# Patient Record
Sex: Female | Born: 1950 | Race: Black or African American | Hispanic: No | Marital: Married | Smoking: Never smoker
Health system: Southern US, Community
[De-identification: ages and names within clinical notes are randomized; demographics above are authoritative.]

## PROBLEM LIST (undated history)

## (undated) DIAGNOSIS — I10 Essential (primary) hypertension: Secondary | ICD-10-CM

## (undated) DIAGNOSIS — N289 Disorder of kidney and ureter, unspecified: Secondary | ICD-10-CM

## (undated) DIAGNOSIS — M549 Dorsalgia, unspecified: Secondary | ICD-10-CM

## (undated) DIAGNOSIS — M2550 Pain in unspecified joint: Secondary | ICD-10-CM

## (undated) DIAGNOSIS — G4730 Sleep apnea, unspecified: Secondary | ICD-10-CM

## (undated) DIAGNOSIS — R0602 Shortness of breath: Secondary | ICD-10-CM

## (undated) DIAGNOSIS — J45909 Unspecified asthma, uncomplicated: Secondary | ICD-10-CM

## (undated) DIAGNOSIS — M25569 Pain in unspecified knee: Secondary | ICD-10-CM

## (undated) DIAGNOSIS — T8859XA Other complications of anesthesia, initial encounter: Secondary | ICD-10-CM

## (undated) DIAGNOSIS — M1990 Unspecified osteoarthritis, unspecified site: Secondary | ICD-10-CM

## (undated) DIAGNOSIS — E669 Obesity, unspecified: Secondary | ICD-10-CM

## (undated) DIAGNOSIS — D869 Sarcoidosis, unspecified: Secondary | ICD-10-CM

## (undated) DIAGNOSIS — R0600 Dyspnea, unspecified: Secondary | ICD-10-CM

## (undated) DIAGNOSIS — R011 Cardiac murmur, unspecified: Secondary | ICD-10-CM

## (undated) DIAGNOSIS — T7840XA Allergy, unspecified, initial encounter: Secondary | ICD-10-CM

## (undated) HISTORY — DX: Dorsalgia, unspecified: M54.9

## (undated) HISTORY — DX: Obesity, unspecified: E66.9

## (undated) HISTORY — PX: CHOLECYSTECTOMY: SHX55

## (undated) HISTORY — DX: Disorder of kidney and ureter, unspecified: N28.9

## (undated) HISTORY — PX: ABDOMINAL HYSTERECTOMY: SHX81

## (undated) HISTORY — DX: Pain in unspecified knee: M25.569

## (undated) HISTORY — DX: Sleep apnea, unspecified: G47.30

## (undated) HISTORY — DX: Allergy, unspecified, initial encounter: T78.40XA

## (undated) HISTORY — DX: Unspecified osteoarthritis, unspecified site: M19.90

## (undated) HISTORY — DX: Pain in unspecified joint: M25.50

## (undated) HISTORY — PX: KNEE SURGERY: SHX244

## (undated) HISTORY — PX: BUNIONECTOMY: SHX129

## (undated) HISTORY — DX: Shortness of breath: R06.02

---

## 1999-08-04 ENCOUNTER — Ambulatory Visit (HOSPITAL_COMMUNITY): Admission: RE | Admit: 1999-08-04 | Discharge: 1999-08-04 | Payer: Self-pay | Admitting: Internal Medicine

## 1999-08-05 ENCOUNTER — Encounter: Payer: Self-pay | Admitting: Internal Medicine

## 2000-03-24 ENCOUNTER — Encounter: Admission: RE | Admit: 2000-03-24 | Discharge: 2000-03-24 | Payer: Self-pay | Admitting: Obstetrics & Gynecology

## 2000-03-24 ENCOUNTER — Encounter: Payer: Self-pay | Admitting: Obstetrics & Gynecology

## 2000-05-30 ENCOUNTER — Other Ambulatory Visit: Admission: RE | Admit: 2000-05-30 | Discharge: 2000-05-30 | Payer: Self-pay | Admitting: Obstetrics & Gynecology

## 2000-06-13 ENCOUNTER — Encounter (INDEPENDENT_AMBULATORY_CARE_PROVIDER_SITE_OTHER): Payer: Self-pay

## 2000-06-13 ENCOUNTER — Other Ambulatory Visit: Admission: RE | Admit: 2000-06-13 | Discharge: 2000-06-13 | Payer: Self-pay | Admitting: *Deleted

## 2000-09-09 ENCOUNTER — Encounter: Admission: RE | Admit: 2000-09-09 | Discharge: 2000-09-09 | Payer: Self-pay | Admitting: Internal Medicine

## 2000-09-09 ENCOUNTER — Encounter: Payer: Self-pay | Admitting: Internal Medicine

## 2000-09-21 ENCOUNTER — Encounter (INDEPENDENT_AMBULATORY_CARE_PROVIDER_SITE_OTHER): Payer: Self-pay

## 2000-09-21 ENCOUNTER — Encounter: Payer: Self-pay | Admitting: Obstetrics & Gynecology

## 2000-09-21 ENCOUNTER — Inpatient Hospital Stay (HOSPITAL_COMMUNITY): Admission: RE | Admit: 2000-09-21 | Discharge: 2000-09-24 | Payer: Self-pay | Admitting: *Deleted

## 2002-04-13 ENCOUNTER — Other Ambulatory Visit: Admission: RE | Admit: 2002-04-13 | Discharge: 2002-04-13 | Payer: Self-pay | Admitting: Obstetrics and Gynecology

## 2002-06-18 ENCOUNTER — Encounter: Admission: RE | Admit: 2002-06-18 | Discharge: 2002-06-18 | Payer: Self-pay | Admitting: Internal Medicine

## 2002-06-18 ENCOUNTER — Encounter: Payer: Self-pay | Admitting: Internal Medicine

## 2003-12-16 ENCOUNTER — Other Ambulatory Visit: Admission: RE | Admit: 2003-12-16 | Discharge: 2003-12-16 | Payer: Self-pay | Admitting: Obstetrics and Gynecology

## 2004-01-02 ENCOUNTER — Encounter: Admission: RE | Admit: 2004-01-02 | Discharge: 2004-01-02 | Payer: Self-pay | Admitting: Obstetrics and Gynecology

## 2004-10-19 ENCOUNTER — Encounter: Admission: RE | Admit: 2004-10-19 | Discharge: 2004-10-19 | Payer: Self-pay | Admitting: Internal Medicine

## 2004-11-12 ENCOUNTER — Encounter: Admission: RE | Admit: 2004-11-12 | Discharge: 2004-11-12 | Payer: Self-pay | Admitting: Internal Medicine

## 2005-03-05 ENCOUNTER — Other Ambulatory Visit: Admission: RE | Admit: 2005-03-05 | Discharge: 2005-03-05 | Payer: Self-pay | Admitting: Obstetrics and Gynecology

## 2006-03-08 ENCOUNTER — Other Ambulatory Visit: Admission: RE | Admit: 2006-03-08 | Discharge: 2006-03-08 | Payer: Self-pay | Admitting: Obstetrics and Gynecology

## 2007-07-18 ENCOUNTER — Encounter: Admission: RE | Admit: 2007-07-18 | Discharge: 2007-07-18 | Payer: Self-pay | Admitting: Internal Medicine

## 2007-09-29 ENCOUNTER — Encounter: Admission: RE | Admit: 2007-09-29 | Discharge: 2007-09-29 | Payer: Self-pay | Admitting: Internal Medicine

## 2008-11-19 ENCOUNTER — Encounter: Admission: RE | Admit: 2008-11-19 | Discharge: 2008-11-19 | Payer: Self-pay | Admitting: Internal Medicine

## 2008-11-22 ENCOUNTER — Ambulatory Visit: Payer: Self-pay | Admitting: Internal Medicine

## 2009-04-12 ENCOUNTER — Emergency Department (HOSPITAL_COMMUNITY): Admission: EM | Admit: 2009-04-12 | Discharge: 2009-04-12 | Payer: Self-pay | Admitting: Emergency Medicine

## 2009-11-20 ENCOUNTER — Encounter: Admission: RE | Admit: 2009-11-20 | Discharge: 2009-11-20 | Payer: Self-pay | Admitting: Internal Medicine

## 2009-12-25 ENCOUNTER — Ambulatory Visit: Payer: Self-pay | Admitting: Internal Medicine

## 2010-12-06 ENCOUNTER — Encounter: Payer: Self-pay | Admitting: Internal Medicine

## 2010-12-11 ENCOUNTER — Other Ambulatory Visit: Payer: Self-pay | Admitting: Internal Medicine

## 2010-12-11 DIAGNOSIS — Z1239 Encounter for other screening for malignant neoplasm of breast: Secondary | ICD-10-CM

## 2010-12-11 DIAGNOSIS — Z1231 Encounter for screening mammogram for malignant neoplasm of breast: Secondary | ICD-10-CM

## 2010-12-23 ENCOUNTER — Ambulatory Visit
Admission: RE | Admit: 2010-12-23 | Discharge: 2010-12-23 | Disposition: A | Discharge status: Home / Self Care | Payer: BC Managed Care – PPO | Source: Ambulatory Visit | Attending: Internal Medicine | Admitting: Internal Medicine

## 2010-12-23 DIAGNOSIS — Z1231 Encounter for screening mammogram for malignant neoplasm of breast: Secondary | ICD-10-CM

## 2011-01-25 ENCOUNTER — Ambulatory Visit: Payer: BC Managed Care – PPO | Admitting: Internal Medicine

## 2011-01-25 DIAGNOSIS — J209 Acute bronchitis, unspecified: Secondary | ICD-10-CM

## 2011-02-03 ENCOUNTER — Other Ambulatory Visit: Payer: Self-pay | Admitting: Internal Medicine

## 2011-02-03 ENCOUNTER — Ambulatory Visit
Admission: RE | Admit: 2011-02-03 | Discharge: 2011-02-03 | Disposition: A | Discharge status: Home / Self Care | Payer: BC Managed Care – PPO | Source: Ambulatory Visit | Attending: Internal Medicine | Admitting: Internal Medicine

## 2011-02-03 DIAGNOSIS — D869 Sarcoidosis, unspecified: Secondary | ICD-10-CM

## 2011-02-23 LAB — URINALYSIS, ROUTINE W REFLEX MICROSCOPIC
Bilirubin Urine: NEGATIVE
Ketones, ur: NEGATIVE mg/dL
Nitrite: NEGATIVE
Protein, ur: NEGATIVE mg/dL
pH: 5.5 (ref 5.0–8.0)

## 2011-02-23 LAB — DIFFERENTIAL
Basophils Absolute: 0 10*3/uL (ref 0.0–0.1)
Basophils Relative: 1 % (ref 0–1)
Eosinophils Absolute: 0.2 10*3/uL (ref 0.0–0.7)
Eosinophils Relative: 2 % (ref 0–5)
Neutrophils Relative %: 66 % (ref 43–77)

## 2011-02-23 LAB — CBC
HCT: 36.9 % (ref 36.0–46.0)
MCV: 78.2 fL (ref 78.0–100.0)
Platelets: 255 10*3/uL (ref 150–400)
RDW: 14.7 % (ref 11.5–15.5)
WBC: 8.1 10*3/uL (ref 4.0–10.5)

## 2011-02-23 LAB — BASIC METABOLIC PANEL
BUN: 20 mg/dL (ref 6–23)
Chloride: 108 mEq/L (ref 96–112)
Creatinine, Ser: 0.95 mg/dL (ref 0.4–1.2)
Glucose, Bld: 133 mg/dL — ABNORMAL HIGH (ref 70–99)
Potassium: 3.8 mEq/L (ref 3.5–5.1)

## 2011-03-23 ENCOUNTER — Telehealth: Payer: Self-pay | Admitting: Internal Medicine

## 2011-03-23 NOTE — Telephone Encounter (Signed)
Pt will not be able to get in as new pt for several weeks. Vaughan Basta will get a referral. Has been 3 years since seen there at [facility].

## 2011-03-26 ENCOUNTER — Telehealth: Payer: Self-pay | Admitting: Internal Medicine

## 2011-03-26 NOTE — Telephone Encounter (Signed)
Noted and referral sent to [facility] neuro

## 2011-03-26 NOTE — Telephone Encounter (Signed)
Noted and referral sent

## 2011-04-02 NOTE — H&P (Signed)
[facility]  Patient:    Kristine [name], Kristine [name]                      MRN: [id number] Attending:  Peterson Lombard, M.D.                         History and Physical  DATE OF BIRTH:                October 20, 1951.  HISTORY:                      Patient is a 60 year old gravida 2, para 2, who has a rapidly enlarged fibroid uterus.  Patient was seen for pelvic examination in December 1999 and was noted to have a normal-size uterus.  The patient noticed since December that her periods have become heavier.  She has a two-day period where she must change a pad every hour.  She has had two accidents while in church.  Her periods are also lasting a total of seven to eight days, which is new for her.  Patient was evaluated and found to have an enlarged fibroid uterus, approximately 5 cm above the umbilicus.  Patient was initially scheduled for total abdominal hysterectomy in September.  Patient had bronchitis at that time and secondary to her history of sarcoidosis, her surgery was rescheduled to ______  7th.  ALLERGIES:                    Patient had reaction to PENICILLIN prior to the birth of her children.  She states that her hands "felt hot and peeled."  MEDICATIONS:                  Prinzide for hypertension and Meridia for weight loss.  PAST MEDICAL HISTORY:         Medical illnesses include hypertension and sarcoidosis.  PAST SURGICAL HISTORY:        Cholecystectomy and tubal ligation.  FAMILY HISTORY:               Patients brother had hypertension; he died two years ago of liver cancer.  Patients mother has had lung cancer and breast cancer; patients father also has lung cancer.  SOCIAL HISTORY:               Patient denies tobacco, alcohol or recreational drug use.  She is in a stable marital relationship.  REVIEW OF SYSTEMS:            Noncontributory.  PHYSICAL EXAMINATION  VITAL SIGNS:                  Vital signs are stable.  Weight  243 pounds. Height 5 feet 7 inches.  HEENT:                        Within normal limits.  LUNGS:                        Clear to auscultation bilaterally.  HEART:                        Regular rate and rhythm.  ABDOMEN:                      Obese, soft, nontender, with a mass palpated 5  cm above the umbilicus.  BACK:                         No CVA tenderness.  BREASTS:                      No dominant masses, discharge, skin changes or nipple retraction.  EXTREMITIES:                  No clubbing, cyanosis, or edema.  PELVIC:                       Normal external female genitalia.  Vagina is without discharge.  Cervix without lesion.  Uterus is well above the umbilicus, mobile and irregular.  Adnexa difficult to palpate secondary to enlarged uterus.  RECTAL:                       Confirms.  LABORATORY DATA:              TSH is within normal limits.  Prolactin is 16.7. Endometrial biopsy showed proliferative-type endometrium.  GYN ultrasound showed multiple fibroids, with the largest measuring 6.2 cm at the fundus.  Neither ovary could be seen.  Endometrial lining was 1.2 cm.  Mammogram from May 2001 within normal limits.  Pap smear from July 2001 was satisfactory but limited by the absence of an endocervical component.  Hemoglobin from September 10th was 11.4, hematocrit 34.6, platelet count 329,000.  Comprehensive metabolic panel was within normal limits.  ASSESSMENT:                   1. Menorrhagia.                               2. Enlarged fibroid uterus.  PLAN:                         Patient has been consented for a total abdominal hysterectomy.  She desires ovarian conservation.  Risks, benefits and alternatives were reviewed which included the risk of bleeding, risk of infection, risk of damage to other internal organs such as bowel, bladder or ureter; I also reviewed the risk of DVT formation and its prophylaxis and briefly reviewed anesthesia complications.   Patient has been cleared by her family physician concerning her lung status. DD:  09/21/00 TD:  09/21/00 Job: [id number]

## 2011-04-02 NOTE — H&P (Signed)
[facility]  Patient:    Kristine [name], Kristine [name]                      MRN: [id number] Attending:  Peterson Lombard, M.D.                         History and Physical  DATE OF BIRTH:                1951-03-09  HISTORY:                      The patient is a 60 year old gravida 2, para 2, who has a rapidly enlarged fibroid uterus.  The patient was seen for pelvic examination in December 1999 and was noted to have a normal size uterus.  The patient noted since December that her periods have become heavier.  She has a two-day period where she must change a pad every hour.  She has had two accidents while in church.  Her periods are also lasting seven to eight days which is new for her.  The patient was evaluated and found to have an enlarged fibroid uterus approximately 5 cm above the umbilicus.  The patient was therefore scheduled for a total abdominal hysterectomy.  PAST MEDICAL HISTORY:         The patient had a reaction to penicillin prior to the birth of her children.  She states her hands felt hot and peeled.  MEDICATIONS:                  Prinzide for hypertension and Meridia for weight loss.  MEDICAL ILLNESSES:            Hypertension and sarcoidosis.  PAST SURGICAL HISTORY:        Cholecystectomy and a tubal.  FAMILY HISTORY:               The patients brother had hypertension.  He died two years ago of liver cancer.  The patients mother has had lung cancer and breast cancer.  The patients father also had lung cancer.  SOCIAL HISTORY:               The patient denies tobacco, alcohol or recreational drug use. She is in a stable marital relationship.  REVIEW OF SYSTEMS:            The patient complains of hurting in her left chest and arm for the past month.  This occurs on occasion.  She associates to the air conditioner.  She denies shortness of breath.  PHYSICAL EXAMINATION:  VITAL SIGNS:                  Blood pressure 118/70,  weight 243 pounds, height 5 feet 7 inches.  HEENT:                        Within normal limits.  NECK:                         Supple without lymphadenopathy.  LUNGS:                        Clear to auscultation bilaterally.  HEART:                        Regular  rate and rhythm.  BREASTS:                      Reveal no dominant masses, discharge, skin changes or nipple retraction.  BACK:                         Reveals no CVA tenderness.  ABDOMEN:                      There is a mass palpated 5 cm above the umbilicus.  EXTREMITIES:                  Reveal no clubbing, cyanosis, or edema.  PELVIC:                       External genitalia normal female.  Vagina is without discharge.  Cervix without lesion.  Uterus is well above the umbilicus, mobile and regular.  Adnexa reveal no masses that are nontender.  RECTAL:                       Confirms.  LABORATORY DATA:              TSH is within normal limits.  Prolactin 39.8. Endometrial biopsy shows proliferative-type endometrium.  GYN ultrasound shows multiple fibroids with the largest measuring 6.2 cm at its fundal.  Neither ovaries were seen.  Endometrial lining was 1.2 cm. Mammogram from May 2001 was within normal limits.  Pap smear from July 2001 was satisfactory, but limited by the absence of an endocervical component.  ASSESSMENT:                   1. Menorrhagia.                               2. Enlarged fibroid uterus.                               3. Minimally elevated prolactin.  PLAN:                         The patient has been consented for a total abdominal hysterectomy.  She desires ovarian conservation.  Risks, benefits and alternatives were reviewed which included the risk of bleeding, risk of infection, risk of damage to other internal organs such as bowel, bladder or ureter.  Also, reviewed the risk of DVT, formation and its prophylaxis. Briefly reviewed anesthesia complication.  Secondary to the patients  history of chest discomfort, EKG has been ordered, preoperative labs have been drawn. The patient will have the procedure performed on September 17 as scheduled. DD:  07/25/00 TD:  07/25/00 Job: [id number]

## 2011-04-02 NOTE — Discharge Summary (Signed)
[facility]  Patient:    Kristine [name], Kristine [name]                    MRN: [id number] Adm. Date:  [id number] Disc. Date: [id number] Attending:  Blanca Friend                           Discharge Summary  DISCHARGE DIAGNOSES:          Fibroid uterus, menorrhagia.  PROCEDURE:                    Total abdominal hysterectomy, right salpingo-oophorectomy.  HISTORY:                      Patient is a 60 year old gravida 2, para 2 who was consented for total abdominal hysterectomy secondary to rapidly enlarging fibroid uterus.  Patient had an uneventful surgical procedure on November 7.  HOSPITAL COURSE:              Essentially unremarkable.  She was voiding well, ambulating well, and tolerating p.o. well at the time of her discharge postoperative day #3.  Her bleeding was minimal.  Her incision was clean, dry, and intact.  At this point there were no contraindications to her being discharged.  PHYSICAL EXAMINATION:  VITAL SIGNS:                  Stable.  Patient was afebrile.  ABDOMEN:                      Soft, nontender.  Incision was clean, dry, and intact.  EXTREMITIES:                  No edema.  LABORATORIES:                 White count 12.0, hemoglobin 9.6, hematocrit 27.9, platelets 244.  PATHOLOGY:                    Cervix with no pathologic abnormalities, benign secretory endometrium, leiomyomata uteri, fallopian tube and ovary with no pathologic abnormality identified.  DISCHARGE MEDICATIONS:        1. Tylox one to two q.3-4h. as needed.                               2. Ibuprofen 600 mg q.6h. as needed.  ACTIVITY:                     As per the [facility] instruction sheet.  FOLLOW-UP:                    November 14 for staple removal and then again in six weeks. DD:  12/13/00 TD:  12/13/00 Job: [id number]

## 2011-04-02 NOTE — Op Note (Signed)
[facility]  Patient:    BRENDA, SAMANO                    MRN: [id number] Proc. Date: 09/21/00 Adm. Date:  [id number] Attending:  Blanca Friend                           Operative Report  PREOPERATIVE DIAGNOSES:       Menorrhagia, fibroid uterus.  SURGEON:                      Peterson Lombard, M.D.  ASSISTANT:                    Eli Hose, M.D.  PROCEDURE:                    Total abdominal hysterectomy, right salpingo-oophorectomy.  ANESTHESIA:                   General.  ESTIMATED BLOOD LOSS:         200 cc.  URINE OUTPUT:                 150 cc.  FINDINGS:                     Enlarged fibroid uterus.  COMPLICATIONS:                Count of the needles at the end of the procedure did not correlate.  Abdominal x-ray was negative.  HISTORY:                      Patient is a 60 year old gravida 2, para 2 who was noted to have an enlarged fibroid uterus.  Patient was therefore consented for abdominal hysterectomy.  Patient stated that should her ovaries look completely normal she desired to have them left in situ.  If anything unusual appeared on her ovaries patient desired to have them removed.  DESCRIPTION OF PROCEDURE:     After adequate level of general anesthesia was obtained patient was prepped and draped in a sterile fashion and a Foley was placed inside the bladder to drain it of urine.  A midline incision was made to just below the umbilicus and subcutaneous fat was dissected with the Bovie. The fascia was identified and incised with the Bovie and the incision was carried superiorly and inferiorly with the Mayo scissors.  Parietal peritoneum was grasped with hemostats and incised with Metzenbaum scissors.  This incision was carried superiorly and inferiorly taking care not to damage the bowel or the bladder.  The uterus was delivered and Deaver retractors were placed inside the incision.  The round  ligaments bilaterally were grasped with Kellys, suture ligated, and cut.  The bladder flap was developed.  A defect was made into the right broad ligament and Kelly clamp was placed across the infundibulopelvic ligament.  A second clamp was placed as well and the infundibulopelvic ligament was transected.  This was made hemostatic with a free tie as well as a suture ligature.  Hemostasis was assured.  The right utero-ovarian pedicle was cross clamped with double Claiborne Billings, cut, and made hemostatic with a free tie and a suture ligature.  The uterine arteries were skeletonized.  Uterine arteries were then cross clamped bilaterally, cut, and suture ligated.  Uterosacral cardinal ligament complexes then cross clamped  bilaterally, cut, and suture ligated.  The fundus of the uterus was then removed and the cervix was grasped with Kocher clamps.  The cervical branches of the uterine artery were then progressively cross clamped, cut, and suture ligated until the base of the cervix could be felt.  At that point the uterus was entered with the scalpel and the cervix was removed with Satinsky scissors.  The vaginal cuff was then grasped with Kocher clamps.  Angle sutures were placed at the 3 and 9 oclock position taking care to incorporate the uterosacral ligaments.  The cuff was then closed with figure-of-eight of 0 Vicryl.  There was a small amount of oozing from the posterior portion of the vaginal cuff that was made hemostatic with a figure-of-eight of 0 Vicryl. There was some bleeding from the right ovarian pedicle that was made hemostatic with figure-of-eight of 0 Vicryl.  Pelvis was copiously irrigated at this point.  Hemostasis was assured.  ______ were palpated bilaterally. The bowel had previously been packed to assure visualization and these packs were removed without difficulty.  The fascia and peritoneum were then reapproximated from the superior edge to the midline and from the inferior edge  to the midline with a running suture of #1 PDS.  The subcuticular layer was closed with 3-0 Vicryl.  Skin staples were applied.  The count of needles was incorrect so x-ray was performed.  This proved to be negative.  Otherwise, needle, sponge, and other instrument counts were correct.  Patient tolerated procedure well.  She was taken to recovery room in stable condition.  POSTOPERATIVE DIAGNOSES:      Menorrhagia, fibroid uterus. DD:  09/21/00 TD:  09/21/00 Job: [id number]

## 2011-04-08 ENCOUNTER — Telehealth: Payer: Self-pay | Admitting: Internal Medicine

## 2011-04-08 MED ORDER — HYDROCODONE-ACETAMINOPHEN 5-325 MG PO TABS: 1.0000 | ORAL_TABLET | Freq: Four times a day (QID) | ORAL | Status: DC | PRN

## 2011-04-08 NOTE — Telephone Encounter (Signed)
Addended by: Annia Belt on: 04/08/2011 03:07 PM   Modules accepted: Orders

## 2011-04-08 NOTE — Telephone Encounter (Signed)
Pt cannot see neurologist before late June. Sounds like recurrent muscle contraction headaches. Try instead of Ibuprofen, Hydrocodone APAP 5/325 #40 one po q 6 hours prn pain with no refill.

## 2011-04-08 NOTE — Telephone Encounter (Signed)
Pull chart for me. I think she was here for this previously .

## 2011-04-08 NOTE — Telephone Encounter (Signed)
Left voice mail for pt to pick up rx for Vicodin.  LMS

## 2011-04-16 ENCOUNTER — Telehealth: Payer: Self-pay | Admitting: Internal Medicine

## 2011-04-16 ENCOUNTER — Ambulatory Visit: Payer: BC Managed Care – PPO | Admitting: Internal Medicine

## 2011-04-16 NOTE — Telephone Encounter (Signed)
MD aware.

## 2011-09-28 ENCOUNTER — Telehealth: Payer: Self-pay

## 2011-09-28 ENCOUNTER — Emergency Department (HOSPITAL_COMMUNITY): Payer: BC Managed Care – PPO

## 2011-09-28 ENCOUNTER — Emergency Department (HOSPITAL_COMMUNITY)
Admission: EM | Admit: 2011-09-28 | Discharge: 2011-09-28 | Disposition: A | Discharge status: Home / Self Care | Payer: BC Managed Care – PPO | Attending: Emergency Medicine | Admitting: Emergency Medicine

## 2011-09-28 ENCOUNTER — Encounter: Payer: Self-pay | Admitting: *Deleted

## 2011-09-28 DIAGNOSIS — R10812 Left upper quadrant abdominal tenderness: Secondary | ICD-10-CM | POA: Insufficient documentation

## 2011-09-28 DIAGNOSIS — M549 Dorsalgia, unspecified: Secondary | ICD-10-CM | POA: Insufficient documentation

## 2011-09-28 DIAGNOSIS — H409 Unspecified glaucoma: Secondary | ICD-10-CM | POA: Insufficient documentation

## 2011-09-28 DIAGNOSIS — R109 Unspecified abdominal pain: Secondary | ICD-10-CM | POA: Insufficient documentation

## 2011-09-28 DIAGNOSIS — I10 Essential (primary) hypertension: Secondary | ICD-10-CM | POA: Insufficient documentation

## 2011-09-28 HISTORY — DX: Essential (primary) hypertension: I10

## 2011-09-28 HISTORY — DX: Sarcoidosis, unspecified: D86.9

## 2011-09-28 LAB — COMPREHENSIVE METABOLIC PANEL
ALT: 19 U/L (ref 0–35)
AST: 19 U/L (ref 0–37)
Alkaline Phosphatase: 64 U/L (ref 39–117)
CO2: 27 mEq/L (ref 19–32)
Calcium: 10 mg/dL (ref 8.4–10.5)
Chloride: 103 mEq/L (ref 96–112)
GFR calc non Af Amer: 74 mL/min — ABNORMAL LOW (ref >90)
Potassium: 3.7 mEq/L (ref 3.5–5.1)
Sodium: 139 mEq/L (ref 135–145)

## 2011-09-28 LAB — URINALYSIS, ROUTINE W REFLEX MICROSCOPIC
Bilirubin Urine: NEGATIVE
Hgb urine dipstick: NEGATIVE
Ketones, ur: NEGATIVE mg/dL
Specific Gravity, Urine: 1.021 (ref 1.005–1.030)
Urobilinogen, UA: 0.2 mg/dL (ref 0.0–1.0)

## 2011-09-28 LAB — DIFFERENTIAL
Basophils Absolute: 0 10*3/uL (ref 0.0–0.1)
Eosinophils Relative: 2 % (ref 0–5)
Lymphocytes Relative: 19 % (ref 12–46)
Lymphs Abs: 1.6 10*3/uL (ref 0.7–4.0)
Neutro Abs: 6.1 10*3/uL (ref 1.7–7.7)
Neutrophils Relative %: 72 % (ref 43–77)

## 2011-09-28 LAB — CBC
Platelets: 251 10*3/uL (ref 150–400)
RBC: 4.82 MIL/uL (ref 3.87–5.11)
RDW: 14.1 % (ref 11.5–15.5)
WBC: 8.4 10*3/uL (ref 4.0–10.5)

## 2011-09-28 MED ORDER — IOHEXOL 300 MG/ML  SOLN
100.0000 mL | Freq: Once | INTRAMUSCULAR | Status: AC | PRN
  Administered 2011-09-28: 100 mL via INTRAVENOUS

## 2011-09-28 MED ORDER — HYDROCODONE-ACETAMINOPHEN 5-325 MG PO TABS: 2.0000 | ORAL_TABLET | ORAL | Status: AC | PRN

## 2011-09-28 MED ORDER — SODIUM CHLORIDE 0.9 % IV SOLN
999.0000 mL | Freq: Once | INTRAVENOUS | Status: AC
  Administered 2011-09-28: 1000 mL via INTRAVENOUS

## 2011-09-28 MED ORDER — HYDROMORPHONE HCL PF 1 MG/ML IJ SOLN
1.0000 mg | Freq: Once | INTRAMUSCULAR | Status: AC
  Administered 2011-09-28: 0.5 mg via INTRAVENOUS
  Filled 2011-09-28: qty 1

## 2011-09-28 MED ORDER — HYDROMORPHONE HCL PF 1 MG/ML IJ SOLN
INTRAMUSCULAR | Status: AC
  Administered 2011-09-28: 0.5 mg via INTRAVENOUS
  Filled 2011-09-28: qty 1

## 2011-09-28 MED ORDER — HYDROMORPHONE HCL PF 1 MG/ML IJ SOLN
0.5000 mg | Freq: Once | INTRAMUSCULAR | Status: AC
  Administered 2011-09-28: 0.5 mg via INTRAVENOUS

## 2011-09-28 MED ORDER — MORPHINE SULFATE 4 MG/ML IJ SOLN
4.0000 mg | Freq: Once | INTRAMUSCULAR | Status: AC
  Administered 2011-09-28: 4 mg via INTRAVENOUS
  Filled 2011-09-28: qty 1

## 2011-09-28 NOTE — ED Notes (Signed)
Pt c/o upper abd pain & "catching" pain in the back for approx 1 month, primary MD is Baxley, who sent her here.  Pt denies n/v, shortness of breath, sweating

## 2011-09-28 NOTE — ED Provider Notes (Signed)
History    patient presents complaining of abdominal pain. She states having abdominal pain for the past month. Described pain as sharp, cramping, and initially locating her left lower quadrant. Pain usually lasted for seconds to minutes. However today the pain is ongoing and is radiating up to the epigastric region. Pain is also radiating to the back.  She denies change in bowel habit. She denies fever, headache, chest pain, shortness of breath, or dysuria.    CSN: [id number] Arrival date & time: 09/28/2011  1:14 PM   First MD Initiated Contact with Patient 09/28/11 1431      Chief Complaint  Patient presents with  . Abdominal Pain    (Consider location/radiation/quality/duration/timing/severity/associated sxs/prior treatment) HPI  Past Medical History  Diagnosis Date  . Hypertension   . Sarcoidosis   . Glaucoma     ROD    Past Surgical History  Procedure Date  . Cholecystectomy   . Abdominal hysterectomy   . Knee surgery     right  . Bunionectomy     left    No family history on file.  History  Substance Use Topics  . Smoking status: Never Smoker   . Smokeless tobacco: Not on file  . Alcohol Use: No    OB History    Grav Para Term Preterm Abortions TAB SAB Ect Mult Living                  Review of Systems  Allergies  Review of patient's allergies indicates no known allergies.  Home Medications   Current Outpatient Rx  Name Route Sig Dispense Refill  . BRIMONIDINE TARTRATE 0.1 % OP SOLN Right Eye Place 1 drop into the right eye 2 (two) times daily.      Marland Kitchen BRINZOLAMIDE 1 % OP SUSP Right Eye Place 1 drop into the right eye 2 (two) times daily.      Marland Kitchen LISINOPRIL 20 MG PO TABS Oral Take 20 mg by mouth daily.      Marland Kitchen PREDNISOLONE ACETATE 1 % OP SUSP Right Eye Place 1 drop into the right eye 2 (two) times daily.        BP 151/88  Pulse 77  Temp(Src) 98 F (36.7 C) (Oral)  Resp 16  Wt 260 lb (117.935 kg)  SpO2 98%  Physical Exam  Nursing note and  vitals reviewed. Constitutional: She is oriented to person, place, and time.       Awake, alert, nontoxic appearance  HENT:  Head: Atraumatic.  Eyes: Right eye exhibits no discharge. Left eye exhibits no discharge.  Neck: Neck supple.  Pulmonary/Chest: Effort normal. She exhibits no tenderness.  Abdominal: Soft. Bowel sounds are normal. There is tenderness. There is no rebound and no guarding.       Diffused abd tenderness, specifically to LLQ.  No guarding, no rebound tenderness.    Musculoskeletal: She exhibits no tenderness.       Baseline ROM, no obvious new focal weakness  Neurological: She is alert and oriented to person, place, and time.       Mental status and motor strength appears baseline for patient and situation  Skin: No rash noted.  Psychiatric: She has a normal mood and affect.    ED Course  Procedures (including critical care time)  Labs Reviewed - No data to display No results found.   No diagnosis found.    MDM  Evaluations and lab work for patient's abdominal pain is essentially normal her abdominal CT scan  shows no acute finding. Some evidence of constipation was noted.  I recommend pt to f/u with PCP for further evaluation.  Pt voice understanding and agreeing with plan.  I have discussed with my attending who agrees with plan.          Domenic Moras, PA 09/28/11 [phone]

## 2011-09-28 NOTE — Telephone Encounter (Signed)
Patient's daughter comes to the front desk today at 1:00pm, with mom outside in the car with excruciating lower abdominal and pelvic pain, radiating around to back. No fever, no nausea or vomiting although they feel like it may be a kidney stone. Dr. Renold Genta consulted and they were advised to go directly to [facility] ED.Agreeable

## 2011-09-29 NOTE — ED Provider Notes (Signed)
Medical screening examination/treatment/procedure(s) were performed by non-physician practitioner and as supervising physician I was immediately available for consultation/collaboration.   Lezlie Octave, MD 09/29/11 [phone]

## 2011-09-30 ENCOUNTER — Encounter: Payer: Self-pay | Admitting: Internal Medicine

## 2011-09-30 ENCOUNTER — Ambulatory Visit (INDEPENDENT_AMBULATORY_CARE_PROVIDER_SITE_OTHER): Payer: BC Managed Care – PPO | Admitting: Internal Medicine

## 2011-09-30 DIAGNOSIS — J45909 Unspecified asthma, uncomplicated: Secondary | ICD-10-CM | POA: Insufficient documentation

## 2011-09-30 DIAGNOSIS — I10 Essential (primary) hypertension: Secondary | ICD-10-CM

## 2011-09-30 DIAGNOSIS — K219 Gastro-esophageal reflux disease without esophagitis: Secondary | ICD-10-CM

## 2011-09-30 DIAGNOSIS — M5416 Radiculopathy, lumbar region: Secondary | ICD-10-CM

## 2011-09-30 DIAGNOSIS — M1990 Unspecified osteoarthritis, unspecified site: Secondary | ICD-10-CM

## 2011-09-30 DIAGNOSIS — D860 Sarcoidosis of lung: Secondary | ICD-10-CM | POA: Insufficient documentation

## 2011-09-30 DIAGNOSIS — M549 Dorsalgia, unspecified: Secondary | ICD-10-CM

## 2011-09-30 DIAGNOSIS — H3090 Unspecified chorioretinal inflammation, unspecified eye: Secondary | ICD-10-CM

## 2011-09-30 DIAGNOSIS — D869 Sarcoidosis, unspecified: Secondary | ICD-10-CM | POA: Insufficient documentation

## 2011-09-30 DIAGNOSIS — E049 Nontoxic goiter, unspecified: Secondary | ICD-10-CM

## 2011-09-30 DIAGNOSIS — IMO0002 Reserved for concepts with insufficient information to code with codable children: Secondary | ICD-10-CM

## 2011-09-30 DIAGNOSIS — H3093 Unspecified chorioretinal inflammation, bilateral: Secondary | ICD-10-CM

## 2011-09-30 LAB — POCT URINALYSIS DIPSTICK
Blood, UA: NEGATIVE
Glucose, UA: NEGATIVE
Spec Grav, UA: >=1.03
Urobilinogen, UA: NEGATIVE
pH, UA: 5.5

## 2011-11-08 ENCOUNTER — Telehealth: Payer: Self-pay | Admitting: Internal Medicine

## 2011-11-08 ENCOUNTER — Encounter: Payer: Self-pay | Admitting: Internal Medicine

## 2011-11-08 NOTE — Telephone Encounter (Signed)
Please arrange lumbar MRI for radiculopathy.

## 2011-11-11 ENCOUNTER — Telehealth: Payer: Self-pay

## 2011-11-11 ENCOUNTER — Other Ambulatory Visit: Payer: Self-pay | Admitting: Internal Medicine

## 2011-11-11 DIAGNOSIS — E6609 Other obesity due to excess calories: Secondary | ICD-10-CM | POA: Insufficient documentation

## 2011-11-11 DIAGNOSIS — H3093 Unspecified chorioretinal inflammation, bilateral: Secondary | ICD-10-CM | POA: Insufficient documentation

## 2011-11-11 DIAGNOSIS — M5410 Radiculopathy, site unspecified: Secondary | ICD-10-CM

## 2011-11-11 DIAGNOSIS — E66811 Obesity, class 1: Secondary | ICD-10-CM | POA: Insufficient documentation

## 2011-11-11 NOTE — Patient Instructions (Signed)
Take pain medication as directed. Take Sterapred dosepak as directed. Use Flexeril at night for muscle relaxant. Call if not better next week or sooner if worse.

## 2011-11-11 NOTE — Telephone Encounter (Signed)
Patient scheduled for MRI LS spine on Saturday 11/14/2011 at 12:30pm at [place]. Waiting on precert from [facility]. Patient aware of this.

## 2011-11-11 NOTE — Progress Notes (Signed)
  Subjective:    Patient ID: Kristine [name], female    DOB: [birth year], 60 y.o.   MRN: [id number]  HPI 60 year old morbidly obese black female with history of hypertension, asthma, goiter, sarcoidosis, GE reflux, osteoarthritis of hips and knees, headaches, bilateral carpal tunnel syndrome. Patient had cholecystectomy 1987. Left hammertoe surgery January 1997. Right trigger thumb release 1998. Mucous cyst removed from left thumb in 1980s. Bilateral tubal ligation in the 1980s. Left bunionectomy March 2004. Patient's ophthalmologist, Dr. Herbert Deaner recently referred her to [facility] regarding possible uveitis related to sarcoidosis. Dr. Leo Grosser is GYN physician. Patient is seen with our pulmonary in the past regarding sarcoidosis and has been on Flovent inhaler and albuterol inhaler. History of benign heart murmur. Recently was in emergency department at [facility] with abdominal pain. She thought she had a kidney stone. History of kidney stone may 2010. Patient was treated with hydrocodone 5/325. CT of the abdomen and pelvis was unremarkable. Patient complaining of left lumbar radiculopathy. Pain in abdomen seems to be radiating around to her left buttock area.    Review of Systems     Objective:   Physical Exam chest is clear to auscultation; cardiac exam regular rate and rhythm 1/6 systolic ejection murmur; abdomen obese soft nondistended no hepatosplenomegaly masses but some mild left abdominal tenderness without rebound. Straight leg raising on the left is positive, negative on the right. Deep tendon reflexes absent in the knees. Muscle strength in left and right lower extremities are within normal limits.        Assessment & Plan:  I believe her abdominal pain is more likely to be musculoskeletal pain in left abdomen.  Left lumbar radiculopathy. Possible spinal stenosis.  History of pulmonary sarcoidosis  History of uveitis-bilateral  Morbid  obesity  Hypertension  GE reflux  Osteoarthritis of hips and knees  Headaches  Bilateral carpal tunnel syndrome  Hypertension  Plan: Sterapred DS 10 mg dose pack. Take for 6 days. Vicodin 5/500 (#40) 1 by mouth every 6 hours when necessary pain. Flexeril 10 mg (#30) one by mouth each bedtime. Call if not better in 5-7 days or sooner if worse. Consider lumbar MRI.

## 2011-11-13 ENCOUNTER — Other Ambulatory Visit: Payer: Self-pay | Admitting: Internal Medicine

## 2011-11-13 ENCOUNTER — Ambulatory Visit
Admission: RE | Admit: 2011-11-13 | Discharge: 2011-11-13 | Disposition: A | Discharge status: Home / Self Care | Payer: BC Managed Care – PPO | Source: Ambulatory Visit | Attending: Internal Medicine | Admitting: Internal Medicine

## 2011-11-13 DIAGNOSIS — M5410 Radiculopathy, site unspecified: Secondary | ICD-10-CM

## 2012-07-18 ENCOUNTER — Other Ambulatory Visit: Payer: Self-pay | Admitting: Internal Medicine

## 2012-08-04 ENCOUNTER — Other Ambulatory Visit: Payer: Self-pay | Admitting: Internal Medicine

## 2012-08-04 DIAGNOSIS — Z1231 Encounter for screening mammogram for malignant neoplasm of breast: Secondary | ICD-10-CM

## 2012-08-10 ENCOUNTER — Ambulatory Visit
Admission: RE | Admit: 2012-08-10 | Discharge: 2012-08-10 | Disposition: A | Discharge status: Home / Self Care | Payer: BC Managed Care – PPO | Source: Ambulatory Visit | Attending: Internal Medicine | Admitting: Internal Medicine

## 2012-08-10 DIAGNOSIS — Z1231 Encounter for screening mammogram for malignant neoplasm of breast: Secondary | ICD-10-CM

## 2012-11-02 ENCOUNTER — Encounter: Payer: Self-pay | Admitting: Obstetrics and Gynecology

## 2012-11-02 ENCOUNTER — Ambulatory Visit (INDEPENDENT_AMBULATORY_CARE_PROVIDER_SITE_OTHER): Payer: BC Managed Care – PPO | Admitting: Obstetrics and Gynecology

## 2012-11-02 VITALS — BP 130/76 | Ht 66.0 in | Wt 267.0 lb

## 2012-11-02 DIAGNOSIS — Z01419 Encounter for gynecological examination (general) (routine) without abnormal findings: Secondary | ICD-10-CM

## 2012-11-02 DIAGNOSIS — Z0000 Encounter for general adult medical examination without abnormal findings: Secondary | ICD-10-CM

## 2012-11-02 NOTE — Patient Instructions (Signed)
Exercise to Lose Weight Exercise and a healthy diet may help you lose weight. Your doctor may suggest specific exercises. EXERCISE IDEAS AND TIPS  Choose low-cost things you enjoy doing, such as walking, bicycling, or exercising to workout videos.  Take stairs instead of the elevator.  Walk during your lunch break.  Park your car further away from work or school.  Go to a gym or an exercise class.  Start with 5 to 10 minutes of exercise each day. Build up to 30 minutes of exercise 4 to 6 days a week.  Wear shoes with good support and comfortable clothes.  Stretch before and after working out.  Work out until you breathe harder and your heart beats faster.  Drink extra water when you exercise.  Do not do so much that you hurt yourself, feel dizzy, or get very short of breath. Exercises that burn about 150 calories:  Running 1  miles in 15 minutes.  Playing volleyball for 45 to 60 minutes.  Washing and waxing a car for 45 to 60 minutes.  Playing touch football for 45 minutes.  Walking 1  miles in 35 minutes.  Pushing a stroller 1  miles in 30 minutes.  Playing basketball for 30 minutes.  Raking leaves for 30 minutes.  Bicycling 5 miles in 30 minutes.  Walking 2 miles in 30 minutes.  Dancing for 30 minutes.  Shoveling snow for 15 minutes.  Swimming laps for 20 minutes.  Walking up stairs for 15 minutes.  Bicycling 4 miles in 15 minutes.  Gardening for 30 to 45 minutes.  Jumping rope for 15 minutes.  Washing windows or floors for 45 to 60 minutes. Document Released: 12/04/2010 Document Revised: 01/24/2012 Document Reviewed: 12/04/2010 [facility] Patient Information 2013 [place].

## 2012-11-02 NOTE — Progress Notes (Signed)
Last Pap: 02/18/2011 WNL: Yes Regular Periods:no  Contraception: Hyst   Monthly Breast exam:no Tetanus<60yr:yes Nl.Bladder Function:yes Daily BMs:yes Healthy Diet:no Calcium:yes Mammogram:yes Date of Mammogram: 07/2012 Exercise:no Seatbelt: yes Abuse at home: no Stressful work:yes Sigmoid-colonoscopy: 10 years ago  Bone Density: No PCP: Dr.Baxley  Change in PMH: unchanged  Change in FRWC:[id number] BP 130/76  Ht _0  (1.676 m)  Wt 267 lb (121.11 kg)  BMI 43.09 kg/m2 Physical Examination: Neck - supple, no significant adenopathy Chest - clear to auscultation, no wheezes, rales or rhonchi, symmetric air entry Heart - normal rate, regular rhythm, normal S1, S2, no murmurs, rubs, clicks or gallops Abdomen - soft, nontender, nondistended, no masses or organomegaly Breasts - breasts appear normal, no suspicious masses, no skin or nipple changes or axillary nodes Pelvic - normal external genitalia, vulva, vagina,  and adnexa, atrophic. Cervix and uterus is absent Rectal - normal rectal, no masses Musculoskeletal - no joint tenderness, deformity or swelling Extremities - peripheral pulses normal, no pedal edema, no clubbing or cyanosis Skin - normal coloration and turgor, no rashes, no suspicious skin lesions noted Normal AEX Pt due for mammogram no colonoscopy due yes pt desirs to wait for now Pap done no RT one year Diet and exercise discussed

## 2012-11-13 ENCOUNTER — Ambulatory Visit: Payer: BC Managed Care – PPO | Admitting: Internal Medicine

## 2012-11-20 ENCOUNTER — Ambulatory Visit: Payer: BC Managed Care – PPO | Admitting: Internal Medicine

## 2012-11-23 ENCOUNTER — Ambulatory Visit (INDEPENDENT_AMBULATORY_CARE_PROVIDER_SITE_OTHER): Payer: BC Managed Care – PPO | Admitting: Internal Medicine

## 2012-11-23 ENCOUNTER — Encounter: Payer: Self-pay | Admitting: Internal Medicine

## 2012-11-23 VITALS — BP 136/90 | HR 80 | Temp 98.0°F | Wt 262.0 lb

## 2012-11-23 DIAGNOSIS — G8929 Other chronic pain: Secondary | ICD-10-CM

## 2012-11-23 DIAGNOSIS — M25559 Pain in unspecified hip: Secondary | ICD-10-CM

## 2012-11-23 DIAGNOSIS — M25569 Pain in unspecified knee: Secondary | ICD-10-CM

## 2012-11-23 DIAGNOSIS — D869 Sarcoidosis, unspecified: Secondary | ICD-10-CM

## 2012-11-23 LAB — ANGIOTENSIN CONVERTING ENZYME: Angiotensin-Converting Enzyme: 7 U/L — ABNORMAL LOW (ref 8–52)

## 2013-01-28 NOTE — Progress Notes (Signed)
  Subjective:    Patient ID: Kristine [name], female    DOB: 06-13-1951, 62 y.o.   MRN: [id number]  HPI 62 year old 104 female with history of morbid obesity, asthma, sarcoidosis, bilateral posterior uveitis, GE reflux, hypertension osteoarthritis in today with several complaints. Patient having issues with shortness of breath. Pain in left hip. Needs refill on antihypertensive medication. Patient being treated by ophthalmologist for eye issues which are thought to be related to sarcoidosis. Patient is seen here infrequently. Tends to come in when she has lots of complaints. Doesn't seem to diet or exercise.  Penicillin causes a rash. History of bilateral trigger thumbs 1997. History of bilateral carpal tunnel syndrome. Left kidney stone may 2010.  Cholecystectomy 1987, left hammertoe surgery 1997, right trigger thumb release 1998, mucous cyst removed from left thumb in the 1980s, bilateral tubal ligation in the 1980s, left bunionectomy March 2004. Dr. Leo Grosser is GYN physician. Patient had colonoscopy by Dr. Oletta Lamas 12/27/2003 which was normal and tender followup was recommended. Dr. Herbert Deaner is her ophthalmologist. She's also been referred to [facility] to Dr. Silvestre Gunner for ophthalmology evaluation. She has history of elevated intraocular pressures. Problems seem to start in September 2011 when she was diagnosed with intraocular pressure 63 with right eye uveitis- iritis.  Patient had chest x-ray showing no active disease 02/03/2011. Stable bilateral mediastinal adenopathy consistent with sarcoidosis.  Family history: Father died at age 54 from lung cancer. Mother died at age 52 from lung cancer. Maternal grandmother died at age 9 from natural causes. Brother with history of liver cancer.  Social history: Married with 2 adult children a son and a daughter. 2 grandchildren. 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 Nonsmoker. No  alcohol consumption.  Additional history: Patient had Cardiolite study which was negative August 1999. Mammogram March 2012. Patient had MRI of the brain without contrast in 2005 for headaches. This study was negative. In 2003 patient had MRI of the right hip with no pathology seen. She had MRI of the LS spine because of complaint of back pain. She had degenerative disc disease at L4-L5 with posterior annular tearing and shallow protrusion of disc material. Mild facet degenerative disease. Mild degenerative disc disease L5-S1. No significant herniations or stenosis.  Pulmonary sarcoidosis diagnosed in 1998. At that time she also had CT of abdomen and pelvis which was unremarkable except for fibroid uterus.  History of migraine without aura seen by Dr. Vic Blackbird in 1992. They seem to be associated with menses.  Because of family history of cancer in parents she worries a great deal about possibility of malignancy.   Review of Systems     Objective:   Physical Exam skin warm and dry; neck is supple without JVD thyromegaly or carotid bruits; chest clear to auscultation without rales or wheezes; cardiac exam regular rate and rhythm normal S1 and S2; extremities without pitting edema. Some pain in left hip with internal and external rotation.        Assessment & Plan:  History of sarcoidosis-pulmonary aspect is stable but has been diagnosed with uveitis presumably secondary to sarcoidosis  History of asthma  GE reflux  Osteoarthritis knees and hips suspect left hip pain is osteoarthritis and have suggested an x-ray  Hypertension  Morbid obesity  Plan: Patient to return in 6 months for physical examination and followup.

## 2013-01-28 NOTE — Patient Instructions (Addendum)
Have x-ray done of left hip. Continue to see Dr. Herbert Deaner. Continue same antihypertensive medication. Return in 6 months for physical examination. Try the diet and exercise.

## 2013-03-27 ENCOUNTER — Encounter: Payer: Self-pay | Admitting: Internal Medicine

## 2013-03-27 ENCOUNTER — Ambulatory Visit (INDEPENDENT_AMBULATORY_CARE_PROVIDER_SITE_OTHER): Payer: BC Managed Care – PPO | Admitting: Internal Medicine

## 2013-03-27 VITALS — BP 146/92 | HR 76 | Temp 98.4°F | Wt 259.0 lb

## 2013-03-27 DIAGNOSIS — R7309 Other abnormal glucose: Secondary | ICD-10-CM

## 2013-03-27 DIAGNOSIS — R7302 Impaired glucose tolerance (oral): Secondary | ICD-10-CM

## 2013-04-03 ENCOUNTER — Other Ambulatory Visit: Payer: BC Managed Care – PPO | Admitting: Internal Medicine

## 2013-04-05 ENCOUNTER — Other Ambulatory Visit: Payer: BC Managed Care – PPO | Admitting: Internal Medicine

## 2013-04-05 DIAGNOSIS — Z131 Encounter for screening for diabetes mellitus: Secondary | ICD-10-CM

## 2013-04-05 DIAGNOSIS — Z0000 Encounter for general adult medical examination without abnormal findings: Secondary | ICD-10-CM

## 2013-04-05 DIAGNOSIS — I10 Essential (primary) hypertension: Secondary | ICD-10-CM

## 2013-04-05 LAB — LIPID PANEL
Cholesterol: 177 mg/dL (ref 0–200)
HDL: 43 mg/dL (ref >39)
Triglycerides: 74 mg/dL (ref <150)

## 2013-04-05 LAB — COMPREHENSIVE METABOLIC PANEL
Albumin: 4 g/dL (ref 3.5–5.2)
BUN: 17 mg/dL (ref 6–23)
Calcium: 9.3 mg/dL (ref 8.4–10.5)
Chloride: 108 mEq/L (ref 96–112)
Glucose, Bld: 95 mg/dL (ref 70–99)
Potassium: 4.5 mEq/L (ref 3.5–5.3)

## 2013-04-05 LAB — CBC WITH DIFFERENTIAL/PLATELET
Basophils Relative: 1 % (ref 0–1)
HCT: 36.7 % (ref 36.0–46.0)
Hemoglobin: 12.1 g/dL (ref 12.0–15.0)
Lymphocytes Relative: 28 % (ref 12–46)
MCHC: 33 g/dL (ref 30.0–36.0)
MCV: 78.9 fL (ref 78.0–100.0)
Monocytes Absolute: 0.5 10*3/uL (ref 0.1–1.0)
Monocytes Relative: 7 % (ref 3–12)
Neutro Abs: 3.9 10*3/uL (ref 1.7–7.7)

## 2013-04-06 LAB — HEMOGLOBIN A1C: Mean Plasma Glucose: 120 mg/dL — ABNORMAL HIGH (ref <117)

## 2013-04-10 NOTE — Progress Notes (Signed)
Appointment scheduled.

## 2013-04-10 NOTE — Progress Notes (Signed)
Left message for patient to call back. Tentatively scheduled for 04/16/2013 at 3:45pm

## 2013-04-14 DIAGNOSIS — R7302 Impaired glucose tolerance (oral): Secondary | ICD-10-CM | POA: Insufficient documentation

## 2013-04-14 NOTE — Progress Notes (Signed)
  Subjective:    Patient ID: Kristine [name], female    DOB: 06-29-1951, 62 y.o.   MRN: [id number]  HPI 62 year old black female with history of obesity, hypertension, GE reflux, glaucoma, osteoarthritis, sarcoidosis in today saying she just doesn't feel well. She is very nonspecific about her complaints. Her brother-in-law was recently diagnosed with pancreatic cancer. I believe this has her concerned.  She had colonoscopy in June 2012. History of border with normal thyroid functions. History of asthma.  Penicillin causes a rash  History of cholecystectomy 1987  Left hammertoe surgery by podiatrist 1997  Right trigger thumb release 1998  Mucous cyst removed from left thumb 1980s  Bilateral tubal ligation 1980s  Left bunionectomy March 2004  Dr. Leo Grosser is GYN physician.  History of bilateral trigger thumbs 1997  History of bilateral carpal tunnel syndrome  Left kidney stone may 2010  Negative Cardiolite study 1999.  Social history: 2 adult children a daughter and a son. Married. Does not smoke or consume alcohol.  Currently employed at [employer]. Formerly worked as a Network engineer at [employer].  Family history: Father died at age 42 with history of lung cancer and smoking. Mother died at 89 with history of metastatic breast cancer. One brother died at 46 with lung cancer.  History of chronic back pain with lumbar disc disease and spinal stenosis. MRI of LS-spine and 2012 showed disc at L4-L5.       Review of Systems     Objective:   Physical Exam  Vitals reviewed. Constitutional: She is oriented to person, place, and time. She appears well-developed and well-nourished. No distress.  HENT:  Head: Normocephalic and atraumatic.  Right Ear: External ear normal.  Left Ear: External ear normal.  Mouth/Throat: Oropharynx is clear and moist.  Eyes: Right eye exhibits no discharge. Left eye exhibits no discharge. No scleral icterus.  Neck: No JVD  present.  Cardiovascular: Normal rate, regular rhythm and normal heart sounds.   No murmur heard. Pulmonary/Chest: Effort normal and breath sounds normal. She has no wheezes. She has no rales. She exhibits no tenderness.  Abdominal: Soft. Bowel sounds are normal. She exhibits no distension. There is no rebound and no guarding.  Musculoskeletal: She exhibits no edema.  Left paralumbar back pain. Straight leg raising is negative at 90.  Lymphadenopathy:    She has no cervical adenopathy.  Neurological: She is alert and oriented to person, place, and time. No cranial nerve deficit. Coordination normal.  Skin: Skin is dry. She is not diaphoretic.          Assessment & Plan:  Hypertension-has not had antihypertensive medication today  Obesity  History of GE reflux  History of goiter  Osteoarthritis  Lumbar disc disease and spinal stenosis  Sarcoidosis-pulmonary sarcoid is stable. Sees ophthalmologist for uveitis  Glaucoma  History of bilateral carpal tunnel syndrome  Malaise and fatigue-unexplained. Patient needs to diet exercise and lose weight. Does not seem to be motivated. One consideration for malaise and fatigue could be sleep apnea. Consider sleep evaluation.  Patient will return next week for fasting lab work including TSH and hemoglobin A1c.  Addendum: Patient has impaired glucose tolerance with hemoglobin A1c being 5.8%. Recommend diet exercise and weight loss. Recheck in 6 months. LDL cholesterol is 119.

## 2013-04-14 NOTE — Patient Instructions (Addendum)
Return next week for fasting lab work . Continue antihypertensive medication. Diet exercise and lose weight

## 2013-04-16 ENCOUNTER — Ambulatory Visit (INDEPENDENT_AMBULATORY_CARE_PROVIDER_SITE_OTHER): Payer: BC Managed Care – PPO | Admitting: Internal Medicine

## 2013-04-16 ENCOUNTER — Encounter: Payer: Self-pay | Admitting: Internal Medicine

## 2013-04-16 VITALS — BP 136/88 | HR 84 | Wt 259.0 lb

## 2013-04-16 DIAGNOSIS — E7800 Pure hypercholesterolemia, unspecified: Secondary | ICD-10-CM

## 2013-04-16 DIAGNOSIS — M48061 Spinal stenosis, lumbar region without neurogenic claudication: Secondary | ICD-10-CM

## 2013-04-16 DIAGNOSIS — E559 Vitamin D deficiency, unspecified: Secondary | ICD-10-CM

## 2013-04-16 DIAGNOSIS — E8881 Metabolic syndrome: Secondary | ICD-10-CM

## 2013-05-13 DIAGNOSIS — M48061 Spinal stenosis, lumbar region without neurogenic claudication: Secondary | ICD-10-CM | POA: Insufficient documentation

## 2013-05-13 DIAGNOSIS — E559 Vitamin D deficiency, unspecified: Secondary | ICD-10-CM | POA: Insufficient documentation

## 2013-05-13 DIAGNOSIS — E7800 Pure hypercholesterolemia, unspecified: Secondary | ICD-10-CM | POA: Insufficient documentation

## 2013-05-13 DIAGNOSIS — E8881 Metabolic syndrome: Secondary | ICD-10-CM | POA: Insufficient documentation

## 2013-05-13 NOTE — Progress Notes (Signed)
  Subjective:    Patient ID: Kristine [name], female    DOB: February 01, 1951, 62 y.o.   MRN: [id number]  HPI Patient was seen recently complaining of back pain and hip pain. History of lumbar spinal stenosis and lumbar disc disease. This She has a history of hypertension and sarcoidosis. She is morbidly obese. History of hypertension. History of GE reflux and goiter. History of asthma. History of bilateral posterior uveitis thought to be related to sarcoidosis. At last visit , she agreed  to have fasting lab work which was done May 22. She was found to have an LDL cholesterol of 119, hemoglobin A1c 5.8% and vitamin D level of 14. History of left kidney stone May 2010. Patient had chest x-ray showing no active disease March 2012 with stable bilateral mediastinal adenopathy consistent with sarcoidosis. Both parents died with lung cancer. She is fearful of contracting cancer. See note 03/27/2013.    Review of Systems     Objective:   Physical Exam she was not examined today. She's here today to discuss these results. Order written for home glucose monitor diabetic test strips and lancets. She likes to cook. She's going to need to follow a strict diabetic diet and lose some weight. I believe she would feel better she would do this. Also prescription for Drisdol 50,000 units to take weekly for 3 months and then she should take 2000 units vitamin D daily. Needs to follow a low-fat diet.        Assessment & Plan:  Impaired glucose tolerance-hemoglobin A1c 5.8%. Does not want to be on medication. Was given diet a try.  Hyperlipidemia-LDL cholesterol 119  Morbid obesity  Lumbar spinal stenosis and lumbar disc disease  History of sarcoidosis  Hypertension-stable on medication  Metabolic syndrome  Vitamin D deficiency-see prescription above  Plan: Patient to follow strict diabetic diet, exercise, lose weight. Return for reevaluation 4-6 months.  Time spent with patient 25 minutes

## 2013-05-13 NOTE — Patient Instructions (Addendum)
You need to follow an 1800-calorie ADA diet. Need to watch fat in diet. Need to diet exercise and lose weight. Check Accu-Cheks once or twice daily before meals. Return in 4-6 months for reevaluation in hemoglobin A 1C. Take Drisdol 50,000 units weekly for 3 months then 2000 units daily for vitamin D deficiency.

## 2013-08-08 ENCOUNTER — Other Ambulatory Visit: Payer: Self-pay

## 2013-08-08 DIAGNOSIS — Z1231 Encounter for screening mammogram for malignant neoplasm of breast: Secondary | ICD-10-CM

## 2013-09-03 ENCOUNTER — Encounter: Payer: Self-pay | Admitting: Internal Medicine

## 2013-09-03 ENCOUNTER — Ambulatory Visit (INDEPENDENT_AMBULATORY_CARE_PROVIDER_SITE_OTHER): Payer: BC Managed Care – PPO | Admitting: Internal Medicine

## 2013-09-03 VITALS — BP 162/92 | HR 72 | Temp 97.9°F | Ht 66.0 in | Wt 254.0 lb

## 2013-09-03 DIAGNOSIS — E119 Type 2 diabetes mellitus without complications: Secondary | ICD-10-CM

## 2013-09-03 LAB — HEMOGLOBIN A1C: Hgb A1c MFr Bld: 6.1 % — ABNORMAL HIGH (ref <5.7)

## 2013-09-03 MED ORDER — FLUOXETINE HCL 20 MG PO TABS: 20.0000 mg | ORAL_TABLET | Freq: Every day | ORAL | Status: DC

## 2013-09-03 NOTE — Patient Instructions (Signed)
Prozac 20 mg daily and return in 4 weeks.

## 2013-09-03 NOTE — Progress Notes (Signed)
  Subjective:    Patient ID: Kristine [name], female    DOB: 03/09/1951, 62 y.o.   MRN: [id number]  HPI  62 year old 13 female with history of HTN, DM, chronic low back pain, obesity for recheck. Is grieving loss of  Kristine [name], close relative who died by suicide recently. She's tearful in the office today. Worried about her family. Worried about a net [place] was in her memory loss. Multiple concerns shared today. Spent 30 minutes speaking with patient about all of these issues. She's not sleeping well. Her back hurts. Her blood pressure is elevated. Denies noncompliance with blood pressure medication. Thinks she was depressed even before death of close relative. Worries a great deal about her family. Appetite is decreased.             Review of Systems     Objective:   Physical Exam  Chest clear to auscultation. Cardiac exam regular rate and rhythm. Extremities without edema. Alert and oriented. Tearful in the office today. Skin is warm and dry.        Assessment & Plan:  Depression  Obesity  Diabetes mellitus-hemoglobin A1c drawn today  Hypertension-blood pressure elevated today I think because she is upset.  Low back pain which is long-standing.  Plan: Start Prozac 20 mg daily and return in 4 weeks for reevaluation. May need to see a counselor. Hopefully Prozac will help with her back pain is well.

## 2013-09-04 ENCOUNTER — Telehealth: Payer: Self-pay | Admitting: *Deleted

## 2013-09-04 NOTE — Telephone Encounter (Signed)
Pt phoned in questioning if it was okay to take newly prescribed medication Prozac, after reading the pharmacy insert advising against glaucoma patients taking.  Discussed with Dr. Renold Genta, and she advised pt to consult her eye doctor.  Pt verbalized understanding.

## 2013-09-05 ENCOUNTER — Ambulatory Visit
Admission: RE | Admit: 2013-09-05 | Discharge: 2013-09-05 | Disposition: A | Discharge status: Home / Self Care | Payer: BC Managed Care – PPO | Source: Ambulatory Visit

## 2013-09-05 DIAGNOSIS — Z1231 Encounter for screening mammogram for malignant neoplasm of breast: Secondary | ICD-10-CM

## 2013-09-24 ENCOUNTER — Ambulatory Visit: Payer: BC Managed Care – PPO | Admitting: Internal Medicine

## 2013-09-25 ENCOUNTER — Ambulatory Visit (INDEPENDENT_AMBULATORY_CARE_PROVIDER_SITE_OTHER): Payer: BC Managed Care – PPO | Admitting: Internal Medicine

## 2013-09-25 ENCOUNTER — Encounter: Payer: Self-pay | Admitting: Internal Medicine

## 2013-09-25 VITALS — BP 148/92 | HR 72 | Temp 98.0°F | Ht 66.0 in | Wt 254.0 lb

## 2013-09-25 DIAGNOSIS — F32A Depression, unspecified: Secondary | ICD-10-CM

## 2013-09-25 DIAGNOSIS — F329 Major depressive disorder, single episode, unspecified: Secondary | ICD-10-CM

## 2013-09-25 DIAGNOSIS — I10 Essential (primary) hypertension: Secondary | ICD-10-CM

## 2013-09-25 MED ORDER — FUROSEMIDE 20 MG PO TABS: 20.0000 mg | ORAL_TABLET | Freq: Every day | ORAL | Status: DC

## 2013-09-25 NOTE — Patient Instructions (Addendum)
Start Lasix 20 mg daily and return in 4 weeks. Continue Prozac.

## 2013-09-25 NOTE — Progress Notes (Signed)
  Subjective:    Patient ID: Kristine [name], female    DOB: 09/09/1951, 62 y.o.   MRN: [id number]  HPI For follow up on depression.  Is on Prozac 20 mg daily. She had called here concerned about taking it with history of glaucoma. We ask her to check with eye physician. She has been taking it and is sleeping much better. Seems a bit calmer and less stressed.blood pressure is elevated today. Is on lisinopril 20 mg daily and has taken it for many years. She has a time some dependent edema. Seems worried about that but assured her it was not unusual for her age and with her sedentary job. Reminded her about glucose intolerance and the need for diet and exercise. Hemoglobin A1c is not as good as it was previously. She did have flu vaccine through employment.    Review of Systems     Objective:   Physical Examspent 20 minutes speaking with patient about these issues        Assessment & Plan:  Hypertension-add diuretic Lasix 20 mg daily and reassess in 4 weeks with office visit blood pressure check and basic metabolic panel  Obesity-recommend diet and exercise  Type 2 diabetes mellitus-recommend strict diet and exercise and reevaluate in 3-4 months  Depression-continue Prozac 20 mg daily  Plan: Return in 4 weeks for office visit blood pressure check and basic metabolic panel on Lasix 20 mg daily, Prinivil 20 mg daily.

## 2013-10-30 ENCOUNTER — Ambulatory Visit: Payer: BC Managed Care – PPO | Admitting: Internal Medicine

## 2013-11-01 ENCOUNTER — Ambulatory Visit (INDEPENDENT_AMBULATORY_CARE_PROVIDER_SITE_OTHER): Payer: BC Managed Care – PPO | Admitting: Internal Medicine

## 2013-11-01 ENCOUNTER — Encounter: Payer: Self-pay | Admitting: Internal Medicine

## 2013-11-01 VITALS — BP 144/90 | HR 72 | Temp 98.6°F | Ht 66.0 in | Wt 250.5 lb

## 2013-11-01 DIAGNOSIS — E7800 Pure hypercholesterolemia, unspecified: Secondary | ICD-10-CM

## 2013-11-01 DIAGNOSIS — I10 Essential (primary) hypertension: Secondary | ICD-10-CM

## 2013-11-01 LAB — BASIC METABOLIC PANEL
BUN: 17 mg/dL (ref 6–23)
Potassium: 3.7 mEq/L (ref 3.5–5.3)
Sodium: 143 mEq/L (ref 135–145)

## 2013-11-01 MED ORDER — LISINOPRIL 20 MG PO TABS: ORAL_TABLET | ORAL | Status: DC

## 2013-11-01 MED ORDER — FUROSEMIDE 20 MG PO TABS: 40.0000 mg | ORAL_TABLET | Freq: Every day | ORAL | Status: DC

## 2013-11-01 NOTE — Progress Notes (Signed)
   Subjective:    Patient ID: Kristine [name], female    DOB: 02-12-1951, 62 y.o.   MRN: [id number]  HPI   Here for followup on hypertension and depression. Blood pressure is 144/90. She has been started on Prozac for depression and that seems to be helping. She is on the sun up real and Lasix 20 mg daily.    Review of Systems     Objective:   Physical Exam Neck is supple without JVD thyromegaly or carotid bruits. Chest clear to auscultation. Cardiac exam regular rate and rhythm. Extremities without edema.        Assessment & Plan:  Depression  Essential hypertension  Obesity  Metabolic syndrome  Impaired glucose tolerance  Plan: Continue Prozac as previously prescribed. Increase Lasix from 20-40 mg daily and return in 4 weeks. Basic metabolic panel will be drawn at that time. Needs to really take diet and exercise seriously. We have had considerable discussion about this in the past and once again today.

## 2013-11-26 ENCOUNTER — Telehealth: Payer: Self-pay | Admitting: Internal Medicine

## 2013-11-26 NOTE — Telephone Encounter (Signed)
Patient called office Friday afternoon January 10 and was told I was out due to illness. Was advised to go to urgent care. Patient says she went to [facility] [address]  and was charged [phone] for a visit. She was given prednisone and a cough preparation but not Hycodan. Says she's been up all night coughing and wheezing. No fever or chills. She does have an inhaler. She needs to use that 4 times a day. Call in [place] 500 milligrams daily for 7 days and a Medrol 4 mg 6 day dosepak to [facility]. Advise pt that Hycodan cannot be called in due to new regulations. Prescription must be present up in the office. She has some leftover Hycodan in the refrigerator which she will try. Advise patient that in the future, she should go to urgent care on [address] which will take her primary care  Co-pay.

## 2013-11-26 NOTE — Telephone Encounter (Signed)
Gen. dosepak and Levaquin called in from patient yesterday. Patient advised to use inhaler 4 times daily. Followup call today per nurse. Patient says she is feeling better. Asked if she needed hydrocodone cough syrup printed up here in the office today and patient says now that she got Hycodan for her daughter.

## 2013-12-03 ENCOUNTER — Ambulatory Visit: Payer: BC Managed Care – PPO | Admitting: Internal Medicine

## 2013-12-10 ENCOUNTER — Ambulatory Visit (INDEPENDENT_AMBULATORY_CARE_PROVIDER_SITE_OTHER): Payer: BC Managed Care – PPO | Admitting: Internal Medicine

## 2013-12-10 ENCOUNTER — Encounter: Payer: Self-pay | Admitting: Internal Medicine

## 2013-12-10 ENCOUNTER — Ambulatory Visit
Admission: RE | Admit: 2013-12-10 | Discharge: 2013-12-10 | Disposition: A | Discharge status: Home / Self Care | Payer: BC Managed Care – PPO | Source: Ambulatory Visit | Attending: Internal Medicine | Admitting: Internal Medicine

## 2013-12-10 VITALS — BP 120/68 | HR 80 | Temp 98.7°F | Wt 247.0 lb

## 2013-12-10 DIAGNOSIS — E119 Type 2 diabetes mellitus without complications: Secondary | ICD-10-CM

## 2013-12-10 DIAGNOSIS — R0602 Shortness of breath: Secondary | ICD-10-CM

## 2013-12-10 DIAGNOSIS — Z862 Personal history of diseases of the blood and blood-forming organs and certain disorders involving the immune mechanism: Secondary | ICD-10-CM

## 2013-12-10 DIAGNOSIS — I10 Essential (primary) hypertension: Secondary | ICD-10-CM

## 2013-12-10 DIAGNOSIS — Z8639 Personal history of other endocrine, nutritional and metabolic disease: Secondary | ICD-10-CM

## 2013-12-10 DIAGNOSIS — F4321 Adjustment disorder with depressed mood: Secondary | ICD-10-CM

## 2013-12-10 MED ORDER — CYCLOBENZAPRINE HCL 10 MG PO TABS: 10.0000 mg | ORAL_TABLET | Freq: Every day | ORAL | Status: DC

## 2013-12-10 NOTE — Patient Instructions (Addendum)
Take Flexeril at night for musculoskeletal pain. Have pulmonary function testing and cardiology evaluation. Have chest x-ray.

## 2013-12-11 LAB — BASIC METABOLIC PANEL
BUN: 18 mg/dL (ref 6–23)
CALCIUM: 9.1 mg/dL (ref 8.4–10.5)
CHLORIDE: 105 meq/L (ref 96–112)
CO2: 29 mEq/L (ref 19–32)
CREATININE: 1.04 mg/dL (ref 0.50–1.10)
Glucose, Bld: 104 mg/dL — ABNORMAL HIGH (ref 70–99)
Potassium: 4 mEq/L (ref 3.5–5.3)
Sodium: 142 mEq/L (ref 135–145)

## 2013-12-11 NOTE — Progress Notes (Signed)
Patient informed

## 2013-12-24 ENCOUNTER — Telehealth: Payer: Self-pay

## 2013-12-24 NOTE — Telephone Encounter (Signed)
Would like you to call her. Wants to discuss something you talked about at last visit.

## 2013-12-24 NOTE — Telephone Encounter (Signed)
Returned call- no answer.

## 2013-12-27 ENCOUNTER — Institutional Professional Consult (permissible substitution): Payer: BC Managed Care – PPO | Admitting: Cardiovascular Disease

## 2014-01-11 ENCOUNTER — Ambulatory Visit (INDEPENDENT_AMBULATORY_CARE_PROVIDER_SITE_OTHER): Payer: BC Managed Care – PPO | Admitting: Internal Medicine

## 2014-01-11 ENCOUNTER — Other Ambulatory Visit: Payer: Self-pay | Admitting: Internal Medicine

## 2014-01-11 DIAGNOSIS — R0602 Shortness of breath: Secondary | ICD-10-CM

## 2014-01-11 LAB — PULMONARY FUNCTION TEST
DL/VA % pred: 81 %
DL/VA: 4.21 ml/min/mmHg/L
DLCO unc % pred: 77 %
DLCO unc: 21.83 ml/min/mmHg
FEF 25-75 POST: 2.74 L/s
FEF 25-75 Pre: 2.57 L/sec
FEF2575-%CHANGE-POST: 6 %
FEF2575-%PRED-POST: 122 %
FEF2575-%Pred-Pre: 114 %
FEV1-%Change-Post: 1 %
FEV1-%Pred-Post: 114 %
FEV1-%Pred-Pre: 113 %
FEV1-Post: 2.67 L
FEV1-Pre: 2.64 L
FEV1FVC-%CHANGE-POST: 1 %
FEV1FVC-%Pred-Pre: 101 %
FEV6-%Change-Post: 0 %
FEV6-%Pred-Post: 113 %
FEV6-%Pred-Pre: 114 %
FEV6-PRE: 3.29 L
FEV6-Post: 3.28 L
FEV6FVC-%Pred-Post: 103 %
FEV6FVC-%Pred-Pre: 103 %
FVC-%Change-Post: 0 %
FVC-%Pred-Post: 110 %
FVC-%Pred-Pre: 110 %
FVC-Post: 3.28 L
FVC-Pre: 3.29 L
POST FEV6/FVC RATIO: 100 %
PRE FEV1/FVC RATIO: 80 %
Post FEV1/FVC ratio: 82 %
Pre FEV6/FVC Ratio: 100 %
RV % PRED: 89 %
RV: 1.93 L
TLC % pred: 99 %
TLC: 5.46 L

## 2014-01-11 NOTE — Progress Notes (Signed)
PFT done today. 

## 2014-03-04 ENCOUNTER — Other Ambulatory Visit: Payer: Self-pay | Admitting: Internal Medicine

## 2014-04-14 NOTE — Patient Instructions (Signed)
Increase Lasix from 20-40 mg daily. Continue lisinopril. Return in 4 weeks.

## 2014-05-27 ENCOUNTER — Ambulatory Visit (INDEPENDENT_AMBULATORY_CARE_PROVIDER_SITE_OTHER): Payer: BC Managed Care – PPO | Admitting: Internal Medicine

## 2014-05-27 VITALS — BP 140/84

## 2014-05-27 DIAGNOSIS — I10 Essential (primary) hypertension: Secondary | ICD-10-CM

## 2014-05-27 NOTE — Progress Notes (Signed)
Patient is here today reporting she has had a headache at back of head and neck area this past weekend. States her daughter is going through a miscarriage, and that is weighing on her mind. Has not taken anything for this headache. Advised Ibuprofen or Aleve prn. She will call back to schedule appointment if no better.

## 2014-05-27 NOTE — Progress Notes (Signed)
   Subjective:    Patient ID: Kristine [name], female    DOB: 1951/10/21, 63 y.o.   MRN: [id number]  HPI    Review of Systems     Objective:   Physical Exam        Assessment & Plan:

## 2014-06-01 ENCOUNTER — Encounter: Payer: Self-pay | Admitting: Internal Medicine

## 2014-06-01 NOTE — Progress Notes (Signed)
   Subjective:    Patient ID: Kristine [name], female    DOB: 1951-03-20, 63 y.o.   MRN: [id number]  HPI Patient was seen these rate for hypertension and dependent edema. She also is on Prozac for history of depression. She is on lisinopril 20 mg daily. At last visit Lasix was increased from 20-40 mg daily. Feeling a bit better. However having significant grief reaction over loss of [name] due to suicide. Complaining of some shortness of breath. Worried about pulmonary issues. Remote history of sarcoidosis which has been stable for a while. Worried about possibility of heart disease. She also has glucose intolerance. Issues with musculoskeletal pain.   Review of Systems     Objective:   Physical Exam  Spent 20 minutes speaking with patient today about these issues. She needs to focus on herself- try the diet exercise and lose weight. She may want to go to counseling.  Skin warm and dry. Nodes none. No JVD thyromegaly or carotid bruits. Chest clear to auscultation. Cardiac exam regular rate and rhythm normal S1 and S2. Extremities without edema      Assessment & Plan:   Obesity-once again counseled regarding diet exercise and weight loss  Hypertension-much improved with increasing Lasix from 20-40 mg daily. Basic metabolic panel drawn and is within normal limits  Dependent edema-improved with Lasix  Impaired glucose tolerance-currently diet controlled  Grief reaction-being treated with Prozac for depression and grief  Musculoskeletal pain-prescribed Flexeril 10 mg 1/2-1 tablet at bedtime  Plan: Patient complaining of shortness of breath. Has history of sarcoidosis. Chest x-ray performed today shows no change from previous chest X-Ray. Cardiology and pulmonary consultations. Orders entered for these.   Plan: Referrals made to pulmonology and cardiology. Return in 3 months.

## 2014-06-04 ENCOUNTER — Telehealth: Payer: Self-pay | Admitting: Internal Medicine

## 2014-06-04 NOTE — Telephone Encounter (Signed)
Message not needed. Told her pt was only scheduled for pft. She will check and call us back.

## 2014-07-11 ENCOUNTER — Other Ambulatory Visit: Payer: BC Managed Care – PPO | Admitting: Internal Medicine

## 2014-07-11 DIAGNOSIS — Z131 Encounter for screening for diabetes mellitus: Secondary | ICD-10-CM

## 2014-07-11 DIAGNOSIS — I10 Essential (primary) hypertension: Secondary | ICD-10-CM

## 2014-07-11 LAB — BASIC METABOLIC PANEL
BUN: 14 mg/dL (ref 6–23)
CO2: 25 meq/L (ref 19–32)
Calcium: 9.1 mg/dL (ref 8.4–10.5)
Chloride: 105 mEq/L (ref 96–112)
Creat: 0.93 mg/dL (ref 0.50–1.10)
GLUCOSE: 96 mg/dL (ref 70–99)
POTASSIUM: 4 meq/L (ref 3.5–5.3)
SODIUM: 140 meq/L (ref 135–145)

## 2014-07-11 LAB — HEMOGLOBIN A1C
Hgb A1c MFr Bld: 5.7 % — ABNORMAL HIGH (ref <5.7)
Mean Plasma Glucose: 117 mg/dL — ABNORMAL HIGH (ref <117)

## 2014-07-12 ENCOUNTER — Ambulatory Visit: Payer: BC Managed Care – PPO | Admitting: Internal Medicine

## 2014-07-25 ENCOUNTER — Ambulatory Visit (INDEPENDENT_AMBULATORY_CARE_PROVIDER_SITE_OTHER): Payer: BC Managed Care – PPO | Admitting: Internal Medicine

## 2014-07-25 ENCOUNTER — Encounter: Payer: Self-pay | Admitting: Internal Medicine

## 2014-07-25 DIAGNOSIS — R5381 Other malaise: Secondary | ICD-10-CM

## 2014-07-25 DIAGNOSIS — R609 Edema, unspecified: Secondary | ICD-10-CM

## 2014-07-25 DIAGNOSIS — Z8639 Personal history of other endocrine, nutritional and metabolic disease: Secondary | ICD-10-CM

## 2014-07-25 DIAGNOSIS — M48061 Spinal stenosis, lumbar region without neurogenic claudication: Secondary | ICD-10-CM

## 2014-07-25 DIAGNOSIS — R0602 Shortness of breath: Secondary | ICD-10-CM

## 2014-07-25 DIAGNOSIS — I10 Essential (primary) hypertension: Secondary | ICD-10-CM

## 2014-07-25 DIAGNOSIS — Z862 Personal history of diseases of the blood and blood-forming organs and certain disorders involving the immune mechanism: Secondary | ICD-10-CM

## 2014-07-25 DIAGNOSIS — R5383 Other fatigue: Secondary | ICD-10-CM

## 2014-07-25 DIAGNOSIS — R7309 Other abnormal glucose: Secondary | ICD-10-CM

## 2014-07-25 DIAGNOSIS — R7302 Impaired glucose tolerance (oral): Secondary | ICD-10-CM

## 2014-07-25 DIAGNOSIS — K219 Gastro-esophageal reflux disease without esophagitis: Secondary | ICD-10-CM

## 2014-07-25 DIAGNOSIS — Z8659 Personal history of other mental and behavioral disorders: Secondary | ICD-10-CM

## 2014-07-26 ENCOUNTER — Telehealth: Payer: Self-pay

## 2014-07-26 NOTE — Telephone Encounter (Signed)
Left message for patient to call the office to inform her that she has an appointment at [facility] Oct 7 at 3pm with Dr Acie Fredrickson.

## 2014-08-21 ENCOUNTER — Ambulatory Visit (INDEPENDENT_AMBULATORY_CARE_PROVIDER_SITE_OTHER): Payer: BC Managed Care – PPO | Admitting: Cardiovascular Disease

## 2014-08-21 ENCOUNTER — Encounter: Payer: Self-pay | Admitting: Cardiovascular Disease

## 2014-08-21 VITALS — BP 132/80 | HR 85 | Ht 78.0 in | Wt 259.4 lb

## 2014-08-21 DIAGNOSIS — R9431 Abnormal electrocardiogram [ECG] [EKG]: Secondary | ICD-10-CM

## 2014-08-21 DIAGNOSIS — R0600 Dyspnea, unspecified: Secondary | ICD-10-CM

## 2014-08-21 DIAGNOSIS — R0789 Other chest pain: Secondary | ICD-10-CM | POA: Insufficient documentation

## 2014-08-21 DIAGNOSIS — R0609 Other forms of dyspnea: Secondary | ICD-10-CM | POA: Insufficient documentation

## 2014-08-21 NOTE — Assessment & Plan Note (Addendum)
She has  episodes of chest tightness. She has poor R-wave progression on her EKG. We'll order a 2 day Lexiscan  Myoview study.  Addendum: The myoview showed attenuation in the inferior basal segments with a mildly reduced LV EF of 49%. The echo also showed hypokinesis of the inferior base. Given her symptoms of exertional chest tightness, I think we should proceed with cardiac cath. Discussed risks, benefits, options .  She understands and agrees to proceed.

## 2014-08-21 NOTE — Assessment & Plan Note (Addendum)
Will check an echo to look at her LV function.  She has evidence of LVH on ECG. She likely has LVH with diastolic dysfunction.    She has poor R-wave progression on her EKG. This may be due to lead placement but also may have been a prior silent MI. We'll check a stress Myoview study.  She would definitely benefit from weight loss.   She has a long history of obesity. I've encouraged her to start an exercise program.

## 2014-08-21 NOTE — Progress Notes (Signed)
     Kristine [name] Date of Birth  1951-04-30       [facility]    [employer] [address], [place], [place]  [address]   [place], [place]  [address] [address]   Fax  [phone]     Fax [phone]  Problem List: 1. Dyspnea 2. Chest tightness 3. Sarcoidosis   History of Present Illness:  Kristine [name] is a 63 yo - referred by Dr. Renold Genta for evaluation for dyspnea and chest tightness. The tightness occurs with walking.  Does not walk regularly.   Has had sarcoidosis but this has not been an issue.    Non smoker  ETOH - drinks a glass of white wine every day.     Current Outpatient Prescriptions on File Prior to Visit  Medication Sig Dispense Refill  . lisinopril (PRINIVIL,ZESTRIL) 20 MG tablet TAKE ONE TABLET BY MOUTH EVERY DAY  30 tablet  5  . cyclobenzaprine (FLEXERIL) 10 MG tablet Take 1 tablet (10 mg total) by mouth at bedtime.  30 tablet  0  . furosemide (LASIX) 20 MG tablet Take 2 tablets (40 mg total) by mouth daily.  30 tablet  3   No current facility-administered medications on file prior to visit.    Allergies  Allergen Reactions  . Penicillins     Past Medical History  Diagnosis Date  . Hypertension   . Sarcoidosis   . Glaucoma     ROD  . Allergy   . Arthritis     Past Surgical History  Procedure Laterality Date  . Cholecystectomy    . Abdominal hysterectomy    . Knee surgery      right  . Bunionectomy      left    History  Smoking status  . Never Smoker   Smokeless tobacco  . Never Used    History  Alcohol Use No    Family History  Problem Relation Age of Onset  . Breast cancer Mother   . Cancer Mother     lung   . Lung cancer Father   . Heart disease Maternal Grandmother     Reviw of Systems:  Reviewed in the HPI.  All other systems are negative.  Physical Exam: Blood pressure 132/80, pulse 85, height _0  (1.981 m), weight 259 lb 6.4 oz  (117.663 kg). Wt Readings from Last 3 Encounters:  08/21/14 259 lb 6.4 oz (117.663 kg)  07/25/14 254 lb (115.214 kg)  12/10/13 247 lb (112.038 kg)     General: Well developed, well nourished, in no acute distress.  Head: Normocephalic, atraumatic, sclera non-icteric, mucus membranes are moist,   Neck: Supple. Carotids are 2 + without bruits. No JVD   Lungs: Clear   Heart: RR,  Normal S1S2,  No murmurs  Abdomen: Soft, non-tender, non-distended with normal bowel sounds.  Msk:  Strength and tone are normal   Extremities: No clubbing or cyanosis. No edema.  Distal pedal pulses are 2+ and equal    Neuro: CN II - XII intact.  Alert and oriented X 3.   Psych:  Normal  ECG: Oct. 7, 2015:  NSR at 85.  LAD, voltage for LVH. Poor R wave progression.  Assessment / Plan:

## 2014-08-21 NOTE — Patient Instructions (Signed)
Your physician has requested that you have a lexiscan myoview. For further information please visit [email or web address]. Please follow instruction sheet, as given.  Your physician has requested that you have an echocardiogram. Echocardiography is a painless test that uses sound waves to create images of your heart. It provides your doctor with information about the size and shape of your heart and how well your heart's chambers and valves are working. This procedure takes approximately one hour. There are no restrictions for this procedure.  Your physician recommends that you continue on your current medications as directed. Please refer to the Current Medication list given to you today.  Your physician recommends that you schedule a follow-up appointment in: 3 months with Dr. Acie Fredrickson.

## 2014-09-03 ENCOUNTER — Ambulatory Visit (HOSPITAL_COMMUNITY): Payer: BC Managed Care – PPO | Attending: Cardiovascular Disease | Admitting: Radiology

## 2014-09-03 ENCOUNTER — Ambulatory Visit (HOSPITAL_BASED_OUTPATIENT_CLINIC_OR_DEPARTMENT_OTHER): Payer: BC Managed Care – PPO | Admitting: Cardiology

## 2014-09-03 VITALS — BP 146/90 | Ht 66.0 in | Wt 258.0 lb

## 2014-09-03 DIAGNOSIS — R0609 Other forms of dyspnea: Secondary | ICD-10-CM | POA: Diagnosis not present

## 2014-09-03 DIAGNOSIS — R0789 Other chest pain: Secondary | ICD-10-CM | POA: Diagnosis present

## 2014-09-03 DIAGNOSIS — R0600 Dyspnea, unspecified: Secondary | ICD-10-CM

## 2014-09-03 DIAGNOSIS — I10 Essential (primary) hypertension: Secondary | ICD-10-CM | POA: Insufficient documentation

## 2014-09-03 DIAGNOSIS — R9431 Abnormal electrocardiogram [ECG] [EKG]: Secondary | ICD-10-CM | POA: Insufficient documentation

## 2014-09-03 DIAGNOSIS — R002 Palpitations: Secondary | ICD-10-CM | POA: Insufficient documentation

## 2014-09-03 MED ORDER — REGADENOSON 0.4 MG/5ML IV SOLN
0.4000 mg | Freq: Once | INTRAVENOUS | Status: AC
  Administered 2014-09-03: 0.4 mg via INTRAVENOUS

## 2014-09-03 MED ORDER — TECHNETIUM TC 99M SESTAMIBI GENERIC - CARDIOLITE
30.0000 | Freq: Once | INTRAVENOUS | Status: AC | PRN
  Administered 2014-09-03: 30 via INTRAVENOUS

## 2014-09-03 NOTE — Progress Notes (Signed)
Echo performed.

## 2014-09-03 NOTE — Progress Notes (Signed)
[facility] [address] [place], [place] [address] [phone]    Cardiology Nuclear Med Study  Kristine [name] is a 63 y.o. female     MRN : [id number]     DOB: 1951/03/30  Procedure Date: 09/03/2014  Nuclear Med Background Indication for Stress Test:  Evaluation for Ischemia and Abnormal EKG History:  Asthma Cardiac Risk Factors: Hypertension  Symptoms:  Chest Tightness, DOE, Palpitations and SOB   Nuclear Pre-Procedure Caffeine/Decaff Intake:  None NPO After: 7:30pm   Lungs:  clear O2 Sat: 95% on room air. IV 0.9% NS with Angio Cath:  22g  IV Site: R Hand  IV Started by:  Matilde Haymaker, RN  Chest Size (in):  46 Cup Size: B  Height: 5' 6" (1.676 m)  Weight:  258 lb (117.028 kg)  BMI:  Body mass index is 41.66 kg/(m^2). Tech Comments:  n/a    Nuclear Med Study 1 or 2 day study: 2 day  Stress Test Type:  Treadmill/Lexiscan  Reading MD: n/a  Order Authorizing Provider:  Natale Lay  Resting Radionuclide: Technetium 61mSestamibi  Resting Radionuclide Dose: 33.0 mCi on 09/06/14   Stress Radionuclide:  Technetium 91mestamibi  Stress Radionuclide Dose: 33.0 mCi on 09/03/14           Stress Protocol Rest HR: 73 Stress HR: 108  Rest BP: 146/90 Stress BP: 227/124  Exercise Time (min): n/a METS: n/a   Predicted Max HR: 159 bpm % Max HR: 67.92 bpm Rate Pressure Product: 24516   Dose of Adenosine (mg):  n/a Dose of Lexiscan: 0.4 mg  Dose of Atropine (mg): n/a Dose of Dobutamine: n/a mcg/kg/min (at max HR)  Stress Test Technologist: SaPerrin MalteseEMT-P  Nuclear Technologist:  ElEarl ManyCNMT     Rest Procedure:  Myocardial perfusion imaging was performed at rest 45 minutes following the intravenous administration of Technetium 9936mstamibi. Rest ECG: NSR with non-specific ST-T wave changes and Normal sinus rhythm. Old anterior MI.  Stress Procedure:  The patient received IV Lexiscan 0.4 mg over 15-seconds with  concurrent low level exercise and then Technetium 76m26mtamibi was injected at 30-seconds while the patient continued walking one more minute. This patient was dizzy and had a headache with the Lexiscan injection. Quantitative spect images were obtained after a 45-minute delay. Stress ECG: No significant change from baseline ECG  QPS Raw Data Images:  Normal; no motion artifact; normal heart/lung ratio. Stress Images:  Small area of mild decreased uptake at the base/mid inferolateral wall. Rest Images:  Rest images appear the same as the stress images. Subtraction (SDS):  No evidence of ischemia. Transient Ischemic Dilatation (Normal <1.22):  1.08 Lung/Heart Ratio (Normal <0.45):  0.27  Quantitative Gated Spect Images QGS EDV:  141 ml QGS ESV:  72 ml  Impression Exercise Capacity:  Lexiscan with low level exercise. BP Response:  Hypertensive blood pressure response. Clinical Symptoms:  Dizziness ECG Impression:  No significant ST segment change suggestive of ischemia. Comparison with Prior Nuclear Study: No previous nuclear study performed  Overall Impression:  Mildly abnormal study. There is question of very mild scar at the base of the inferolateral wall. There is no significant ischemia. This is a low-risk scan.  LV Ejection Fraction: 49%.  LV Wall Motion:  There may be slight relative hypokinesis at the base of the lateral wall.  JeffDola Argyle

## 2014-09-06 ENCOUNTER — Ambulatory Visit (HOSPITAL_COMMUNITY): Payer: BC Managed Care – PPO | Attending: Cardiovascular Disease

## 2014-09-06 MED ORDER — TECHNETIUM TC 99M SESTAMIBI GENERIC - CARDIOLITE
33.0000 | Freq: Once | INTRAVENOUS | Status: AC | PRN
  Administered 2014-09-06: 33 via INTRAVENOUS

## 2014-09-10 ENCOUNTER — Encounter: Payer: Self-pay | Admitting: Nurse Practitioner

## 2014-09-10 ENCOUNTER — Telehealth: Payer: Self-pay | Admitting: Nurse Practitioner

## 2014-09-10 DIAGNOSIS — I200 Unstable angina: Secondary | ICD-10-CM

## 2014-09-10 MED ORDER — ASPIRIN EC 81 MG PO TBEC: 81.0000 mg | DELAYED_RELEASE_TABLET | Freq: Every day | ORAL | Status: DC

## 2014-09-10 NOTE — Telephone Encounter (Signed)
Spoke with patient and reviewed pre-cath instructions and scheduled patient for lab appointment on 10/28.  I advised patient to call back with questions or concerns.  Patient verbalized understanding of instructions and plan of care.

## 2014-09-10 NOTE — Telephone Encounter (Signed)
Pt calle to change the day of the Cath. Requests a call back to schedule for 09/12/2014// Please call

## 2014-09-10 NOTE — Telephone Encounter (Signed)
Per Dr. Acie Fredrickson: I have reviewed the echo and the nuclear study.  In putting both tests together, she has attenuation of the inferior base.  The myoview shows a mildly reduced LV function.  She has exertional CP / tightness.   I think she needs a cath.

## 2014-09-11 ENCOUNTER — Other Ambulatory Visit (INDEPENDENT_AMBULATORY_CARE_PROVIDER_SITE_OTHER): Payer: BC Managed Care – PPO | Admitting: *Deleted

## 2014-09-11 ENCOUNTER — Encounter (HOSPITAL_COMMUNITY): Payer: Self-pay | Admitting: Pharmacy Technician

## 2014-09-11 DIAGNOSIS — I200 Unstable angina: Secondary | ICD-10-CM

## 2014-09-11 LAB — CBC WITH DIFFERENTIAL/PLATELET
BASOS PCT: 0.5 % (ref 0.0–3.0)
Basophils Absolute: 0 10*3/uL (ref 0.0–0.1)
Eosinophils Absolute: 0.2 10*3/uL (ref 0.0–0.7)
Eosinophils Relative: 2.4 % (ref 0.0–5.0)
HCT: 34.4 % — ABNORMAL LOW (ref 36.0–46.0)
HEMOGLOBIN: 11.3 g/dL — AB (ref 12.0–15.0)
Lymphocytes Relative: 28 % (ref 12.0–46.0)
Lymphs Abs: 1.9 10*3/uL (ref 0.7–4.0)
MCHC: 33 g/dL (ref 30.0–36.0)
MCV: 78.2 fl (ref 78.0–100.0)
MONO ABS: 0.6 10*3/uL (ref 0.1–1.0)
Monocytes Relative: 8.5 % (ref 3.0–12.0)
Neutro Abs: 4.1 10*3/uL (ref 1.4–7.7)
Neutrophils Relative %: 60.6 % (ref 43.0–77.0)
Platelets: 234 10*3/uL (ref 150.0–400.0)
RBC: 4.4 Mil/uL (ref 3.87–5.11)
RDW: 14.3 % (ref 11.5–15.5)
WBC: 6.7 10*3/uL (ref 4.0–10.5)

## 2014-09-11 LAB — BASIC METABOLIC PANEL
BUN: 15 mg/dL (ref 6–23)
CO2: 19 meq/L (ref 19–32)
CREATININE: 0.9 mg/dL (ref 0.4–1.2)
Calcium: 9 mg/dL (ref 8.4–10.5)
Chloride: 110 mEq/L (ref 96–112)
GFR: 81.62 mL/min (ref >60.00)
GLUCOSE: 97 mg/dL (ref 70–99)
Potassium: 3.5 mEq/L (ref 3.5–5.1)
Sodium: 141 mEq/L (ref 135–145)

## 2014-09-11 LAB — PROTIME-INR
INR: 1.1 ratio — ABNORMAL HIGH (ref 0.8–1.0)
Prothrombin Time: 12 s (ref 9.6–13.1)

## 2014-09-12 ENCOUNTER — Ambulatory Visit (HOSPITAL_COMMUNITY)
Admission: RE | Admit: 2014-09-12 | Discharge: 2014-09-12 | Disposition: A | Discharge status: Home / Self Care | Payer: BC Managed Care – PPO | Source: Ambulatory Visit | Attending: Interventional Cardiology | Admitting: Interventional Cardiology

## 2014-09-12 ENCOUNTER — Encounter (HOSPITAL_COMMUNITY)
Admission: RE | Disposition: A | Discharge status: Home / Self Care | Payer: Self-pay | Source: Ambulatory Visit | Attending: Interventional Cardiology

## 2014-09-12 DIAGNOSIS — I2510 Atherosclerotic heart disease of native coronary artery without angina pectoris: Secondary | ICD-10-CM

## 2014-09-12 DIAGNOSIS — I209 Angina pectoris, unspecified: Secondary | ICD-10-CM

## 2014-09-12 DIAGNOSIS — R0602 Shortness of breath: Secondary | ICD-10-CM | POA: Insufficient documentation

## 2014-09-12 DIAGNOSIS — D869 Sarcoidosis, unspecified: Secondary | ICD-10-CM | POA: Diagnosis not present

## 2014-09-12 DIAGNOSIS — I10 Essential (primary) hypertension: Secondary | ICD-10-CM | POA: Diagnosis not present

## 2014-09-12 DIAGNOSIS — R0789 Other chest pain: Secondary | ICD-10-CM | POA: Diagnosis present

## 2014-09-12 HISTORY — PX: LEFT HEART CATHETERIZATION WITH CORONARY ANGIOGRAM: SHX5451

## 2014-09-12 SURGERY — LEFT HEART CATHETERIZATION WITH CORONARY ANGIOGRAM
Anesthesia: LOCAL

## 2014-09-12 MED ORDER — SODIUM CHLORIDE 0.9 % IV SOLN: 250.0000 mL | INTRAVENOUS | Status: DC | PRN

## 2014-09-12 MED ORDER — MIDAZOLAM HCL 2 MG/2ML IJ SOLN
INTRAMUSCULAR | Status: AC
  Filled 2014-09-12: qty 2

## 2014-09-12 MED ORDER — FENTANYL CITRATE 0.05 MG/ML IJ SOLN
INTRAMUSCULAR | Status: AC
  Filled 2014-09-12: qty 2

## 2014-09-12 MED ORDER — ACETAMINOPHEN 325 MG PO TABS: 650.0000 mg | ORAL_TABLET | ORAL | Status: DC | PRN

## 2014-09-12 MED ORDER — LIDOCAINE HCL (PF) 1 % IJ SOLN
INTRAMUSCULAR | Status: AC
  Filled 2014-09-12: qty 30

## 2014-09-12 MED ORDER — SODIUM CHLORIDE 0.9 % IJ SOLN: 3.0000 mL | Freq: Two times a day (BID) | INTRAMUSCULAR | Status: DC

## 2014-09-12 MED ORDER — NITROGLYCERIN 1 MG/10 ML FOR IR/CATH LAB
INTRA_ARTERIAL | Status: AC
  Filled 2014-09-12: qty 10

## 2014-09-12 MED ORDER — ASPIRIN EC 81 MG PO TBEC: 81.0000 mg | DELAYED_RELEASE_TABLET | Freq: Every day | ORAL | Status: DC

## 2014-09-12 MED ORDER — SODIUM CHLORIDE 0.9 % IJ SOLN
3.0000 mL | INTRAMUSCULAR | Status: DC | PRN
Start: 2014-09-12 — End: 2014-09-12

## 2014-09-12 MED ORDER — METOPROLOL SUCCINATE ER 25 MG PO TB24: 25.0000 mg | ORAL_TABLET | Freq: Every day | ORAL | Status: DC

## 2014-09-12 MED ORDER — HEPARIN SODIUM (PORCINE) 1000 UNIT/ML IJ SOLN
INTRAMUSCULAR | Status: AC
  Filled 2014-09-12: qty 1

## 2014-09-12 MED ORDER — VERAPAMIL HCL 2.5 MG/ML IV SOLN
INTRAVENOUS | Status: AC
  Filled 2014-09-12: qty 2

## 2014-09-12 MED ORDER — SODIUM CHLORIDE 0.9 % IV SOLN
INTRAVENOUS | Status: DC
  Administered 2014-09-12: 07:00:00 via INTRAVENOUS

## 2014-09-12 MED ORDER — ONDANSETRON HCL 4 MG/2ML IJ SOLN: 4.0000 mg | Freq: Four times a day (QID) | INTRAMUSCULAR | Status: DC | PRN

## 2014-09-12 MED ORDER — ASPIRIN 81 MG PO CHEW: 81.0000 mg | CHEWABLE_TABLET | ORAL | Status: DC

## 2014-09-12 MED ORDER — SODIUM CHLORIDE 0.9 % IV SOLN: INTRAVENOUS | Status: DC

## 2014-09-12 MED ORDER — HEPARIN (PORCINE) IN NACL 2-0.9 UNIT/ML-% IJ SOLN
INTRAMUSCULAR | Status: AC
  Filled 2014-09-12: qty 1500

## 2014-09-12 NOTE — H&P (View-Only) (Signed)
     Sashia F Wiegman Date of Birth  09/11/1951       [facility] [place] [facility]    [facility] [address], [address]  [address] [place], [place]  [address]   [place],   [address] [phone]     [phone]   Fax  [phone]     Fax [phone]  Problem List: 1. Dyspnea 2. Chest tightness 3. Sarcoidosis   History of Present Illness:  Kristine [name] is a 63 yo - referred by Dr. Baxley for evaluation for dyspnea and chest tightness. The tightness occurs with walking.  Does not walk regularly.   Has had sarcoidosis but this has not been an issue.    Non smoker  ETOH - drinks a glass of white wine every day.     Current Outpatient Prescriptions on File Prior to Visit  Medication Sig Dispense Refill  . lisinopril (PRINIVIL,ZESTRIL) 20 MG tablet TAKE ONE TABLET BY MOUTH EVERY DAY  30 tablet  5  . cyclobenzaprine (FLEXERIL) 10 MG tablet Take 1 tablet (10 mg total) by mouth at bedtime.  30 tablet  0  . furosemide (LASIX) 20 MG tablet Take 2 tablets (40 mg total) by mouth daily.  30 tablet  3   No current facility-administered medications on file prior to visit.    Allergies  Allergen Reactions  . Penicillins     Past Medical History  Diagnosis Date  . Hypertension   . Sarcoidosis   . Glaucoma     ROD  . Allergy   . Arthritis     Past Surgical History  Procedure Laterality Date  . Cholecystectomy    . Abdominal hysterectomy    . Knee surgery      right  . Bunionectomy      left    History  Smoking status  . Never Smoker   Smokeless tobacco  . Never Used    History  Alcohol Use No    Family History  Problem Relation Age of Onset  . Breast cancer Mother   . Cancer Mother     lung   . Lung cancer Father   . Heart disease Maternal Grandmother     Reviw of Systems:  Reviewed in the HPI.  All other systems are negative.  Physical Exam: Blood pressure 132/80, pulse 85, height 6' 6" (1.981 m), weight 259 lb 6.4 oz  (117.663 kg). Wt Readings from Last 3 Encounters:  08/21/14 259 lb 6.4 oz (117.663 kg)  07/25/14 254 lb (115.214 kg)  12/10/13 247 lb (112.038 kg)     General: Well developed, well nourished, in no acute distress.  Head: Normocephalic, atraumatic, sclera non-icteric, mucus membranes are moist,   Neck: Supple. Carotids are 2 + without bruits. No JVD   Lungs: Clear   Heart: RR,  Normal S1S2,  No murmurs  Abdomen: Soft, non-tender, non-distended with normal bowel sounds.  Msk:  Strength and tone are normal   Extremities: No clubbing or cyanosis. No edema.  Distal pedal pulses are 2+ and equal    Neuro: CN II - XII intact.  Alert and oriented X 3.   Psych:  Normal  ECG: Oct. 7, 2015:  NSR at 85.  LAD, voltage for LVH. Poor R wave progression.  Assessment / Plan:   

## 2014-09-12 NOTE — CV Procedure (Signed)
Kristine [name] is a 63 y.o. female   [id number]  [id number] LOCATION:  FACILITY: [facility]  PHYSICIAN: Troy Sine, MD, [facility] 09/26/1951   DATE OF PROCEDURE:  09/12/2014     CARDIAC CATHETERIZATION    HISTORY:   Kristine [name] is a 63 year old African-American female who was a history of hypertension and sarcoidosis.  She is developed episodes of exertional chest tightness and shortness of breath.  Lexiscan myocardial perfusion study was mildly abnormal and raised the possibility of basal inferolateral scar without definitive ischemia.  Ejection fraction was 49%.  She is now referred for definitive cardiac catheterization.   PROCEDURE:  Left heart catheterization via the right radial artery; coronary angiography, left ventriculography  The patient was brought to the second floor [facility] cath lab in the postabsorptive state. The patient was premedicated with Versed 2 mg and fentanyl 25 mcg. A right radial approach was utilized after an Allen's test verified adequate circulation. The right radial artery was punctured via the Seldinger technique, and a 6 [place] Glidesheath Slender was inserted without difficulty.  A radial cocktail consisting of Verapamil, IV nitroglycerin, and lidocaine was administered. Weight adjusted heparin was administered. A safety J wire was advanced into the ascending aorta. Diagnostic catheterization was done with 5 [place] TIG 4.0 catheter. A 5 French pigtail catheter was used for left ventriculography. A TR radial band was applied for hemostasis. The patient left the catheterization laboratory in stable condition.   HEMODYNAMICS:   Central Aorta: 150/79  Left Ventricle: 150/11  ANGIOGRAPHY:   The left main coronary artery was angiographically normal and bifurcated into the LAD and left circumflex coronary artery.   The LAD was a moderate size vessel gave rise to 2 major diagonal vessels and several septal perforating arteries. The vessel  extended to the LV apex.  There was a smooth 40% near ostial narrowing.  The left circumflex coronary artery was angiographically normal and gave rise to one major bifurcating obtuse marginal branch.   The RCA was a large caliber dominant angiographically normal .  So, which gave rise to a large PDA and PLA vessel.   Left ventriculography revealed normal global LV contractility without focal segmental wall motion abnormalities. There was no evidence for mitral regurgitation.  Ejection fraction is 55%.   Total contrast used: 85 cc Omnipaque  IMPRESSION:  Mild coronary artery disease with smooth 40% near ostial narrowing of the left anterior descending artery and otherwise normal coronary arteries.  Normal LV function.   RECOMMENDATION:  Medical Therapy.   Troy Sine, MD,  [facility] 09/12/2014 12:35 PM

## 2014-09-12 NOTE — Progress Notes (Signed)
17cc of air reported to be in TRB. 1130 all air removed.from band.

## 2014-09-12 NOTE — Discharge Instructions (Signed)
Radial Site Care Refer to this sheet in the next few weeks. These instructions provide you with information on caring for yourself after your procedure. Your caregiver may also give you more specific instructions. Your treatment has been planned according to current medical practices, but problems sometimes occur. Call your caregiver if you have any problems or questions after your procedure. HOME CARE INSTRUCTIONS  You may shower the day after the procedure.Remove the bandage (dressing) and gently wash the site with plain soap and water.Gently pat the site dry.  Do not apply powder or lotion to the site.  Do not submerge the affected site in water for 3 to 5 days.  Inspect the site at least twice daily.  Do not flex or bend the affected arm for 24 hours.  No lifting over 5 pounds (2.3 kg) for 5 days after your procedure.  Do not drive home if you are discharged the same day of the procedure. Have someone else drive you.  You may drive 24 hours after the procedure unless otherwise instructed by your caregiver.  Do not operate machinery or power tools for 24 hours.  A responsible adult should be with you for the first 24 hours after you arrive home. What to expect:  Any bruising will usually fade within 1 to 2 weeks.  Blood that collects in the tissue (hematoma) may be painful to the touch. It should usually decrease in size and tenderness within 1 to 2 weeks. SEEK IMMEDIATE MEDICAL CARE IF:  You have unusual pain at the radial site.  You have redness, warmth, swelling, or pain at the radial site.  You have drainage (other than a small amount of blood on the dressing).  You have chills.  You have a fever or persistent symptoms for more than 72 hours.  You have a fever and your symptoms suddenly get worse.  Your arm becomes pale, cool, tingly, or numb.  You have heavy bleeding from the site. Hold pressure on the site. Document Released: 12/04/2010 Document Revised:  01/24/2012 Document Reviewed: 12/04/2010 [facility] Patient Information 2015 [place], [place]. This information is not intended to replace advice given to you by your health care provider. Make sure you discuss any questions you have with your health care provider.   Metoprolol extended-release tablets What is this medicine? METOPROLOL (me TOE proe lole) is a beta-blocker. Beta-blockers reduce the workload on the heart and help it to beat more regularly. This medicine is used to treat high blood pressure and to prevent chest pain. It is also used to after a heart attack and to prevent an additional heart attack from occurring. This medicine may be used for other purposes; ask your health care provider or pharmacist if you have questions. COMMON BRAND NAME(S): Toprol XL What should I tell my health care provider before I take this medicine? They need to know if you have any of these conditions: -diabetes -heart or vessel disease like slow heart rate, worsening heart failure, heart block, sick sinus syndrome or Raynaud's disease -kidney disease -liver disease -lung or breathing disease, like asthma or emphysema -pheochromocytoma -thyroid disease -an unusual or allergic reaction to metoprolol, other beta-blockers, medicines, foods, dyes, or preservatives -pregnant or trying to get pregnant -breast-feeding How should I use this medicine? Take this medicine by mouth with a glass of water. Follow the directions on the prescription label. Do not crush or chew. Take this medicine with or immediately after meals. Take your doses at regular intervals. Do not take more  medicine than directed. Do not stop taking this medicine suddenly. This could lead to serious heart-related effects. Talk to your pediatrician regarding the use of this medicine in children. While this drug may be prescribed for children as young as 6 years for selected conditions, precautions do apply. Overdosage: If you think you have  taken too much of this medicine contact a poison control center or emergency room at once. NOTE: This medicine is only for you. Do not share this medicine with others. What if I miss a dose? If you miss a dose, take it as soon as you can. If it is almost time for your next dose, take only that dose. Do not take double or extra doses. What may interact with this medicine? This medicine may interact with the following medications: -certain medicines for blood pressure, heart disease, irregular heart beat -certain medicines for depression, like monoamine oxidase (MAO) inhibitors, fluoxetine, or paroxetine -clonidine -dobutamine -epinephrine -isoproterenol -reserpine This list may not describe all possible interactions. Give your health care provider a list of all the medicines, herbs, non-prescription drugs, or dietary supplements you use. Also tell them if you smoke, drink alcohol, or use illegal drugs. Some items may interact with your medicine. What should I watch for while using this medicine? Visit your doctor or health care professional for regular check ups. Contact your doctor right away if your symptoms worsen. Check your blood pressure and pulse rate regularly. Ask your health care professional what your blood pressure and pulse rate should be, and when you should contact them. You may get drowsy or dizzy. Do not drive, use machinery, or do anything that needs mental alertness until you know how this medicine affects you. Do not sit or stand up quickly, especially if you are an older patient. This reduces the risk of dizzy or fainting spells. Contact your doctor if these symptoms continue. Alcohol may interfere with the effect of this medicine. Avoid alcoholic drinks. What side effects may I notice from receiving this medicine? Side effects that you should report to your doctor or health care professional as soon as possible: -allergic reactions like skin rash, itching or hives -cold or  numb hands or feet -depression -difficulty breathing -faint -fever with sore throat -irregular heartbeat, chest pain -rapid weight gain -swollen legs or ankles Side effects that usually do not require medical attention (report to your doctor or health care professional if they continue or are bothersome): -anxiety or nervousness -change in sex drive or performance -dry skin -headache -nightmares or trouble sleeping -short term memory loss -stomach upset or diarrhea -unusually tired This list may not describe all possible side effects. Call your doctor for medical advice about side effects. You may report side effects to FDA at 1-800-FDA-1088. Where should I keep my medicine? Keep out of the reach of children. Store at room temperature between 15 and 30 degrees C (59 and 86 degrees F). Throw away any unused medicine after the expiration date. NOTE: This sheet is a summary. It may not cover all possible information. If you have questions about this medicine, talk to your doctor, pharmacist, or health care provider.  2015, Elsevier/Gold Standard. (2013-07-06 14:41:37)

## 2014-09-12 NOTE — Interval H&P Note (Signed)
Cath Lab Visit (complete for each Cath Lab visit)  Clinical Evaluation Leading to the Procedure:   ACS: No.  Non-ACS:    Anginal Classification: CCS III  Anti-ischemic medical therapy: Minimal Therapy (1 class of medications)  Non-Invasive Test Results: Low-risk stress test findings: cardiac mortality <1%/year  Prior CABG: No previous CABG      History and Physical Interval Note:  09/12/2014 9:13 AM  Kristine [name]  has presented today for surgery, with the diagnosis of unstable angina  The various methods of treatment have been discussed with the patient and family. After consideration of risks, benefits and other options for treatment, the patient has consented to  Procedure(s): LEFT HEART CATHETERIZATION WITH CORONARY ANGIOGRAM (N/A) as a surgical intervention .  The patient's history has been reviewed, patient examined, no change in status, stable for surgery.  I have reviewed the patient's chart and labs.  Questions were answered to the patient's satisfaction.     Kristine [name] A

## 2014-09-16 ENCOUNTER — Encounter: Payer: Self-pay | Admitting: Cardiovascular Disease

## 2014-09-19 ENCOUNTER — Telehealth: Payer: Self-pay | Admitting: Internal Medicine

## 2014-09-19 MED ORDER — LISINOPRIL 20 MG PO TABS: 20.0000 mg | ORAL_TABLET | Freq: Every day | ORAL | Status: DC

## 2014-09-19 NOTE — Telephone Encounter (Signed)
Please refill her BP medication.  Pharmacy:  [facility].  States that they said they haven't heard a response from their request to [place] yet.

## 2014-09-19 NOTE — Telephone Encounter (Signed)
Lisinopril refill sent to pharmacy.  Patient aware.

## 2014-09-20 ENCOUNTER — Telehealth: Payer: Self-pay

## 2014-09-20 DIAGNOSIS — D869 Sarcoidosis, unspecified: Secondary | ICD-10-CM

## 2014-09-20 NOTE — Telephone Encounter (Signed)
Referral to [facility] Pulmonology.  Appt 09/30/2014 at 11am.  Patient aware.

## 2014-09-30 ENCOUNTER — Encounter: Payer: Self-pay | Admitting: Internal Medicine

## 2014-09-30 ENCOUNTER — Ambulatory Visit (INDEPENDENT_AMBULATORY_CARE_PROVIDER_SITE_OTHER): Payer: BC Managed Care – PPO | Admitting: Internal Medicine

## 2014-09-30 ENCOUNTER — Ambulatory Visit: Payer: BC Managed Care – PPO | Admitting: *Deleted

## 2014-09-30 VITALS — BP 140/86 | HR 80 | Ht 66.0 in | Wt 260.6 lb

## 2014-09-30 DIAGNOSIS — Z23 Encounter for immunization: Secondary | ICD-10-CM

## 2014-09-30 DIAGNOSIS — R0600 Dyspnea, unspecified: Secondary | ICD-10-CM

## 2014-09-30 DIAGNOSIS — I10 Essential (primary) hypertension: Secondary | ICD-10-CM

## 2014-09-30 MED ORDER — VALSARTAN 160 MG PO TABS: 160.0000 mg | ORAL_TABLET | Freq: Every day | ORAL | Status: DC

## 2014-09-30 NOTE — Patient Instructions (Addendum)
Stop lisinopril and start diovan 160 mg daily in its place  Try prilosec 72m  Take 30-60 min before first meal of the day and Pepcid 20 mg one bedtime   GERD (REFLUX)  is an extremely common cause of respiratory symptoms just like yours , many times with no obvious heartburn at all.    It can be treated with medication, but also with lifestyle changes including avoidance of late meals, excessive alcohol, smoking cessation, and avoid fatty foods, chocolate, peppermint, colas, red wine, and acidic juices such as orange juice.  NO MINT OR MENTHOL PRODUCTS SO NO COUGH DROPS  USE SUGARLESS CANDY INSTEAD (Jolley ranchers or Stover's or Life Savers) or even ice chips will also do - the key is to swallow to prevent all throat clearing. NO OIL BASED VITAMINS - use powdered substitutes.    If you are satisfied with your treatment plan,  let your doctor know and he/she can either refill your medications or you can return here when your prescription runs out.     If in any way you are not 100% satisfied,  please tell [place]  If 100% better, tell your friends!  Pulmonary follow up is as needed   Late add:  First step if not better = cxr

## 2014-09-30 NOTE — Progress Notes (Signed)
   Subjective:    Patient ID: Kristine [name], female    DOB: [birth year],    MRN: [id number]  HPI  64 yobf never smoker dx with sarcoid / iritis on and off prednisone mostly for eye issues with around 20 lbs with indolent onset of progressive sob x feb 2015 both at rest and also with exertion with some chest tightness and neg cardiac w/u including LHC 09/12/14 with nl LV  so referred to pulmonary clinic 09/30/2014 by Dr Luisa Dago Renold Genta    09/30/2014 1st [facility] office visit/ Latiya Navia   Chief Complaint  Patient presents with  . Pulmonary Consult    Referred by Dr. Renold Genta. Pt states that she was dxed with Sarcoid in 1998. She c/o increased SOB x 6 months. She states that she gets SOB with or without any exertion.    indolent onset gradually worse assoc with overt HB ? Worse x one year but not cough while on acei  Most ex s stopping = malls but slowed by fatgiue  No noct symptoms at all.  No obvious other patterns in day to day or daytime variabilty or assoc chronic cough or cp or chest tightness, subjective wheeze or overt sinus   symptoms. No unusual exp hx or h/o childhood pna/ asthma or knowledge of premature birth.  Sleeping ok without nocturnal  or early am exacerbation  of respiratory  c/o's or need for noct saba. Also denies any obvious fluctuation of symptoms with weather or environmental changes or other aggravating or alleviating factors except as outlined above   Current Medications, Allergies, Complete Past Medical History, Past Surgical History, Family History, and Social History were reviewed in [employer] record.           Review of Systems  Constitutional: Negative for fever, chills and unexpected weight change.  HENT: Negative for congestion, dental problem, ear pain, nosebleeds, postnasal drip, rhinorrhea, sinus pressure, sneezing, sore throat, trouble swallowing and voice change.   Eyes: Negative for visual disturbance.  Respiratory: Positive  for shortness of breath. Negative for cough and choking.   Cardiovascular: Negative for chest pain and leg swelling.  Gastrointestinal: Negative for vomiting, abdominal pain and diarrhea.  Genitourinary: Negative for difficulty urinating.       Acid heartburn  Musculoskeletal: Negative for arthralgias.  Skin: Negative for rash.  Neurological: Negative for tremors, syncope and headaches.  Hematological: Does not bruise/bleed easily.       Objective:   Physical Exam  amb bf nad  HEENT: nl dentition, turbinates, and orophanx. Nl external ear canals without cough reflex   NECK :  without JVD/Nodes/TM/ nl carotid upstrokes bilaterally   LUNGS: no acc muscle use, clear to A and P bilaterally without cough on insp or exp maneuvers   CV:  RRR  no s3 or murmur or increase in P2, no edema   ABD:  soft and nontender with nl excursion in the supine position. No bruits or organomegaly, bowel sounds nl  MS:  warm without deformities, calf tenderness, cyanosis or clubbing  SKIN: warm and dry without lesions    NEURO:  alert, approp, no deficits   CXR  09/30/2014 :  rec but not done            Assessment & Plan:

## 2014-10-03 NOTE — Assessment & Plan Note (Signed)
ACE inhibitors are problematic in  pts with airway complaints because  even experienced pulmonologists can't always distinguish ace effects from copd/asthma.  By themselves they don't actually cause a problem, much like oxygen can't by itself start a fire, but they certainly serve as a powerful catalyst or enhancer for any "fire"  or inflammatory process in the upper airway, be it caused by an ET  tube or more commonly reflux (especially in the obese or pts with known GERD or who are on biphoshonates).    In the era of ARB near equivalency until we have a better handle on the reversibility of the airway problem, it just makes sense to avoid ACEI  entirely in the short run  -  It's the only way to be sure  Pt was skeptical about this dx until I showed her a letter of thx I received  from another pt with the exact same symptoms and w/u (extensive neg cardiac) whose "life was changed" by the recognition and treatment of this problem (and she wasn't coughing either)  rec diovan 160 mg daily x min of 4 weeks  If not better return with cxr which I intended to do today

## 2014-10-03 NOTE — Assessment & Plan Note (Addendum)
01/11/14 pfts wnl 09/30/2014  Walked RA x 3 laps @ 185 ft each stopped due to  End of study, nl pace, no desat   Variable sob just as likely at rest as ex and not reproducible with ex = Symptoms are markedly disproportionate to objective findings and not clear this is a lung problem but pt does appear to have difficult airway management issues.  DDX of  difficult airways management all start with A and  include Adherence, Ace Inhibitors, Acid Reflux, Active Sinus Disease, Alpha 1 Antitripsin deficiency, Anxiety masquerading as Airways dz,  ABPA,  allergy(esp in young), Aspiration (esp in elderly), Adverse effects of DPI,  Active smokers, plus two Bs  = Bronchiectasis and Beta blocker use..and one C= CHF   ACEi at the top of the list of usual suspects even in the absence of classic cough  and no need to do further eval until off for at least a month - see hbp  ? 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

## 2014-10-06 NOTE — Patient Instructions (Addendum)
Pulmonary cardiology consultation is to be arranged. Encouraged diet exercise and weight loss. Continue Prozac.

## 2014-10-06 NOTE — Progress Notes (Signed)
   Subjective:    Patient ID: Kristine [name], female    DOB: [birth year], 63 y.o.   MRN: [id number]  HPI Patient was here in January. She has a history of obesity, sarcoidosis, hypertension, impaired glucose tolerance, metabolic syndrome, GE reflux, goiter. History of lumbar spinal stenosis. At that time she was complaining of some shortness of breath. Patient was referred to pulmonology and cardiology but it seems that she may have canceled these appointments. She's now complaining of more shortness of breath. Sarcoidosis has been under good control for a number of years. I believe she suffers from deconditioning. Chest x-ray in January was stable. She is on Prozac for depression, lisinopril, furosemide. History of dependent edema. Daughter suffered a miscarriage this summer. This was upsetting to patient and she came in for nurse visit 1 day complaining of headache and blood pressure check showed blood pressure be 831 systolically.  Basically she says she doesn't feel well and is quite vague about this. It is difficult to reassure her that nothing is wrong. However she does not seem motivated to diet exercise and lose weight. She has a fear of cancer.  Basic metabolic panel and hemoglobin A1c drawn in late August are stable.      Review of Systems     Objective:   Physical Exam Skin warm and dry. Nodes none. No JVD. Chest clear. Cardiac exam regular rate and rhythm. Trace lower extremity edema. Weight is 254 pounds       Assessment & Plan:  Morbid obesity  Metabolic syndrome  Impaired glucose tolerance  Dependent edema  History of sarcoidosis  History of lumbar spinal stenosis  History of hyperlipidemia  GE reflux  History of vitamin D deficiency  Plan: I spent over 30 minutes speaking with patient about a number of issues today. I do want her to see pulmonologist and cardiologist. She agrees to proceed with these consultations. Perhaps they can convince her to diet  exercise and lose weight. Otherwise return in 6 months

## 2014-10-24 ENCOUNTER — Encounter (HOSPITAL_COMMUNITY): Payer: Self-pay | Admitting: Cardiovascular Disease

## 2014-12-10 ENCOUNTER — Ambulatory Visit: Payer: BC Managed Care – PPO | Admitting: Cardiovascular Disease

## 2015-01-06 ENCOUNTER — Other Ambulatory Visit: Payer: Self-pay

## 2015-01-06 DIAGNOSIS — Z1231 Encounter for screening mammogram for malignant neoplasm of breast: Secondary | ICD-10-CM

## 2015-01-14 ENCOUNTER — Ambulatory Visit: Payer: BC Managed Care – PPO | Admitting: Internal Medicine

## 2015-01-15 ENCOUNTER — Encounter (INDEPENDENT_AMBULATORY_CARE_PROVIDER_SITE_OTHER): Payer: Self-pay

## 2015-01-15 ENCOUNTER — Ambulatory Visit
Admission: RE | Admit: 2015-01-15 | Discharge: 2015-01-15 | Disposition: A | Discharge status: Home / Self Care | Payer: BC Managed Care – PPO | Source: Ambulatory Visit

## 2015-01-15 DIAGNOSIS — Z1231 Encounter for screening mammogram for malignant neoplasm of breast: Secondary | ICD-10-CM

## 2015-02-17 ENCOUNTER — Ambulatory Visit (INDEPENDENT_AMBULATORY_CARE_PROVIDER_SITE_OTHER)
Admission: RE | Admit: 2015-02-17 | Discharge: 2015-02-17 | Disposition: A | Discharge status: Home / Self Care | Payer: BC Managed Care – PPO | Source: Ambulatory Visit | Attending: Internal Medicine | Admitting: Internal Medicine

## 2015-02-17 ENCOUNTER — Encounter: Payer: Self-pay | Admitting: Internal Medicine

## 2015-02-17 ENCOUNTER — Ambulatory Visit (INDEPENDENT_AMBULATORY_CARE_PROVIDER_SITE_OTHER): Payer: BC Managed Care – PPO | Admitting: Internal Medicine

## 2015-02-17 VITALS — BP 132/82 | HR 88 | Ht 66.0 in | Wt 260.0 lb

## 2015-02-17 DIAGNOSIS — D869 Sarcoidosis, unspecified: Secondary | ICD-10-CM | POA: Diagnosis not present

## 2015-02-17 DIAGNOSIS — I10 Essential (primary) hypertension: Secondary | ICD-10-CM | POA: Diagnosis not present

## 2015-02-17 NOTE — Patient Instructions (Addendum)
There is no evidence of significant enough active systemic or pulmonary sarcoid to warrant putting you back on prednisone (but may be needed for your eyes if topical steroids not effective/ dosing/monitoring would be per Dr Herbert Deaner)   Please remember to go to the   x-ray department downstairs for your tests - we will call you with the results when they are available.  If you have any new changes on your chest xray or unexplained worsening in your breathing then you return here for pfts

## 2015-02-17 NOTE — Progress Notes (Signed)
Subjective:    Patient ID: KAYDIE PETSCH, female    DOB: [birth year],    MRN: [id number]  Brief patient profile:  51 yobf never smoker dx with sarcoid / iritis on and off prednisone mostly for eye issues with wt gain x around 20 lbs > indolent onset of progressive sob x feb 2015 both at rest and also with exertion with some chest tightness and neg cardiac w/u including LHC 09/12/14 with nl LV  so referred to pulmonary clinic 09/30/2014 by Dr Uvaldo Bristle with original dx of sarcoid in 1998 at wt 240  baseline   History of Present Illness  09/30/2014 1st [facility] office visit/ Melissia Lahman  / wt 71  Chief Complaint  Patient presents with  . Pulmonary Consult    Referred by Dr. Renold Genta. Pt states that she was dxed with Sarcoid in 1998. She c/o increased SOB x 6 months. She states that she gets SOB with or without any exertion.    indolent onset gradually worse assoc with overt HB ? Worse x one year but not cough while on acei  Most ex s stopping = malls but slowed by fatgiue  No noct symptoms at all. rec Stop lisinopril and start diovan 160 mg daily in its place Try prilosec 11m  Take 30-60 min before first meal of the day and Pepcid 20 mg one bedtime  GERD diet   02/17/2015  reconcult re ? Sarcoid acitivity  / sob resolved p above ov then worse on timolol eyedrops Chief Complaint  Patient presents with  . Follow-up    Pt states still has SOB. Thinks it is related to eye medication. Denies fever, chest pain, tightness or cough   bad flare white out R eye > improved  Could not tolerate timolol due to sob  Breathing is back to baseline  And tolerating  low dose  Timolol   Last prednisone by mouth was sev years prior to OV    No obvious day to day or daytime variabilty or assoc chronic cough or cp or chest tightness, subjective wheeze overt sinus or hb symptoms. No unusual exp hx or h/o childhood pna/ asthma or knowledge of premature birth.  Sleeping ok without nocturnal  or early am  exacerbation  of respiratory  c/o's or need for noct saba. Also denies any obvious fluctuation of symptoms with weather or environmental changes or other aggravating or alleviating factors except as outlined above   Current Medications, Allergies, Complete Past Medical History, Past Surgical History, Family History, and Social History [facility] [employer] [facility]  ROS  The following are not active complaints unless bolded sore throat, dysphagia, dental problems, itching, sneezing,  nasal congestion or excess/ purulent secretions, ear ache,   fever, chills, sweats, unintended wt loss, pleuritic or exertional cp, hemoptysis,  orthopnea pnd or leg swelling, presyncope, palpitations, heartburn, abdominal pain, anorexia, nausea, vomiting, diarrhea  or change in bowel or urinary habits, change in stools or urine, dysuria,hematuria,  rash, arthralgias, visual complaints have resolved  headache, numbness weakness or ataxia or problems with walking or coordination,  change in mood/affect or memory.           Objective:   Physical Exam  amb bf nad   02/17/2015          260  Wt Readings from Last 3 Encounters:  02/17/15 260 lb (117.935 kg)  09/30/14 260 lb 9.6 oz (118.207 kg)  09/12/14 250 lb (113.399 kg)  Vital signs reviewed   HEENT: nl dentition, turbinates, and orophanx. Nl external ear canals without cough reflex   NECK :  without JVD/Nodes/TM/ nl carotid upstrokes bilaterally   LUNGS: no acc muscle use, clear to A and P bilaterally without cough on insp or exp maneuvers   CV:  RRR  no s3 or murmur or increase in P2, no edema   ABD:  soft and nontender with nl excursion in the supine position. No bruits or organomegaly, bowel sounds nl  MS:  warm without deformities, calf tenderness, cyanosis or clubbing  SKIN: warm and dry without lesions    NEURO:  alert, approp, no deficits    CXR PA and Lateral:   02/17/2015 :     I personally reviewed images  and agree with radiology impression as follows:      Bilateral hilar adenopathy persists         Assessment & Plan:

## 2015-02-18 NOTE — Progress Notes (Signed)
Quick Note:  Spoke with pt and notified of results per Dr. Melvyn Novas. Pt verbalized understanding and denied any questions.  ______

## 2015-02-19 ENCOUNTER — Encounter: Payer: Self-pay | Admitting: Internal Medicine

## 2015-02-19 NOTE — Assessment & Plan Note (Signed)
Trial off acei 09/30/14 due to atypical sob   Adequate control on present rx, reviewed > no change in rx needed  > would avoid acei here due to overlap with pulmonary sarcoid symptoms

## 2015-02-19 NOTE — Assessment & Plan Note (Signed)
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  No indication for lung bx here.   Indication for systemic steroids for occular involvement entirely up to Dr Herbert Deaner  Would like to get some baseline pfts now but no change in pulmonary rx otherwise  See instructions for specific recommendations which were reviewed directly with the patient who was given a copy with highlighter outlining the key components.

## 2015-03-14 ENCOUNTER — Other Ambulatory Visit: Payer: Self-pay

## 2015-03-14 MED ORDER — METOPROLOL SUCCINATE ER 25 MG PO TB24: 25.0000 mg | ORAL_TABLET | Freq: Every day | ORAL | Status: DC

## 2015-06-12 ENCOUNTER — Ambulatory Visit
Admission: RE | Admit: 2015-06-12 | Discharge: 2015-06-12 | Disposition: A | Discharge status: Home / Self Care | Payer: BC Managed Care – PPO | Source: Ambulatory Visit | Attending: Internal Medicine | Admitting: Internal Medicine

## 2015-06-12 ENCOUNTER — Other Ambulatory Visit: Payer: Self-pay | Admitting: Internal Medicine

## 2015-06-12 DIAGNOSIS — R319 Hematuria, unspecified: Secondary | ICD-10-CM

## 2015-06-12 DIAGNOSIS — R109 Unspecified abdominal pain: Secondary | ICD-10-CM

## 2015-06-24 ENCOUNTER — Emergency Department (HOSPITAL_COMMUNITY)
Admission: EM | Admit: 2015-06-24 | Discharge: 2015-06-24 | Discharge status: Against Medical Advice | Payer: BC Managed Care – PPO

## 2015-06-24 ENCOUNTER — Encounter (HOSPITAL_COMMUNITY): Payer: Self-pay

## 2015-06-24 ENCOUNTER — Emergency Department (HOSPITAL_COMMUNITY)
Admission: EM | Admit: 2015-06-24 | Discharge: 2015-06-24 | Disposition: A | Discharge status: Home / Self Care | Payer: BC Managed Care – PPO | Attending: Emergency Medicine | Admitting: Emergency Medicine

## 2015-06-24 ENCOUNTER — Emergency Department (HOSPITAL_COMMUNITY): Payer: BC Managed Care – PPO

## 2015-06-24 DIAGNOSIS — N23 Unspecified renal colic: Secondary | ICD-10-CM | POA: Diagnosis not present

## 2015-06-24 DIAGNOSIS — S3991XA Unspecified injury of abdomen, initial encounter: Secondary | ICD-10-CM | POA: Insufficient documentation

## 2015-06-24 DIAGNOSIS — W01190A Fall on same level from slipping, tripping and stumbling with subsequent striking against furniture, initial encounter: Secondary | ICD-10-CM | POA: Diagnosis not present

## 2015-06-24 DIAGNOSIS — Z79899 Other long term (current) drug therapy: Secondary | ICD-10-CM | POA: Insufficient documentation

## 2015-06-24 DIAGNOSIS — Y998 Other external cause status: Secondary | ICD-10-CM | POA: Insufficient documentation

## 2015-06-24 DIAGNOSIS — Z7982 Long term (current) use of aspirin: Secondary | ICD-10-CM | POA: Diagnosis not present

## 2015-06-24 DIAGNOSIS — I10 Essential (primary) hypertension: Secondary | ICD-10-CM | POA: Diagnosis not present

## 2015-06-24 DIAGNOSIS — Y9389 Activity, other specified: Secondary | ICD-10-CM | POA: Diagnosis not present

## 2015-06-24 DIAGNOSIS — M1990 Unspecified osteoarthritis, unspecified site: Secondary | ICD-10-CM | POA: Diagnosis not present

## 2015-06-24 DIAGNOSIS — N201 Calculus of ureter: Secondary | ICD-10-CM | POA: Diagnosis not present

## 2015-06-24 DIAGNOSIS — Z862 Personal history of diseases of the blood and blood-forming organs and certain disorders involving the immune mechanism: Secondary | ICD-10-CM | POA: Diagnosis not present

## 2015-06-24 DIAGNOSIS — Z9889 Other specified postprocedural states: Secondary | ICD-10-CM | POA: Insufficient documentation

## 2015-06-24 DIAGNOSIS — Y9289 Other specified places as the place of occurrence of the external cause: Secondary | ICD-10-CM | POA: Diagnosis not present

## 2015-06-24 DIAGNOSIS — Z90710 Acquired absence of both cervix and uterus: Secondary | ICD-10-CM | POA: Insufficient documentation

## 2015-06-24 DIAGNOSIS — H409 Unspecified glaucoma: Secondary | ICD-10-CM | POA: Insufficient documentation

## 2015-06-24 DIAGNOSIS — Z9049 Acquired absence of other specified parts of digestive tract: Secondary | ICD-10-CM | POA: Diagnosis not present

## 2015-06-24 LAB — URINALYSIS, ROUTINE W REFLEX MICROSCOPIC
BILIRUBIN URINE: NEGATIVE
Glucose, UA: NEGATIVE mg/dL
HGB URINE DIPSTICK: NEGATIVE
Ketones, ur: NEGATIVE mg/dL
Leukocytes, UA: NEGATIVE
Nitrite: NEGATIVE
PH: 8 (ref 5.0–8.0)
Protein, ur: NEGATIVE mg/dL
Specific Gravity, Urine: 1.016 (ref 1.005–1.030)
UROBILINOGEN UA: 0.2 mg/dL (ref 0.0–1.0)

## 2015-06-24 MED ORDER — TAMSULOSIN HCL 0.4 MG PO CAPS: ORAL_CAPSULE | ORAL | Status: DC

## 2015-06-24 MED ORDER — ONDANSETRON HCL 4 MG/2ML IJ SOLN
4.0000 mg | Freq: Once | INTRAMUSCULAR | Status: AC
  Administered 2015-06-24: 4 mg via INTRAVENOUS
  Filled 2015-06-24: qty 2

## 2015-06-24 MED ORDER — HYDROMORPHONE HCL 1 MG/ML IJ SOLN
1.0000 mg | Freq: Once | INTRAMUSCULAR | Status: AC
  Administered 2015-06-24: 1 mg via INTRAVENOUS
  Filled 2015-06-24: qty 1

## 2015-06-24 MED ORDER — TAMSULOSIN HCL 0.4 MG PO CAPS
0.4000 mg | ORAL_CAPSULE | Freq: Once | ORAL | Status: AC
  Administered 2015-06-24: 0.4 mg via ORAL
  Filled 2015-06-24: qty 1

## 2015-06-24 MED ORDER — KETOROLAC TROMETHAMINE 30 MG/ML IJ SOLN
30.0000 mg | Freq: Once | INTRAMUSCULAR | Status: AC
Start: 2015-06-24 — End: 2015-06-24
  Administered 2015-06-24: 30 mg via INTRAVENOUS
  Filled 2015-06-24: qty 1

## 2015-06-24 NOTE — Progress Notes (Signed)
CSW met with patient at bedside. Daughter was present.   Patient confirms that she presents to [facility] due to fall. Daughter states that patient fell while outside their storage house. Daughter stated " She slipped down the ramp" around 11:30 this morning.  Patient informed CSW that she completes her ADL's independently. Patient states that she does not fall often.  Patient informed CSW that she has a great support system. Patient states that she lives at home with her husband. Also, she states that she is not interested in home health of a facility at this time.   Patient and daughter state that they do not have any questions at this time.  Willette Brace [phone] ED CSW 06/24/2015

## 2015-06-24 NOTE — Discharge Instructions (Signed)
Strain your urine to catch the stone.   Kidney Stones Kidney stones (urolithiasis) are deposits that form inside your kidneys. The intense pain is caused by the stone moving through the urinary tract. When the stone moves, the ureter goes into spasm around the stone. The stone is usually passed in the urine.  CAUSES   A disorder that makes certain neck glands produce too much parathyroid hormone (primary hyperparathyroidism).  A buildup of uric acid crystals, similar to gout in your joints.  Narrowing (stricture) of the ureter.  A kidney obstruction present at birth (congenital obstruction).  Previous surgery on the kidney or ureters.  Numerous kidney infections. SYMPTOMS   Feeling sick to your stomach (nauseous).  Throwing up (vomiting).  Blood in the urine (hematuria).  Pain that usually spreads (radiates) to the groin.  Frequency or urgency of urination. DIAGNOSIS   Taking a history and physical exam.  Blood or urine tests.  CT scan.  Occasionally, an examination of the inside of the urinary bladder (cystoscopy) is performed. TREATMENT   Observation.  Increasing your fluid intake.  Extracorporeal shock wave lithotripsy--This is a noninvasive procedure that uses shock waves to break up kidney stones.  Surgery may be needed if you have severe pain or persistent obstruction. There are various surgical procedures. Most of the procedures are performed with the use of small instruments. Only small incisions are needed to accommodate these instruments, so recovery time is minimized. The size, location, and chemical composition are all important variables that will determine the proper choice of action for you. Talk to your health care provider to better understand your situation so that you will minimize the risk of injury to yourself and your kidney.  HOME CARE INSTRUCTIONS   Drink enough water and fluids to keep your urine clear or pale yellow. This will help you to  pass the stone or stone fragments.  Strain all urine through the provided strainer. Keep all particulate matter and stones for your health care provider to see. The stone causing the pain may be as small as a grain of salt. It is very important to use the strainer each and every time you pass your urine. The collection of your stone will allow your health care provider to analyze it and verify that a stone has actually passed. The stone analysis will often identify what you can do to reduce the incidence of recurrences.  Only take over-the-counter or prescription medicines for pain, discomfort, or fever as directed by your health care provider.  Make a follow-up appointment with your health care provider as directed.  Get follow-up X-rays if required. The absence of pain does not always mean that the stone has passed. It may have only stopped moving. If the urine remains completely obstructed, it can cause loss of kidney function or even complete destruction of the kidney. It is your responsibility to make sure X-rays and follow-ups are completed. Ultrasounds of the kidney can show blockages and the status of the kidney. Ultrasounds are not associated with any radiation and can be performed easily in a matter of minutes. SEEK MEDICAL CARE IF:  You experience pain that is progressive and unresponsive to any pain medicine you have been prescribed. SEEK IMMEDIATE MEDICAL CARE IF:   Pain cannot be controlled with the prescribed medicine.  You have a fever or shaking chills.  The severity or intensity of pain increases over 18 hours and is not relieved by pain medicine.  You develop a new onset of  abdominal pain.  You feel faint or pass out.  You are unable to urinate. MAKE SURE YOU:   Understand these instructions.  Will watch your condition.  Will get help right away if you are not doing well or get worse. Document Released: 11/01/2005 Document Revised: 07/04/2013 Document Reviewed:  04/04/2013 [facility] Patient Information 2015 [place], [place]. This information is not intended to replace advice given to you by your health care provider. Make sure you discuss any questions you have with your health care provider.  Urine Strainer This strainer is used to catch or filter out any stones found in your urine. Place the strainer under your urine stream. Save any stones or objects that you find in your urine. Place them in a plastic or glass container to show your caregiver. The stones vary in size - some can be very small, so make sure you check the strainer carefully. Your caregiver may send the stone to the lab. When the results are back, your caregiver may recommend medicines or diet changes.  Document Released: 08/06/2004 Document Revised: 01/24/2012 Document Reviewed: 09/13/2008 [facility] Patient Information 2015 [employer], [place]. This information is not intended to replace advice given to you by your health care provider. Make sure you discuss any questions you have with your health care provider.  Ureteral Colic (Kidney Stones) Ureteral colic is the result of a condition when kidney stones form inside the kidney. Once kidney stones are formed they may move into the tube that connects the kidney with the bladder (ureter). If this occurs, this condition may cause pain (colic) in the ureter.  CAUSES  Pain is caused by stone movement in the ureter and the obstruction caused by the stone. SYMPTOMS  The pain comes and goes as the ureter contracts around the stone. The pain is usually intense, sharp, and stabbing in character. The location of the pain may move as the stone moves through the ureter. When the stone is near the kidney the pain is usually located in the back and radiates to the belly (abdomen). When the stone is ready to pass into the bladder the pain is often located in the lower abdomen on the side the stone is located. At this location, the symptoms may mimic those of a  urinary tract infection with urinary frequency. Once the stone is located here it often passes into the bladder and the pain disappears completely. TREATMENT   Your caregiver will provide you with medicine for pain relief.  You may require specialized follow-up X-rays.  The absence of pain does not always mean that the stone has passed. It may have just stopped moving. If the urine remains completely obstructed, it can cause loss of kidney function or even complete destruction of the involved kidney. It is your responsibility and in your interest that X-rays and follow-ups as suggested by your caregiver are completed. Relief of pain without passage of the stone can be associated with severe damage to the kidney, including loss of kidney function on that side.  If your stone does not pass on its own, additional measures may be taken by your caregiver to ensure its removal. HOME CARE INSTRUCTIONS   Increase your fluid intake. Water is the preferred fluid since juices containing vitamin C may acidify the urine making it less likely for certain stones (uric acid stones) to pass.  Strain all urine. A strainer will be provided. Keep all particulate matter or stones for your caregiver to inspect.  Take your pain medicine as directed.  Make a follow-up appointment with your caregiver as directed.  Remember that the goal is passage of your stone. The absence of pain does not mean the stone is gone. Follow your caregiver's instructions.  Only take over-the-counter or prescription medicines for pain, discomfort, or fever as directed by your caregiver. SEEK MEDICAL CARE IF:   Pain cannot be controlled with the prescribed medicine.  You have a fever.  Pain continues for longer than your caregiver advises it should.  There is a change in the pain, and you develop chest discomfort or constant abdominal pain.  You feel faint or pass out. MAKE SURE YOU:   Understand these instructions.  Will  watch your condition.  Will get help right away if you are not doing well or get worse. Document Released: 08/11/2005 Document Revised: 02/26/2013 Document Reviewed: 04/28/2011 [facility] Patient Information 2015 [place], [place]. This information is not intended to replace advice given to you by your health care provider. Make sure you discuss any questions you have with your health care provider.

## 2015-06-24 NOTE — ED Notes (Signed)
Pt c/o R "side" pain and nausea after a slip and fall this afternoon.  Pain score 10/10.  Denies loc, hitting head, and headache.  Pt reports "it feels like a lot of pressure."

## 2015-06-24 NOTE — ED Provider Notes (Signed)
CSN: [id number]     Arrival date & time 06/24/15  1526 History   First MD Initiated Contact with Patient 06/24/15 1817     Chief Complaint  Patient presents with  . Fall  . Abdominal Pain  . Nausea     (Consider location/radiation/quality/duration/timing/severity/associated sxs/prior Treatment) HPI   Kristine [name] is a 64 y.o. female who presents for evaluation of right flank pain, which occurred after a fall against a piece of furniture. She slipped and fell. She did not lose consciousness. She has had similar pain in the past when she was passing a kidney stone. Last week she had hematuria and was evaluated for left low back pain, with a renal ultrasound which she reports is negative. Her hematuria has since resolved. She denies fever, chills, cough, shortness of breath, weakness or dizziness. There are no other known modifying factors.   Past Medical History  Diagnosis Date  . Hypertension   . Sarcoidosis   . Glaucoma     ROD  . Allergy   . Arthritis    Past Surgical History  Procedure Laterality Date  . Cholecystectomy    . Abdominal hysterectomy    . Knee surgery      right  . Bunionectomy      left  . Left heart catheterization with coronary angiogram N/A 09/12/2014    Procedure: LEFT HEART CATHETERIZATION WITH CORONARY ANGIOGRAM;  Surgeon: Troy Sine, MD;  Location: [facility] CATH LAB;  Service: Cardiovascular;  Laterality: N/A;   Family History  Problem Relation Age of Onset  . Breast cancer Mother   . Lung cancer Mother   . Lung cancer Father     smoked  . Heart disease Maternal Grandmother    History  Substance Use Topics  . Smoking status: Never Smoker   . Smokeless tobacco: Never Used  . Alcohol Use: Yes     Comment: occ   OB History    Gravida Para Term Preterm AB TAB SAB Ectopic Multiple Living   _0 Review of Systems  All other systems reviewed and are negative.     Allergies  Review of patient's allergies indicates no known  allergies.  Home Medications   Prior to Admission medications   Medication Sig Start Date End Date Taking? Authorizing Provider  aspirin EC 81 MG tablet Take 1 tablet (81 mg total) by mouth daily. 09/10/14  Yes Thayer Headings, MD  Brinzolamide-Brimonidine [facility]) 1-0.2 % SUSP Place 1 drop into both eyes daily.    Yes Historical Provider, MD  Cholecalciferol (VITAMIN D3) 5000 UNITS CAPS Take 5,000 Units by mouth daily.   Yes Historical Provider, MD  DULoxetine (CYMBALTA) 30 MG capsule Take 30 mg by mouth every evening. 05/29/15  Yes Historical Provider, MD  furosemide (LASIX) 20 MG tablet Take 20 mg by mouth daily as needed for fluid.    Yes Historical Provider, MD  metoprolol succinate (TOPROL XL) 25 MG 24 hr tablet Take 1 tablet (25 mg total) by mouth daily. 03/14/15  Yes Thayer Headings, MD  prednisoLONE acetate (PRED FORTE) 1 % ophthalmic suspension Place 1 drop into both eyes daily.   Yes Historical Provider, MD  timolol (BETIMOL) 0.5 % ophthalmic solution Place 1 drop into both eyes daily.   Yes Historical Provider, MD  valsartan (DIOVAN) 160 MG tablet Take 1 tablet (160 mg total) by mouth daily. 09/30/14  Yes [name], MD  VICODIN 5-300 MG TABS Take 1 tablet by mouth 2 (two) times daily as needed. pain 06/14/15   Historical Provider, MD   BP 165/85 mmHg  Pulse 73  Temp(Src) 98.1 F (36.7 C) (Oral)  Resp 18  SpO2 97% Physical Exam  Constitutional: She is oriented to person, place, and time. She appears well-developed and well-nourished. She appears distressed (She states that she is too uncomfortable, to lie down. She is able to stand and walk.).  Obese  HENT:  Head: Normocephalic and atraumatic.  Right Ear: External ear normal.  Left Ear: External ear normal.  Eyes: Conjunctivae and EOM are normal. Pupils are equal, round, and reactive to light.  Neck: Normal range of motion and phonation normal. Neck supple.  Cardiovascular: Normal rate, regular rhythm and normal heart  sounds.   Pulmonary/Chest: Effort normal and breath sounds normal. No respiratory distress. She has no wheezes. She exhibits no bony tenderness.  Abdominal: Soft. There is no tenderness.  Musculoskeletal: Normal range of motion.  No tenderness of the flank. There is mild tenderness over the right posterior iliac spine.  Neurological: She is alert and oriented to person, place, and time. No cranial nerve deficit or sensory deficit. She exhibits normal muscle tone. Coordination normal.  Skin: Skin is warm, dry and intact.  Psychiatric: She has a normal mood and affect. Her behavior is normal. Judgment and thought content normal.  Nursing note and vitals reviewed.   ED Course  Procedures (including critical care time)  Medications  ketorolac (TORADOL) 30 MG/ML injection 30 mg (not administered)  tamsulosin (FLOMAX) capsule 0.4 mg (not administered)  HYDROmorphone (DILAUDID) injection 1 mg (1 mg Intravenous Given 06/24/15 1914)  ondansetron (ZOFRAN) injection 4 mg (4 mg Intravenous Given 06/24/15 1914)    Patient Vitals for the past 24 hrs:  BP Temp Temp src Pulse Resp SpO2  06/24/15 1920 165/85 mmHg - - 73 18 97 %  06/24/15 1848 192/98 mmHg - - 73 16 96 %  06/24/15 1530 160/70 mmHg 98.1 F (36.7 C) Oral 68 20 99 %   Radiologic imaging report reviewed and images by CT  - viewed, by me. No boney pelvis abnormality   9:13 PM Reevaluation with update and discussion. After initial assessment and treatment, an updated evaluation reveals pain and a better but is now 6/10. Findings discussed with the patient. Additional medication, Toradol, Flomax ordered. She states that she has a prescription for Vicodin at home at this time. Cherly Erno L    Labs Review Labs Reviewed  URINALYSIS, ROUTINE W REFLEX MICROSCOPIC (NOT AT [facility])    Imaging Review Ct Renal Stone Study  06/24/2015   CLINICAL DATA:  Right flank pain and nausea.  Fall earlier today  EXAM: CT ABDOMEN AND PELVIS WITHOUT CONTRAST   TECHNIQUE: Multidetector CT imaging of the abdomen and pelvis was performed following the standard protocol without oral or intravenous contrast material administration.  COMPARISON:  September 28, 2011  FINDINGS: There is slight left base atelectasis posteriorly. On the initial CT slice, there is opacity in the right hilar region that is incompletely visualized. This area is difficult to assess but possibly could represent a nodular lesion in the right perihilar region. There is calcification in the region of the mitral valve.  No focal liver lesions are identified on this noncontrast enhanced study. Gallbladder is absent. There is no appreciable biliary duct dilatation.  Spleen, pancreas, and adrenals appear normal.  Left kidney shows no evidence of mass or hydronephrosis. There is no  left-sided renal or ureteral calculus. On the right, the kidney is edematous. There is no right renal mass. There is moderately severe hydronephrosis on the right. There is no intrarenal calculus on the right. There is edema along the course of the proximal right ureter. There is a 3 mm calculus at the right ureterovesical junction. No other ureteral calculi are identified.  In the pelvis, the urinary bladder is midline with normal wall thickness. Uterus is absent. There is no pelvic mass or pelvic fluid collection. Appendix appears normal.  There is no bowel obstruction. No free air or portal venous air. There is no appreciable ascites, adenopathy, or abscess in the abdomen or pelvis. There is atherosclerotic change at the level of the aortic bifurcation. There is no abdominal aortic aneurysm. There are no blastic or lytic bone lesions. There is degenerative change in the lumbar spine.  IMPRESSION: 3 mm calculus right ureterovesical junction with moderately severe hydronephrosis on the right. There is right renal and proximal right ureteral edema.  Uterus and gallbladder absent.  No bowel obstruction.  No abscess.  Appendix appears  normal.  Calcification in the mitral valve region.  On the initial CT slice, there is opacity in the right hilar region is incompletely visualized. This opacity appear somewhat nodular. Given this appearance, chest radiograph is advised with particular attention to the right hilum/perihilar region.   Electronically Signed   By: Lowella Grip III M.D.   On: 06/24/2015 19:57     EKG Interpretation   Date/Time:  Tuesday June 24 2015 15:38:58 EDT Ventricular Rate:  66 PR Interval:  194 QRS Duration: 124 QT Interval:  412 QTC Calculation: 432 R Axis:   -41 Text Interpretation:  Sinus rhythm Left bundle branch block Since last  tracing Left bundle branch block is new Confirmed by Eulis Foster  MD, Vira Agar  ([id number]) on 06/24/2015 6:25:07 PM      MDM   Final diagnoses:  Ureteral stone  Ureteral colic    Inconsistent with ureteral colic from small stone distal, that should pass within 72 hours. No UTI, sepsis, metabolic instability.  Nursing Notes Reviewed/ Care Coordinated Applicable Imaging Reviewed Interpretation of Laboratory Data incorporated into ED treatment  The patient appears reasonably screened and/or stabilized for discharge and I doubt any other medical condition or other [facility] requiring further screening, evaluation, or treatment in the ED at this time prior to discharge.  Plan: Home Medications- Flomax; Home Treatments- strain urine; return here if the recommended treatment, does not improve the symptoms; Recommended follow up- Urology 3-7 days    Daleen Bo, MD 06/24/15 2239

## 2015-06-24 NOTE — ED Notes (Signed)
EKG ordered, due to Pt reporting to [place] NT that she was dizzy.  Pt denied to this RN.

## 2015-10-07 ENCOUNTER — Other Ambulatory Visit: Payer: Self-pay | Admitting: Internal Medicine

## 2015-10-10 ENCOUNTER — Other Ambulatory Visit: Payer: Self-pay | Admitting: Internal Medicine

## 2015-10-11 ENCOUNTER — Other Ambulatory Visit: Payer: Self-pay | Admitting: Internal Medicine

## 2016-07-02 ENCOUNTER — Ambulatory Visit (INDEPENDENT_AMBULATORY_CARE_PROVIDER_SITE_OTHER)
Admission: RE | Admit: 2016-07-02 | Discharge: 2016-07-02 | Disposition: A | Discharge status: Home / Self Care | Payer: BC Managed Care – PPO | Source: Ambulatory Visit | Attending: Internal Medicine | Admitting: Internal Medicine

## 2016-07-02 ENCOUNTER — Encounter: Payer: Self-pay | Admitting: Internal Medicine

## 2016-07-02 ENCOUNTER — Ambulatory Visit (INDEPENDENT_AMBULATORY_CARE_PROVIDER_SITE_OTHER): Payer: BC Managed Care – PPO | Admitting: Internal Medicine

## 2016-07-02 ENCOUNTER — Encounter: Payer: Self-pay | Admitting: Podiatry

## 2016-07-02 ENCOUNTER — Ambulatory Visit (INDEPENDENT_AMBULATORY_CARE_PROVIDER_SITE_OTHER): Payer: BC Managed Care – PPO

## 2016-07-02 ENCOUNTER — Ambulatory Visit (INDEPENDENT_AMBULATORY_CARE_PROVIDER_SITE_OTHER): Payer: BC Managed Care – PPO | Admitting: Podiatry

## 2016-07-02 VITALS — BP 143/72 | HR 89 | Resp 16 | Ht 66.0 in | Wt 250.0 lb

## 2016-07-02 VITALS — BP 140/82 | HR 91 | Ht 66.0 in | Wt 259.0 lb

## 2016-07-02 DIAGNOSIS — M779 Enthesopathy, unspecified: Secondary | ICD-10-CM

## 2016-07-02 DIAGNOSIS — M79672 Pain in left foot: Secondary | ICD-10-CM | POA: Diagnosis not present

## 2016-07-02 DIAGNOSIS — M25571 Pain in right ankle and joints of right foot: Secondary | ICD-10-CM

## 2016-07-02 DIAGNOSIS — M21619 Bunion of unspecified foot: Secondary | ICD-10-CM

## 2016-07-02 DIAGNOSIS — K219 Gastro-esophageal reflux disease without esophagitis: Secondary | ICD-10-CM | POA: Diagnosis not present

## 2016-07-02 DIAGNOSIS — R0600 Dyspnea, unspecified: Secondary | ICD-10-CM

## 2016-07-02 DIAGNOSIS — D869 Sarcoidosis, unspecified: Secondary | ICD-10-CM | POA: Diagnosis not present

## 2016-07-02 MED ORDER — TRIAMCINOLONE ACETONIDE 10 MG/ML IJ SUSP
10.0000 mg | Freq: Once | INTRAMUSCULAR | Status: AC
  Administered 2016-07-02: 10 mg

## 2016-07-02 MED ORDER — PANTOPRAZOLE SODIUM 40 MG PO TBEC: 40.0000 mg | DELAYED_RELEASE_TABLET | Freq: Every day | ORAL | 2 refills | Status: DC

## 2016-07-02 MED ORDER — FAMOTIDINE 20 MG PO TABS: ORAL_TABLET | ORAL | 2 refills | Status: DC

## 2016-07-02 NOTE — Progress Notes (Signed)
Subjective:    Patient ID: Kristine [name], female    DOB: [birth year],    MRN: [id number]  Brief patient profile:  65 yobf never smoker dx with sarcoid / iritis on and off prednisone mostly for eye issues with wt gain x around 20 lbs > indolent onset of progressive sob x feb 2015 both at rest and also with exertion with some chest tightness and neg cardiac w/u including LHC 09/12/14 with nl LV  so referred to pulmonary clinic 09/30/2014 by Dr Uvaldo Bristle with original dx of sarcoid in 1998 at wt 240  baseline   History of Present Illness  09/30/2014 1st [facility] office visit/ Kristine [name]  / wt 78  Chief Complaint  Patient presents with  . Pulmonary Consult    Referred by Dr. Renold Genta. Pt states that she was dxed with Sarcoid in 1998. She c/o increased SOB x 6 months. She states that she gets SOB with or without any exertion.    indolent onset gradually worse assoc with overt HB ? Worse x one year but not cough while on acei  Most ex s stopping = malls but slowed by fatgiue  No noct symptoms at all. rec Stop lisinopril and start diovan 160 mg daily in its place Try prilosec 25m  Take 30-60 min before first meal of the day and Pepcid 20 mg one bedtime  GERD diet   02/17/2015  reconcult re ? Sarcoid acitivity  / sob resolved p above ov then worse on timolol eyedrops Chief Complaint  Patient presents with  . Follow-up    Pt states still has SOB. Thinks it is related to eye medication. Denies fever, chest pain, tightness or cough   bad flare white out R eye > improved  Could not tolerate timolol due to sob  Breathing is back to baseline  And tolerating  low dose  Timolol   Last prednisone by mouth was sev years prior to OV   rec There is no evidence of significant enough active systemic or pulmonary sarcoid to warrant putting you back on prednisone (but may be needed for your eyes if topical steroids not effective/ dosing/monitoring would be per Dr HHerbert Deaner  If you have any new changes  on your chest xray or unexplained worsening in your breathing then you return here for pfts     07/02/2016  Re-establish extended ov/Kristine [name] re: sarocid with worse sob  Chief Complaint  Patient presents with  . Follow-up    pt c/o worsening sob with exertion    gradual onset doe x one month doe assoc with minimal cough / dry  Esp outside in heat  Sleeping ok / some increased dyspepsia /[facility]= can't walk a nl pace on a flat grade s sob but does fine slow and flat eg shopping leaning on cart     No obvious day to day or daytime variabilty or assoc chronic cough or cp or chest tightness, subjective wheeze overt sinus  symptoms. No unusual exp hx or h/o childhood pna/ asthma or knowledge of premature birth.  Sleeping ok without nocturnal  or early am exacerbation  of respiratory  c/o's or need for noct saba. Also denies any obvious fluctuation of symptoms with weather or environmental changes or other aggravating or alleviating factors except as outlined above   Current Medications, Allergies, Complete Past Medical History, Past Surgical History, Family History, and Social History were reviewed in [employer].  ROS  The following are  not active complaints unless bolded sore throat, dysphagia, dental problems, itching, sneezing,  nasal congestion or excess/ purulent secretions, ear ache,   fever, chills, sweats, unintended wt loss, pleuritic or exertional cp, hemoptysis,  orthopnea pnd or leg swelling, presyncope, palpitations, heartburn, abdominal pain, anorexia, nausea, vomiting, diarrhea  or change in bowel or urinary habits, change in stools or urine, dysuria,hematuria,  rash, arthralgias, visual complaints have resolved  headache, numbness weakness or ataxia or problems with walking or coordination,  change in mood/affect or memory.           Objective:   Physical Exam  amb bf nad   02/17/2015          260 > 07/02/2016   07/02/2016  259      02/17/15 260 lb  (117.935 kg)  09/30/14 260 lb 9.6 oz (118.207 kg)  09/12/14 250 lb (113.399 kg)    Vital signs reviewed   HEENT: nl dentition, turbinates, and orophanx. Nl external ear canals without cough reflex   NECK :  without JVD/Nodes/TM/ nl carotid upstrokes bilaterally   LUNGS: no acc muscle use, clear to A and P bilaterally without cough on insp or exp maneuvers   CV:  RRR  no s3 or murmur or increase in P2, no edema   ABD:  soft and nontender with nl excursion in the supine position. No bruits or organomegaly, bowel sounds nl  MS:  warm without deformities, calf tenderness, cyanosis or clubbing - R ankle splint   SKIN: warm and dry without lesions    NEURO:  alert, approp, no deficits     CXR PA and Lateral:   07/02/2016 :    I personally reviewed images and agree with radiology impression as follows:     Persistent hilar adenopathy.  No acute abnormalities.       Assessment & Plan:

## 2016-07-02 NOTE — Progress Notes (Signed)
   Subjective:    Patient ID: Kristine [name], female    DOB: 06-22-1951, 65 y.o.   MRN: [id number]  HPI Chief Complaint  Patient presents with  . Ankle Pain    Right foot-medial side; x1 month  . Foot Pain    Bilateral; bunions; pt stated, "had bunion removed on left foot over 10 yrs ago"      Review of Systems  Respiratory: Positive for shortness of breath.   All other systems reviewed and are negative.      Objective:   Physical Exam        Assessment & Plan:

## 2016-07-02 NOTE — Patient Instructions (Addendum)
Please remember to go to the  x-ray department downstairs for your tests - we will call you with the results when they are available.  Pantoprazole (protonix) 40 mg   Take  30-60 min before first meal of the day and Pepcid (famotidine)  20 mg one @  bedtime until return to office - this is the best way to tell whether stomach acid is contributing to your problem.    GERD (REFLUX)  is an extremely common cause of respiratory symptoms just like yours , many times with no obvious heartburn at all.    It can be treated with medication, but also with lifestyle changes including elevation of the head of your bed (ideally with 6 inch  bed blocks),  Smoking cessation, avoidance of late meals, excessive alcohol, and avoid fatty foods, chocolate, peppermint, colas, red wine, and acidic juices such as orange juice.  NO MINT OR MENTHOL PRODUCTS SO NO COUGH DROPS   USE SUGARLESS CANDY INSTEAD (Jolley ranchers or Stover's or Life Savers) or even ice chips will also do - the key is to swallow to prevent all throat clearing. NO OIL BASED VITAMINS - use powdered substitutes.  Please schedule a follow up office visit in 4 weeks, sooner if needed with pfts same day at [facility] long hospital

## 2016-07-04 NOTE — Assessment & Plan Note (Signed)
01/11/14 pfts wnl 09/30/2014  Walked RA x 3 laps @ 185 ft each stopped due to  End of study, nl pace, no desat  - 07/02/2016  Walked RA x 2 laps @ 185 ft each stopped due to ankle pain,  with sats 92% at slow to nl pace  Symptoms are disproportionate to objective findings and not clear this is a lung problem but pt does appear to have difficult airway management issues.  DDX of  difficult airways management almost all start with A and  include Adherence, Ace Inhibitors, Acid Reflux, Active Sinus Disease, Alpha 1 Antitripsin deficiency, Anxiety masquerading as Airways dz,  ABPA,  Allergy(esp in young), Aspiration (esp in elderly), Adverse effects of meds,  Active smokers, A bunch of PE's (a small clot burden can't cause this syndrome unless there is already severe underlying pulm or vascular dz with poor reserve) plus two Bs  = Bronchiectasis and Beta blocker use..and one C= CHF   Adherence is always the initial "prime suspect" and is a multilayered concern that requires a "trust but verify" approach in every patient - starting with knowing how to use medications, especially inhalers, correctly, keeping up with refills and understanding the fundamental difference between maintenance and prns vs those medications only taken for a very short course and then stopped and not refilled.   ? Acid (or non-acid) GERD > always difficult to exclude as up to 75% of pts in some series report no assoc GI/ Heartburn symptoms> rec max (24h)  acid suppression and diet restrictions/ reviewed and instructions given in writing.   ? ACEi > on diovan now. My experience has been that many pts with acei intol also have upper airway sensivity to extra es GERD which supports trial of gerd rx before further w/u at this point  ? Anxiety/deconditioning/ obesity >  usually at the bottom of this list of usual suspects but should be much higher on this pt's based on H and P and note already on psychotropics   Will re-eval in 4 weeks  with full pfts  I had an extended discussion with the patient reviewing all relevant studies completed to date and  lasting 25 minutes of a 40  minute extended office visit to re-establish and eval for new progressive sob  Each maintenance medication was reviewed in detail including most importantly the difference between maintenance and prns and under what circumstances the prns are to be triggered using an action plan format that is not reflected in the computer generated alphabetically organized AVS.    Please see instructions for details which were reviewed in writing and the patient given a copy highlighting the part that I personally wrote and discussed at today's ov.  Marland Kitchen

## 2016-07-04 NOTE — Assessment & Plan Note (Signed)
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

## 2016-07-04 NOTE — Assessment & Plan Note (Signed)
Try max rx for gerd next > see avs

## 2016-07-05 NOTE — Progress Notes (Signed)
LMTCB

## 2016-07-05 NOTE — Progress Notes (Signed)
Subjective:     Patient ID: Kristine [name], female   DOB: 19-May-1951, 65 y.o.   MRN: [id number]  HPI patient presents stating this bunion on my right foot has started to really bother me and I've also had shortness of breath and I'm seen a pulmonologist for an I would like to get this corrected. My one on the left foot that was corrected is doing well   Review of Systems  All other systems reviewed and are negative.      Objective:   Physical Exam  Constitutional: She is oriented to person, place, and time.  Cardiovascular: Intact distal pulses.   Musculoskeletal: Normal range of motion.  Neurological: She is oriented to person, place, and time.  Skin: Skin is warm.  Nursing note and vitals reviewed.  neurovascular status intact muscle strength adequate with patient noted to have hyperostosis medial aspect first metatarsal head right with inflammation around it and redness when palpated. It is localized in nature with no proximal edema or no proximal redness noted. 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 Patient wants to have this done and understands risk but is can hold off until after she can meet with a pulmonologist and get the all clear area patient at this time we'll use wider shoes soft leather as best as possible  X-ray report indicates elevation of the intermetatarsal angle right with some spurring noted around the first metatarsal head and well corrected left first metatarsal

## 2016-07-06 ENCOUNTER — Telehealth: Payer: Self-pay | Admitting: Internal Medicine

## 2016-07-06 NOTE — Telephone Encounter (Signed)
[name], MD  Rosana Berger, CMA        Call pt: Reviewed cxr and no acute change so no change in recommendations made at ov    I spoke with patient about results and she verbalized understanding and had no questions

## 2016-07-16 ENCOUNTER — Ambulatory Visit (INDEPENDENT_AMBULATORY_CARE_PROVIDER_SITE_OTHER): Payer: BC Managed Care – PPO | Admitting: Podiatry

## 2016-07-16 ENCOUNTER — Encounter: Payer: Self-pay | Admitting: Podiatry

## 2016-07-16 DIAGNOSIS — M2040 Other hammer toe(s) (acquired), unspecified foot: Secondary | ICD-10-CM

## 2016-07-16 DIAGNOSIS — M21619 Bunion of unspecified foot: Secondary | ICD-10-CM | POA: Diagnosis not present

## 2016-07-16 NOTE — Progress Notes (Addendum)
Subjective:     Patient ID: Kristine [name], female   DOB: 12/18/1950, 65 y.o.   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 I allowed patient read consent form and explained all risk including alternative treatment, occasions. She wants surgery signed consent form and is given preoperative instructions. I did review with her great length her sarcoidosis which she seems to have no issues with but I did discuss with her could affect healing and she is completely aware of this and wants the surgery and I did dispense air fracture walker with all instructions on usage today. We will need release from her pulmonologist and she is  to instructed to call us with any questions she may have

## 2016-07-16 NOTE — Patient Instructions (Signed)
Pre-Operative Instructions  Congratulations, you have decided to take an important step to improving your quality of life.  You can be assured that the doctors of [facility] will be with you every step of the way.  1. Plan to be at the surgery center/hospital at least 1 (one) hour prior to your scheduled time unless otherwise directed by the surgical center/hospital staff.  You must have a responsible adult accompany you, remain during the surgery and drive you home.  Make sure you have directions to the surgical center/hospital and know how to get there on time. 2. For hospital based surgery you will need to obtain a history and physical form from your family physician within 1 month prior to the date of surgery- we will give you a form for you primary physician.  3. We make every effort to accommodate the date you request for surgery.  There are however, times where surgery dates or times have to be moved.  We will contact you as soon as possible if a change in schedule is required.   4. No Aspirin/Ibuprofen for one week before surgery.  If you are on aspirin, any non-steroidal anti-inflammatory medications (Mobic, Aleve, Ibuprofen) you should stop taking it 7 days prior to your surgery.  You make take Tylenol  For pain prior to surgery.  5. Medications- If you are taking daily heart and blood pressure medications, seizure, reflux, allergy, asthma, anxiety, pain or diabetes medications, make sure the surgery center/hospital is aware before the day of surgery so they may notify you which medications to take or avoid the day of surgery. 6. No food or drink after midnight the night before surgery unless directed otherwise by surgical center/hospital staff. 7. No alcoholic beverages 24 hours prior to surgery.  No smoking 24 hours prior to or 24 hours after surgery. 8. Wear loose pants or shorts- loose enough to fit over bandages, boots, and casts. 9. No slip on shoes, sneakers are best. 10. Bring  your boot with you to the surgery center/hospital.  Also bring crutches or a walker if your physician has prescribed it for you.  If you do not have this equipment, it will be provided for you after surgery. 11. If you have not been contracted by the surgery center/hospital by the day before your surgery, call to confirm the date and time of your surgery. 12. Leave-time from work may vary depending on the type of surgery you have.  Appropriate arrangements should be made prior to surgery with your employer. 13. Prescriptions will be provided immediately following surgery by your doctor.  Have these filled as soon as possible after surgery and take the medication as directed. 14. Remove nail polish on the operative foot. 15. Wash the night before surgery.  The night before surgery wash the foot and leg well with the antibacterial soap provided and water paying special attention to beneath the toenails and in between the toes.  Rinse thoroughly with water and dry well with a towel.  Perform this wash unless told not to do so by your physician.  Enclosed: 1 Ice pack (please put in freezer the night before surgery)   1 Hibiclens skin cleaner   Pre-op Instructions  If you have any questions regarding the instructions, do not hesitate to call our office.  [place]: [address] [place], [place] [address] [phone]  [place]: [address] [place], [place] [address] [phone]  [place]: [address]  , [place] [address] [phone]   Dr.   Ila Mcgill DPM, Dr. Celesta Gentile DPM, Dr. Lanelle Bal DPM, Dr. Landis Martins DPM

## 2016-07-30 ENCOUNTER — Other Ambulatory Visit (INDEPENDENT_AMBULATORY_CARE_PROVIDER_SITE_OTHER): Payer: BC Managed Care – PPO

## 2016-07-30 ENCOUNTER — Ambulatory Visit (INDEPENDENT_AMBULATORY_CARE_PROVIDER_SITE_OTHER): Payer: BC Managed Care – PPO | Admitting: Internal Medicine

## 2016-07-30 ENCOUNTER — Encounter: Payer: Self-pay | Admitting: Internal Medicine

## 2016-07-30 ENCOUNTER — Ambulatory Visit (HOSPITAL_COMMUNITY)
Admission: RE | Admit: 2016-07-30 | Discharge: 2016-07-30 | Disposition: A | Discharge status: Home / Self Care | Payer: BC Managed Care – PPO | Source: Ambulatory Visit | Attending: Internal Medicine | Admitting: Internal Medicine

## 2016-07-30 VITALS — BP 122/70 | HR 94 | Ht 66.5 in | Wt 262.0 lb

## 2016-07-30 DIAGNOSIS — R0600 Dyspnea, unspecified: Secondary | ICD-10-CM | POA: Insufficient documentation

## 2016-07-30 DIAGNOSIS — K219 Gastro-esophageal reflux disease without esophagitis: Secondary | ICD-10-CM

## 2016-07-30 DIAGNOSIS — D869 Sarcoidosis, unspecified: Secondary | ICD-10-CM | POA: Diagnosis not present

## 2016-07-30 LAB — CBC WITH DIFFERENTIAL/PLATELET
Basophils Absolute: 0 10*3/uL (ref 0.0–0.1)
Basophils Relative: 0.3 % (ref 0.0–3.0)
EOS PCT: 2.1 % (ref 0.0–5.0)
Eosinophils Absolute: 0.2 10*3/uL (ref 0.0–0.7)
HCT: 36.6 % (ref 36.0–46.0)
Hemoglobin: 12.2 g/dL (ref 12.0–15.0)
Lymphocytes Relative: 19.2 % (ref 12.0–46.0)
Lymphs Abs: 1.7 10*3/uL (ref 0.7–4.0)
MCHC: 33.5 g/dL (ref 30.0–36.0)
MCV: 77.3 fl — AB (ref 78.0–100.0)
Monocytes Absolute: 0.6 10*3/uL (ref 0.1–1.0)
Monocytes Relative: 7.1 % (ref 3.0–12.0)
NEUTROS ABS: 6.1 10*3/uL (ref 1.4–7.7)
NEUTROS PCT: 71.3 % (ref 43.0–77.0)
PLATELETS: 271 10*3/uL (ref 150.0–400.0)
RBC: 4.73 Mil/uL (ref 3.87–5.11)
RDW: 14.4 % (ref 11.5–15.5)
WBC: 8.6 10*3/uL (ref 4.0–10.5)

## 2016-07-30 LAB — PULMONARY FUNCTION TEST
DL/VA % pred: 89 %
DL/VA: 4.59 ml/min/mmHg/L
DLCO UNC: 22.87 ml/min/mmHg
DLCO unc % pred: 80 %
FEF 25-75 PRE: 2.59 L/s
FEF 25-75 Post: 2.73 L/sec
FEF2575-%Change-Post: 5 %
FEF2575-%Pred-Post: 126 %
FEF2575-%Pred-Pre: 120 %
FEV1-%Change-Post: 1 %
FEV1-%Pred-Post: 117 %
FEV1-%Pred-Pre: 114 %
FEV1-POST: 2.66 L
FEV1-Pre: 2.61 L
FEV1FVC-%Change-Post: 2 %
FEV1FVC-%PRED-PRE: 101 %
FEV6-%CHANGE-POST: 0 %
FEV6-%PRED-POST: 116 %
FEV6-%Pred-Pre: 116 %
FEV6-POST: 3.26 L
FEV6-Pre: 3.26 L
FEV6FVC-%PRED-POST: 103 %
FEV6FVC-%Pred-Pre: 103 %
FVC-%Change-Post: 0 %
FVC-%Pred-Post: 112 %
FVC-%Pred-Pre: 112 %
FVC-Post: 3.26 L
FVC-Pre: 3.26 L
PRE FEV6/FVC RATIO: 100 %
Post FEV1/FVC ratio: 82 %
Post FEV6/FVC ratio: 100 %
Pre FEV1/FVC ratio: 80 %
RV % PRED: 93 %
RV: 2.07 L
TLC % pred: 98 %
TLC: 5.43 L

## 2016-07-30 LAB — BRAIN NATRIURETIC PEPTIDE: Pro B Natriuretic peptide (BNP): 34 pg/mL (ref 0.0–100.0)

## 2016-07-30 LAB — TSH: TSH: 3.3 u[IU]/mL (ref 0.35–4.50)

## 2016-07-30 MED ORDER — ALBUTEROL SULFATE (2.5 MG/3ML) 0.083% IN NEBU
2.5000 mg | INHALATION_SOLUTION | Freq: Once | RESPIRATORY_TRACT | Status: AC
  Administered 2016-07-30: 2.5 mg via RESPIRATORY_TRACT

## 2016-07-30 NOTE — Patient Instructions (Addendum)
Weight control is simply a matter of calorie balance which needs to be tilted in your favor by eating less and exercising more.  To get the most out of exercise, you need to be continuously aware that you are short of breath, but never out of breath, for 30 minutes daily. As you improve, it will actually be easier for you to do the same amount of exercise  in  30 minutes so always push to the level where you are short of breath.  If this does not result in gradual weight reduction then I strongly recommend you see a nutritionist with a food diary x 2 weeks so that we can work out a negative calorie balance which is universally effective in steady weight loss programs.  Think of your calorie balance like you do your bank account where in this case you want the balance to go down so you must take in less calories than you burn up.  It's just that simple:  Hard to do, but easy to understand.  Good luck!   Please remember to go to the lab   department downstairs for your tests - we will call you with the results when they are available.     Pulmonary follow up is as needed

## 2016-07-30 NOTE — Progress Notes (Signed)
Subjective:   Patient ID: Kristine [name], female    DOB: [birth year],    MRN: [id number]   Brief patient profile:  59 yobf never smoker dx with sarcoid / iritis on and off prednisone mostly for eye issues with wt gain x around 20 lbs > indolent onset of progressive sob x feb 2015 both at rest and also with exertion with some chest tightness and neg cardiac w/u including LHC 09/12/14 with nl LV  so referred to pulmonary clinic 09/30/2014 by Dr Uvaldo Bristle with original dx of sarcoid in 1998 at wt 240  baseline   History of Present Illness  09/30/2014 1st [facility] office visit/ Prisilla Kocsis  / wt 6  Chief Complaint  Patient presents with  . Pulmonary Consult    Referred by Dr. Renold Genta. Pt states that she was dxed with Sarcoid in 1998. She c/o increased SOB x 6 months. She states that she gets SOB with or without any exertion.    indolent onset gradually worse assoc with overt HB ? Worse x one year but not cough while on acei  Most ex s stopping = malls but slowed by fatgiue  No noct symptoms at all. rec Stop lisinopril and start diovan 160 mg daily in its place Try prilosec 68m  Take 30-60 min before first meal of the day and Pepcid 20 mg one bedtime  GERD diet   02/17/2015  reconcult re ? Sarcoid acitivity  / sob resolved p above ov then worse on timolol eyedrops Chief Complaint  Patient presents with  . Follow-up    Pt states still has SOB. Thinks it is related to eye medication. Denies fever, chest pain, tightness or cough   bad flare white out R eye > improved  Could not tolerate timolol due to sob  Breathing is back to baseline  And tolerating  low dose  Timolol   Last prednisone by mouth was sev years prior to OV   rec There is no evidence of significant enough active systemic or pulmonary sarcoid to warrant putting you back on prednisone (but may be needed for your eyes if topical steroids not effective/ dosing/monitoring would be per Dr HHerbert Deaner  If you have any new changes  on your chest xray or unexplained worsening in your breathing then you return here for pfts     07/02/2016  Re-establish extended ov/Ikechukwu Cerny re: sarocid with worse sob  Chief Complaint  Patient presents with  . Follow-up    pt c/o worsening sob with exertion    gradual onset doe x one month doe assoc with minimal cough / dry  Esp outside in heat  Sleeping ok / some increased dyspepsia /[facility]= can't walk a nl pace on a flat grade s sob but does fine slow and flat eg shopping leaning on cart  rec  Pantoprazole (protonix) 40 mg   Take  30-60 min before first meal of the day and Pepcid (famotidine)  20 mg one @  bedtime until return to office - this is the best way to tell whether stomach acid is contributing to your problem.   GERD diet     07/30/2016  f/u ov/Chayce Robbins re: unexplained sob  Chief Complaint  Patient presents with  . Follow-up    PFT's done today. Pt states that her breathing has improved slightly.    still MMRC2/ no longer coughing     No obvious day to day or daytime variabilty or assoc chronic cough or cp  or chest tightness, subjective wheeze overt sinus  symptoms. No unusual exp hx or h/o childhood pna/ asthma or knowledge of premature birth.  Sleeping ok without nocturnal  or early am exacerbation  of respiratory  c/o's or need for noct saba. Also denies any obvious fluctuation of symptoms with weather or environmental changes or other aggravating or alleviating factors except as outlined above   Current Medications, Allergies, Complete Past Medical History, Past Surgical History, Family History, and Social History were reviewed in [employer] record.  ROS  The following are not active complaints unless bolded sore throat, dysphagia, dental problems, itching, sneezing,  nasal congestion or excess/ purulent secretions, ear ache,   fever, chills, sweats, unintended wt loss, pleuritic or exertional cp, hemoptysis,  orthopnea pnd or leg swelling,  presyncope, palpitations, heartburn, abdominal pain, anorexia, nausea, vomiting, diarrhea  or change in bowel or urinary habits, change in stools or urine, dysuria,hematuria,  rash, arthralgias, visual complaints have resolved  headache, numbness weakness or ataxia or problems with walking or coordination,  change in mood/affect or memory.           Objective:   Physical Exam  amb bf nad   02/17/2015          260 > 07/02/2016   07/02/2016  259  > 07/30/2016  262     02/17/15 260 lb (117.935 kg)  09/30/14 260 lb 9.6 oz (118.207 kg)  09/12/14 250 lb (113.399 kg)    Vital signs reviewed  - 97% on RA on arrival  HEENT: nl dentition, turbinates, and orophanx. Nl external ear canals without cough reflex   NECK :  without JVD/Nodes/TM/ nl carotid upstrokes bilaterally   LUNGS: no acc muscle use, clear to A and P bilaterally without cough on insp or exp maneuvers   CV:  RRR  no s3 or murmur or increase in P2, no edema   ABD:  soft and nontender with nl excursion in the supine position. No bruits or organomegaly, bowel sounds nl  MS:  warm without deformities, calf tenderness, cyanosis or clubbing - R ankle splint   SKIN: warm and dry without lesions    NEURO:  alert, approp, no deficits      CXR PA and Lateral:   07/02/2016 :    I personally reviewed images and agree with radiology impression as follows:    Persistent hilar adenopathy.  No acute abnormalities.  Labs ordered/ reviewed:       Lab Results  Component Value Date   WBC 8.6 07/30/2016   HGB 12.2 07/30/2016   HCT 36.6 07/30/2016   MCV 77.3 (L) 07/30/2016   PLT 271.0 07/30/2016   Note mcv same value back in epic recordx x 7 years but never anemic     Lab Results  Component Value Date   TSH 3.30 07/30/2016     Lab Results  Component Value Date   PROBNP 34.0 07/30/2016                Assessment & Plan:   Outpatient Encounter Prescriptions as of 07/30/2016  Medication Sig  . aspirin EC 81 MG  tablet Take 1 tablet (81 mg total) by mouth daily.  . Brinzolamide-Brimonidine (SIMBRINZA) 1-0.2 % SUSP Apply 1 drop to eye 2 (two) times daily.  . Cholecalciferol (VITAMIN D3) 5000 UNITS CAPS Take 5,000 Units by mouth daily.  . famotidine (PEPCID) 20 MG tablet One at bedtime  . furosemide (LASIX) 20 MG tablet Take 20 mg  by mouth daily as needed for fluid.   . pantoprazole (PROTONIX) 40 MG tablet Take 1 tablet (40 mg total) by mouth daily. Take 30-60 min before first meal of the day  . [DISCONTINUED] DULoxetine (CYMBALTA) 30 MG capsule Take 30 mg by mouth every evening.   No facility-administered encounter medications on file as of 07/30/2016.

## 2016-07-31 NOTE — Assessment & Plan Note (Signed)
Body mass index is 41.65 and still trending up   Lab Results  Component Value Date   TSH 3.30 07/30/2016     Contributing to gerd tendency/ doe/reviewed the need and the process to achieve and maintain neg calorie balance > defer f/u primary care including intermittently monitoring thyroid status

## 2016-07-31 NOTE — Assessment & Plan Note (Signed)
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

## 2016-07-31 NOTE — Assessment & Plan Note (Signed)
Would continue empirical rx x 3 months then try to wean and see if cough recurs and if so consider GI w/u next - reviewed wt loss will help the most

## 2016-07-31 NOTE — Assessment & Plan Note (Signed)
01/11/14 pfts wnl except ERV 40% at wt 251 09/30/2014  Walked RA x 3 laps @ 185 ft each stopped due to  End of study, nl pace, no desat  - 07/02/2016  Walked RA x 2 laps @ 185 ft each stopped due to ankle pain,  with sats 92% at slow to nl pace - PFT's  07/30/2016  Nl except erv 43% at wt 262   Improved somewhat and elimination of cough on gerd rx with nothing to point to other than wt and deconditioning and don't feel further pulmonary w/u or interventions will be fruitful here   I had an extended summary discussion with the patient reviewing all relevant studies completed to date and  lasting 15 to 20 minutes of a 25 minute visit    Each maintenance medication was reviewed in detail including most importantly the difference between maintenance and prns and under what circumstances the prns are to be triggered using an action plan format that is not reflected in the computer generated alphabetically organized AVS.    Please see instructions for details which were reviewed in writing and the patient given a copy highlighting the part that I personally wrote and discussed at today's ov.

## 2016-08-02 NOTE — Progress Notes (Signed)
LMTCB

## 2016-08-05 NOTE — Progress Notes (Signed)
lmtcb

## 2016-08-10 ENCOUNTER — Telehealth: Payer: Self-pay | Admitting: *Deleted

## 2016-08-10 NOTE — Telephone Encounter (Signed)
"  Will it be an inconvenience to reschedule my surgery?"  No, that is fine.  What date are you scheduled and when would you like to reschedule to?  "I am scheduled for October 10 and I would like to reschedule it to the day after Christmas if possible."  Okay, that date, December 26, is available.  Someone from the surgical center will call you 1 to 4 days before surgery date with the arrival time.  I called and rescheduled surgery at the surgical center.  Moved surgery from October 10 to December 26.

## 2016-10-29 ENCOUNTER — Encounter: Payer: Self-pay | Admitting: *Deleted

## 2016-11-03 ENCOUNTER — Telehealth: Payer: Self-pay | Admitting: *Deleted

## 2016-11-03 NOTE — Telephone Encounter (Signed)
"  I don't have any information about my surgery.  I had rescheduled my surgery from October.  I don't know where to go."  You should have received a brochure in your blue bag.  The brochure has directions on how to get there.  The surgical center usually calls a day or two prior to surgery date.  They will give you the arrival time.  Do not eat or drink anything after midnight the night before.  The address for the surgery center is [address], [place], [place].  Their phone number is [phone].

## 2016-11-09 ENCOUNTER — Encounter: Payer: Self-pay | Admitting: Podiatry

## 2016-11-09 DIAGNOSIS — M2041 Other hammer toe(s) (acquired), right foot: Secondary | ICD-10-CM | POA: Diagnosis not present

## 2016-11-09 DIAGNOSIS — M2011 Hallux valgus (acquired), right foot: Secondary | ICD-10-CM | POA: Diagnosis not present

## 2016-11-12 ENCOUNTER — Telehealth: Payer: Self-pay | Admitting: *Deleted

## 2016-11-12 NOTE — Telephone Encounter (Signed)
Post op courtesy call:  Left message informing pt I would try to reach her on the home phone. Left message informing pt I was calling to see how she was and to quickly go over the post op sheet. I left message instructing pt not to weight bear,dangle or have the foot below her heart more than 15 mins/hour for the 1st week post op, remain in the boot at all times, may open boot and rest outside of the boot if not going to go to sleep or walk, must sleep in the boot, keep the dressing clean and dry until 1sr POV, and to call with concerns. Pt called states she is doing fine, amazingly not having a lot of pain, and I reiterated the message I left on her home phone and told her to call with concerns.

## 2016-11-17 ENCOUNTER — Ambulatory Visit (INDEPENDENT_AMBULATORY_CARE_PROVIDER_SITE_OTHER): Payer: BC Managed Care – PPO

## 2016-11-17 ENCOUNTER — Ambulatory Visit (INDEPENDENT_AMBULATORY_CARE_PROVIDER_SITE_OTHER): Payer: BC Managed Care – PPO | Admitting: Podiatry

## 2016-11-17 ENCOUNTER — Encounter: Payer: Self-pay | Admitting: Podiatry

## 2016-11-17 VITALS — Temp 96.7°F

## 2016-11-17 DIAGNOSIS — M2011 Hallux valgus (acquired), right foot: Secondary | ICD-10-CM

## 2016-11-17 DIAGNOSIS — M2040 Other hammer toe(s) (acquired), unspecified foot: Secondary | ICD-10-CM

## 2016-11-18 NOTE — Progress Notes (Signed)
Subjective:     Patient ID: Kristine [name], female   DOB: 1951-10-16, 66 y.o.   MRN: [id number]  HPI patient presents stating she's doing very well with her right foot with minimal discomfort or swelling   Review of Systems     Objective:   Physical Exam Neurovascular status intact muscle strength adequate patient's right foot doing very well with patient having good alignment and incision sites that are well coapted with no drainage. Negative Homan sign was noted    Assessment:     Doing well post forefoot reconstruction right    Plan:     H&P x-rays reviewed and sterile dressing reapplied with instructions on continued elevation immobilization compression. Reappoint in 10-14 days for suture removal or earlier if needed  X-ray indicated the osteotomy is healing well with pins in place and toes in good alignment

## 2016-11-24 ENCOUNTER — Ambulatory Visit (INDEPENDENT_AMBULATORY_CARE_PROVIDER_SITE_OTHER): Payer: BC Managed Care – PPO | Admitting: Podiatry

## 2016-11-24 DIAGNOSIS — M2040 Other hammer toe(s) (acquired), unspecified foot: Secondary | ICD-10-CM

## 2016-11-24 DIAGNOSIS — M2011 Hallux valgus (acquired), right foot: Secondary | ICD-10-CM

## 2016-11-25 NOTE — Progress Notes (Signed)
Subjective:     Patient ID: Kristine [name], female   DOB: 08-13-1951, 66 y.o.   MRN: [id number]  HPI patient states she's doing very well and is here for stitch removal and is able to ambulate and has minimal swelling   Review of Systems     Objective:   Physical Exam Neurovascular status intact muscle strength adequate with patient found to have well-healing surgical sites right with wound edges well coapted stitches in place with no drainage noted. Mild edema normal for this. Postop    Assessment:     Doing well post Austin-type osteotomy right and hammertoe repair digits right    Plan:     Stitches removed reviewed continued elevation compression immobilization and reappoint to recheck in 2-3 weeks

## 2016-12-10 ENCOUNTER — Ambulatory Visit (INDEPENDENT_AMBULATORY_CARE_PROVIDER_SITE_OTHER): Payer: Self-pay | Admitting: Podiatry

## 2016-12-10 ENCOUNTER — Encounter: Payer: Self-pay | Admitting: Podiatry

## 2016-12-10 ENCOUNTER — Ambulatory Visit (INDEPENDENT_AMBULATORY_CARE_PROVIDER_SITE_OTHER): Payer: BC Managed Care – PPO

## 2016-12-10 DIAGNOSIS — M2011 Hallux valgus (acquired), right foot: Secondary | ICD-10-CM

## 2016-12-10 DIAGNOSIS — M2040 Other hammer toe(s) (acquired), unspecified foot: Secondary | ICD-10-CM

## 2016-12-11 NOTE — Progress Notes (Signed)
Subjective:     Patient ID: Kristine [name], female   DOB: [birth year], 66 y.o.   MRN: [id number]  HPI patient states she's doing real well with her foot with mild swelling pain but overall doing well   Review of Systems     Objective:   Physical Exam Neurovascular status intact muscle strength adequate with patient's first MPJ doing well with good range of motion and alignment    Assessment:     Doing well post osteotomy right    Plan:     Advised on physical therapy anti-inflammatories reviewed final x-rays and allow patient to return to relatively normal activities  X-ray report indicated osteotomy is healing well with no indication of significant movement and joint congruence

## 2016-12-14 ENCOUNTER — Encounter: Payer: Self-pay | Admitting: Podiatry

## 2016-12-22 NOTE — Progress Notes (Signed)
DOS 12.26.2017 Austin bunionectomy (cutting and moving bone) with fixation right foot. Arthroplasty distal 3,4 right.

## 2016-12-23 ENCOUNTER — Ambulatory Visit (INDEPENDENT_AMBULATORY_CARE_PROVIDER_SITE_OTHER): Payer: BC Managed Care – PPO | Admitting: Internal Medicine

## 2016-12-23 ENCOUNTER — Encounter: Payer: Self-pay | Admitting: *Deleted

## 2016-12-23 ENCOUNTER — Ambulatory Visit (INDEPENDENT_AMBULATORY_CARE_PROVIDER_SITE_OTHER)
Admission: RE | Admit: 2016-12-23 | Discharge: 2016-12-23 | Disposition: A | Discharge status: Home / Self Care | Payer: BC Managed Care – PPO | Source: Ambulatory Visit | Attending: Internal Medicine | Admitting: Internal Medicine

## 2016-12-23 ENCOUNTER — Encounter: Payer: Self-pay | Admitting: Internal Medicine

## 2016-12-23 VITALS — BP 150/90 | HR 80 | Ht 66.0 in | Wt 255.8 lb

## 2016-12-23 DIAGNOSIS — R0789 Other chest pain: Secondary | ICD-10-CM

## 2016-12-23 DIAGNOSIS — D869 Sarcoidosis, unspecified: Secondary | ICD-10-CM | POA: Diagnosis not present

## 2016-12-23 DIAGNOSIS — R05 Cough: Secondary | ICD-10-CM

## 2016-12-23 DIAGNOSIS — I10 Essential (primary) hypertension: Secondary | ICD-10-CM

## 2016-12-23 DIAGNOSIS — R058 Other specified cough: Secondary | ICD-10-CM

## 2016-12-23 MED ORDER — AMOXICILLIN-POT CLAVULANATE 875-125 MG PO TABS: 1.0000 | ORAL_TABLET | Freq: Two times a day (BID) | ORAL | 0 refills | Status: AC

## 2016-12-23 MED ORDER — TRAMADOL HCL 50 MG PO TABS: ORAL_TABLET | ORAL | 0 refills | Status: DC

## 2016-12-23 MED ORDER — PREDNISONE 10 MG PO TABS: ORAL_TABLET | ORAL | 0 refills | Status: DC

## 2016-12-23 MED ORDER — PANTOPRAZOLE SODIUM 40 MG PO TBEC: 40.0000 mg | DELAYED_RELEASE_TABLET | Freq: Every day | ORAL | 2 refills | Status: DC

## 2016-12-23 MED ORDER — FAMOTIDINE 20 MG PO TABS: ORAL_TABLET | ORAL | 11 refills | Status: DC

## 2016-12-23 NOTE — Assessment & Plan Note (Signed)
Body mass index is 41.29 kg/m.  trending down Lab Results  Component Value Date   TSH 3.30 07/30/2016     Contributing to gerd risk/ doe/reviewed the need and the process to achieve and maintain neg calorie balance > defer f/u primary care including intermittently monitoring thyroid status

## 2016-12-23 NOTE — Progress Notes (Signed)
Spoke with pt and notified of results per Dr. Wert. Pt verbalized understanding and denied any questions. 

## 2016-12-23 NOTE — Patient Instructions (Addendum)
Stop flonase  Pantoprazole (protonix) 40 mg   Take  30-60 min before first meal of the day and Pepcid (famotidine)  20 mg one @  bedtime until return to office - this is the best way to tell whether stomach acid is contributing to your problem.    GERD (REFLUX)  is an extremely common cause of respiratory symptoms just like yours , many times with no obvious heartburn at all.    It can be treated with medication, but also with lifestyle changes including elevation of the head of your bed (ideally with 6 inch  bed blocks),  Smoking cessation, avoidance of late meals, excessive alcohol, and avoid fatty foods, chocolate, peppermint, colas, red wine, and acidic juices such as orange juice.  NO MINT OR MENTHOL PRODUCTS SO NO COUGH DROPS  USE SUGARLESS CANDY INSTEAD (Jolley ranchers or Stover's or Life Savers) or even ice chips will also do - the key is to swallow to prevent all throat clearing. NO OIL BASED VITAMINS - use powdered substitutes.     Augmentin 875 mg take one pill twice daily  X 10 days - take at breakfast and supper with large glass of water.  It would help reduce the usual side effects (diarrhea and yeast infections) if you ate cultured yogurt at lunch.   Prednisone 10 mg take  4 each am x 2 days,   2 each am x 2 days,  1 each am x 2 days and stop   If not a lot  Better by first of the week  [id number] for sinus ct    Take delsym two tsp every 12 hours and supplement if needed with  tramadol 50 mg up to 2 every 4 hours to suppress the urge to cough. Swallowing water or using ice chips/non mint and menthol containing candies (such as lifesavers or sugarless jolly ranchers) are also effective.  You should rest your voice and avoid activities that you know make you cough.  Once you have eliminated the cough for 3 straight days try reducing the tramadol first,  then the delsym as tolerated.    Please remember to go to the x-ray department downstairs in the basement  for your tests - we  will call you with the results when they are available.

## 2016-12-23 NOTE — Progress Notes (Signed)
Subjective:   Patient ID: Kristine [name], female    DOB: 18-Dec-1950,    MRN: [id number]   Brief patient profile:  89 yobf never smoker dx with sarcoid / iritis on and off prednisone mostly for eye issues with wt gain x around 20 lbs > indolent onset of progressive sob x feb 2015 both at rest and also with exertion with some chest tightness and neg cardiac w/u including LHC 09/12/14 with nl LV  so referred to pulmonary clinic 09/30/2014 by Kristine [name] with original dx of sarcoid in 1998 at wt 240  baseline   History of Present Illness  09/30/2014 1st [facility] office visit/ Kristine [name]  / wt 61  Chief Complaint  Patient presents with  . Pulmonary Consult    Referred by Kristine [name]. Pt states that she was dxed with Sarcoid in 1998. She c/o increased SOB x 6 months. She states that she gets SOB with or without any exertion.    indolent onset gradually worse assoc with overt HB ? Worse x one year but not cough while on acei  Most ex s stopping = malls but slowed by fatgiue  No noct symptoms at all. rec Stop lisinopril and start diovan 160 mg daily in its place Try prilosec 29m  Take 30-60 min before first meal of the day and Pepcid 20 mg one bedtime  GERD diet   02/17/2015  reconcult re ? Sarcoid acitivity  / sob resolved p above ov then worse on timolol eyedrops Chief Complaint  Patient presents with  . Follow-up    Pt states still has SOB. Thinks it is related to eye medication. Denies fever, chest pain, tightness or cough   bad flare white out R eye > improved  Could not tolerate timolol due to sob  Breathing is back to baseline  And tolerating  low dose  Timolol   Last prednisone by mouth was sev years prior to OV   rec There is no evidence of significant enough active systemic or pulmonary sarcoid to warrant putting you back on prednisone (but may be needed for your eyes if topical steroids not effective/ dosing/monitoring would be per Kristine [name]  If you have any new changes  on your chest xray or unexplained worsening in your breathing then you return here for pfts     07/02/2016  Re-establish extended ov/Kristine [name] re: sarocid with worse sob  Chief Complaint  Patient presents with  . Follow-up    pt c/o worsening sob with exertion    gradual onset doe x one month doe assoc with minimal cough / dry  Esp outside in heat  Sleeping ok / some increased dyspepsia /[name]= can't walk a nl pace on a flat grade s sob but does fine slow and flat eg shopping leaning on cart  rec  Pantoprazole (protonix) 40 mg   Take  30-60 min before first meal of the day and Pepcid (famotidine)  20 mg one @  bedtime until return to office - this is the best way to tell whether stomach acid is contributing to your problem.   GERD diet  Neg cal balance       12/23/2016 acute extended ov/Kristine [name] re: new cough developed off gerd rx  Chief Complaint  Patient presents with  . Acute Visit    Pt c/o increased cough and left side pain x 3 wks.   breathing was improving prior foot surgery stopped gerd rx one week prior  Dec 26th 2017 then started with cough  Maybe mid Jan 2018> seen UC Dec 05 2016 dx bronchitis/sinusitis though cough was severe and not productive > rx abx/ cough meds  > cough no better > Jan 29th another abx and depo and more cough syrup > no better  L side pain positional not pleuritic   Cough is most noticeable  During the day not noct / some nasal congestion also   No obvious day to day or daytime variability or assoc excess/ purulent sputum or mucus plugs or hemoptysis or chest tightness, subjective wheeze or overt  hb symptoms. No unusual exp hx or h/o childhood pna/ asthma or knowledge of premature birth.  Sleeping ok without nocturnal  or early am exacerbation  of respiratory  c/o's or need for noct saba. Also denies any obvious fluctuation of symptoms with weather or environmental changes or other aggravating or alleviating factors except as outlined above   Current  Medications, Allergies, Complete Past Medical History, Past Surgical History, Family History, and Social History were reviewed in [employer] record.  ROS  The following are not active complaints unless bolded sore throat, dysphagia, dental problems, itching, sneezing,  nasal congestion or excess/ purulent secretions, ear ache,   fever, chills, sweats, unintended wt loss, classically pleuritic or exertional cp,  orthopnea pnd or leg swelling, presyncope, palpitations, abdominal pain, anorexia, nausea, vomiting, diarrhea  or change in bowel or bladder habits, change in stools or urine, dysuria,hematuria,  rash, arthralgias, visual complaints, headache, numbness, weakness or ataxia or problems with walking or coordination,  change in mood/affect or memory.               Objective:   Physical Exam  amb bf nad   02/17/2015          260 > 07/02/2016   07/02/2016  259  > 07/30/2016  262 > 12/23/2016   256     02/17/15 260 lb (117.935 kg)  09/30/14 260 lb 9.6 oz (118.207 kg)  09/12/14 250 lb (113.399 kg)    Vital signs reviewed  - 100% on RA on arrival and note BP 150/90 ? p taking bp meds  HEENT: nl dentition,   and oropharynx. Nl external ear canals without cough reflex - severe  bilateral non-specific turbinate edema  And mucopurulent secretions    NECK :  without JVD/Nodes/TM/ nl carotid upstrokes bilaterally   LUNGS: no acc muscle use, clear to A and P bilaterally without cough on insp or exp maneuvers   CV:  RRR  no s3 or murmur or increase in P2, no edema   ABD:  soft and nontender with nl excursion in the supine position. No bruits or organomegaly, bowel sounds nl  MS:  warm without deformities, calf tenderness, cyanosis or clubbing    SKIN: warm and dry without lesions    NEURO:  alert, approp, no deficits      CXR PA and Lateral:   07/02/2016 :    I personally reviewed images and agree with radiology impression as follows:    Persistent hilar  adenopathy.  No acute abnormalities.           Assessment & Plan:

## 2016-12-27 ENCOUNTER — Encounter: Payer: Self-pay | Admitting: Internal Medicine

## 2016-12-27 NOTE — Assessment & Plan Note (Signed)
Continuous x 3 weeks, not pleuritic but with nl cxr most likely [facility] ? From coughing > rx by controlling cough.

## 2016-12-27 NOTE — Assessment & Plan Note (Signed)
Not ideal > rec avoid salt/ keep up with meds > Follow up per Primary Care planned

## 2016-12-27 NOTE — Assessment & Plan Note (Signed)
A good rule of thumb is that >95% of pts with active sarcoid in any organ will have some plain cxr changes - on the other hand  if there are active pulmonary symptoms the cxr will look much worse than the patient:  No evidence of either scenario here/ strongly doubt active dz

## 2016-12-27 NOTE — Assessment & Plan Note (Addendum)
Flare p ankle surgery. Upper airway cough syndrome (previously labeled PNDS) , is  so named because it's frequently impossible to sort out how much is  CR/sinusitis with freq throat clearing (which can be related to primary GERD)   vs  causing  secondary (" extra esophageal")  GERD from wide swings in gastric pressure that occur with throat clearing, often  promoting self use of mint and menthol lozenges that reduce the lower esophageal sphincter tone and exacerbate the problem further in a cyclical fashion.   These are the same pts (now being labeled as having "irritable larynx syndrome" by some cough centers) who not infrequently have a history of having failed to tolerate ace inhibitors,  dry powder inhalers or biphosphonates or report having atypical/extraesophageal reflux symptoms that don't respond to standard doses of PPI  and are easily confused as having aecopd or asthma flares by even experienced allergists/ pulmonologists (myself included).  Of the three most common causes of chronic cough, only one (GERD)  can actually cause the other two (asthma and post nasal drip syndrome)  and perpetuate the cylce of cough inducing airway trauma, inflammation, heightened sensitivity to reflux which is prompted by the cough itself via a cyclical mechanism.    This may partially respond to steroids and look like asthma and post nasal drainage but never erradicated completely unless the cough and the secondary reflux are eliminated, preferably both at the same time.  While not intuitively obvious, many patients with chronic low grade reflux do not cough until there is a secondary insult that disturbs the protective epithelial barrier and exposes sensitive nerve endings.  This can be viral or direct physical injury such as with an endotracheal tube.   The point is that once this occurs, it is difficult to eliminate using anything but a maximally effective acid suppression regimen at least in the short run,  accompanied by an appropriate diet to address non acid GERD.   rec max rx for gerd/cyclical coughing/  sinusitis and f/u next with sinus ct if not better  I had an extended discussion with the patient reviewing all relevant studies completed to date and  lasting 25 minutes of a 40  minute acute office visit focused on refractory non-specific but potentially very serious refractory respiratory symptoms of unknown etiology.  Each maintenance medication was reviewed in detail including most importantly the difference between maintenance and prns and under what circumstances the prns are to be triggered using an action plan format that is not reflected in the computer generated alphabetically organized AVS.    Please see AVS for specific instructions unique to this office visit that I personally wrote and verbalized to the the pt in detail and then reviewed with pt  by my nurse highlighting any changes in therapy/plan of care  recommended at today's visit.

## 2017-01-07 ENCOUNTER — Ambulatory Visit (INDEPENDENT_AMBULATORY_CARE_PROVIDER_SITE_OTHER): Payer: BC Managed Care – PPO

## 2017-01-07 ENCOUNTER — Ambulatory Visit (INDEPENDENT_AMBULATORY_CARE_PROVIDER_SITE_OTHER): Payer: Self-pay | Admitting: Podiatry

## 2017-01-07 DIAGNOSIS — M21619 Bunion of unspecified foot: Secondary | ICD-10-CM

## 2017-01-07 DIAGNOSIS — M2040 Other hammer toe(s) (acquired), unspecified foot: Secondary | ICD-10-CM | POA: Diagnosis not present

## 2017-01-07 DIAGNOSIS — M2011 Hallux valgus (acquired), right foot: Secondary | ICD-10-CM | POA: Diagnosis not present

## 2017-01-08 NOTE — Progress Notes (Signed)
Subjective:     Patient ID: Kristine [name], female   DOB: 1951/05/07, 66 y.o.   MRN: [id number]  HPI patient states she's doing real well with her right foot with mild edema still present but good structure and states that her foot feels better in shoe gear which she is wearing currently   Review of Systems     Objective:   Physical Exam Neurovascular status intact negative Homans sign was noted with well coapted incision sites and slight deviation hallux against the second toe but not symptomatic with digits 3 and 4 healing well with wound edges in good alignment    Assessment:     Doing well post osteotomy right first metatarsal and digital procedures digits 3 right with patient and allowed to return to normal activity but no jumping on her foot any time. Know intense activities recommended    Plan:     Doing well post surgery of the right foot  X-ray report indicates pins are in alignment there still is some residual deformity but it is not clinically significant for patient and she is not having pain

## 2017-01-14 ENCOUNTER — Telehealth: Payer: Self-pay | Admitting: Internal Medicine

## 2017-01-14 DIAGNOSIS — R05 Cough: Secondary | ICD-10-CM

## 2017-01-14 DIAGNOSIS — R059 Cough, unspecified: Secondary | ICD-10-CM

## 2017-01-14 NOTE — Telephone Encounter (Signed)
Spoke with patient-aware of Ct sinus and OV made for 01/27/17 at 845am. Order placed and nothing more needed at this time.

## 2017-01-14 NOTE — Telephone Encounter (Signed)
Spoke with pt, who states she has some improvement since last OV on 2/8, but not completely better. c/o lingering prod cough with yellow mucus, occ wheezing, postnasal drip & mild chest congestion. Pt states MW mentioned if symptoms continued after abx, then a possible CT sinus may need to be performed. Pt would like to know what the next steps are. Denies denies fever or chills. Taken mucinex with mild relief  MW please advise. Thanks.

## 2017-01-14 NOTE — Telephone Encounter (Signed)
Limited sinus CT then ov with all meds in hand Dx cough

## 2017-01-20 ENCOUNTER — Ambulatory Visit (INDEPENDENT_AMBULATORY_CARE_PROVIDER_SITE_OTHER)
Admission: RE | Admit: 2017-01-20 | Discharge: 2017-01-20 | Disposition: A | Discharge status: Home / Self Care | Payer: BC Managed Care – PPO | Source: Ambulatory Visit | Attending: Internal Medicine | Admitting: Internal Medicine

## 2017-01-20 DIAGNOSIS — R059 Cough, unspecified: Secondary | ICD-10-CM

## 2017-01-20 DIAGNOSIS — R05 Cough: Secondary | ICD-10-CM

## 2017-01-20 NOTE — Progress Notes (Signed)
Spoke with pt and notified of results per Dr. Wert. Pt verbalized understanding and denied any questions. 

## 2017-01-24 ENCOUNTER — Ambulatory Visit: Payer: BC Managed Care – PPO | Admitting: Internal Medicine

## 2017-01-27 ENCOUNTER — Ambulatory Visit: Payer: BC Managed Care – PPO | Admitting: Internal Medicine

## 2017-02-21 ENCOUNTER — Ambulatory Visit (INDEPENDENT_AMBULATORY_CARE_PROVIDER_SITE_OTHER): Payer: Self-pay | Admitting: Podiatry

## 2017-02-21 ENCOUNTER — Ambulatory Visit (INDEPENDENT_AMBULATORY_CARE_PROVIDER_SITE_OTHER): Payer: BC Managed Care – PPO

## 2017-02-21 ENCOUNTER — Encounter: Payer: Self-pay | Admitting: Podiatry

## 2017-02-21 DIAGNOSIS — M2011 Hallux valgus (acquired), right foot: Secondary | ICD-10-CM

## 2017-02-23 NOTE — Progress Notes (Signed)
Subjective:     Patient ID: Kristine [name], female   DOB: June 03, 1951, 66 y.o.   MRN: [id number]  HPI patient presents stating she's doing well with her surgery and wearing motion is but still gets occasional swelling   Review of Systems     Objective:   Physical Exam Neurovascular status intact negative Homans sign noted with well position right first metatarsal with mild varus rotation of the toe and slight thickness of the incision site which appears to be softening with no indication of keloid    Assessment:     Mild edema still present right which is normal due to moderate obesity and other issues along with mild thickness of the incision site    Plan:     Reviewed x-rays and patient can use vitamin E on the incision site and is advised to gradually return to all shoe gear and will be seen back as needed  X-rays indicate there is mild residual deformity but clinically it is functioning well and we expected this during the preoperative. Due to the fact we did not do a more invasive procedure

## 2017-03-31 ENCOUNTER — Ambulatory Visit: Payer: BC Managed Care – PPO | Admitting: Internal Medicine

## 2017-04-15 ENCOUNTER — Ambulatory Visit: Payer: BC Managed Care – PPO | Admitting: Internal Medicine

## 2017-04-15 IMAGING — DX DG CHEST 2V
2 series · 2 of 2 positions shown · non-contrast
Comparison: PA and lateral chest x-ray dated July 02, 2016

CLINICAL DATA: Productive cough, wheezing, and sinus congestion.
Recently treated for bronchitis without relief from antibiotics.
History of sarcoidosis, hypertension.

EXAM:
CHEST  2 VIEW

[chest pa]
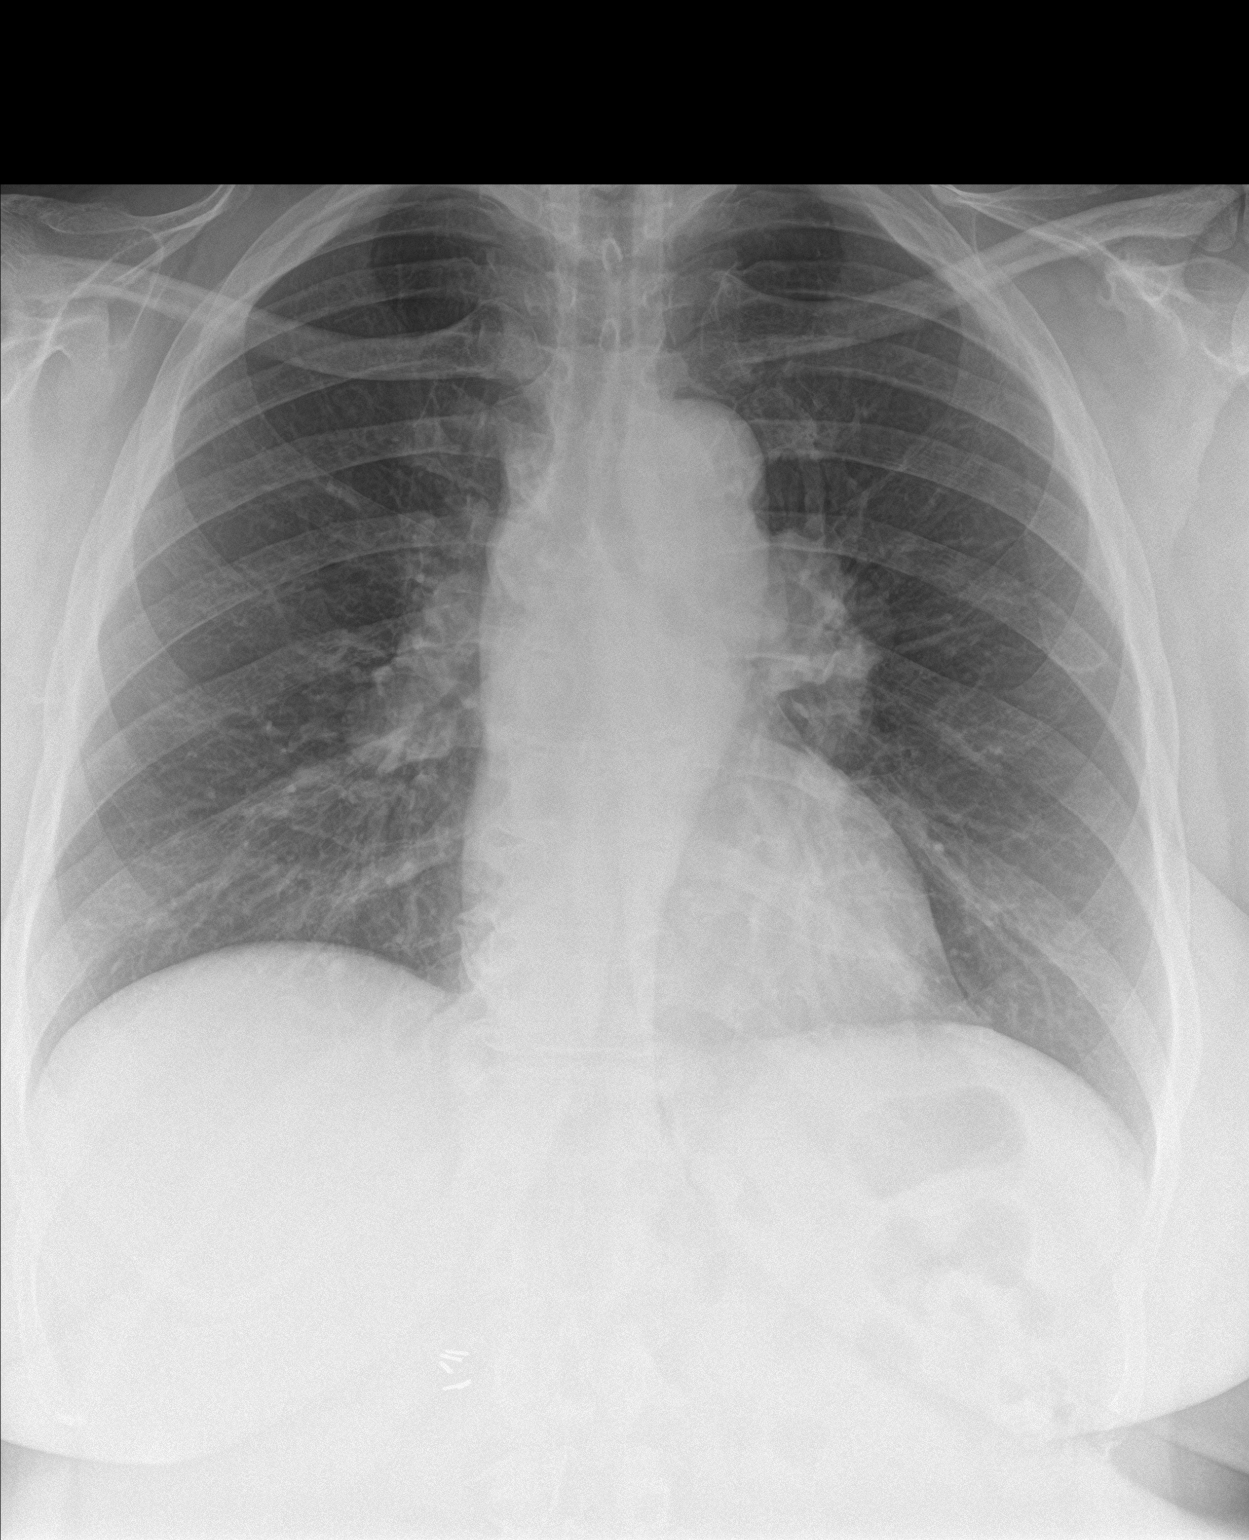

[chest lat]
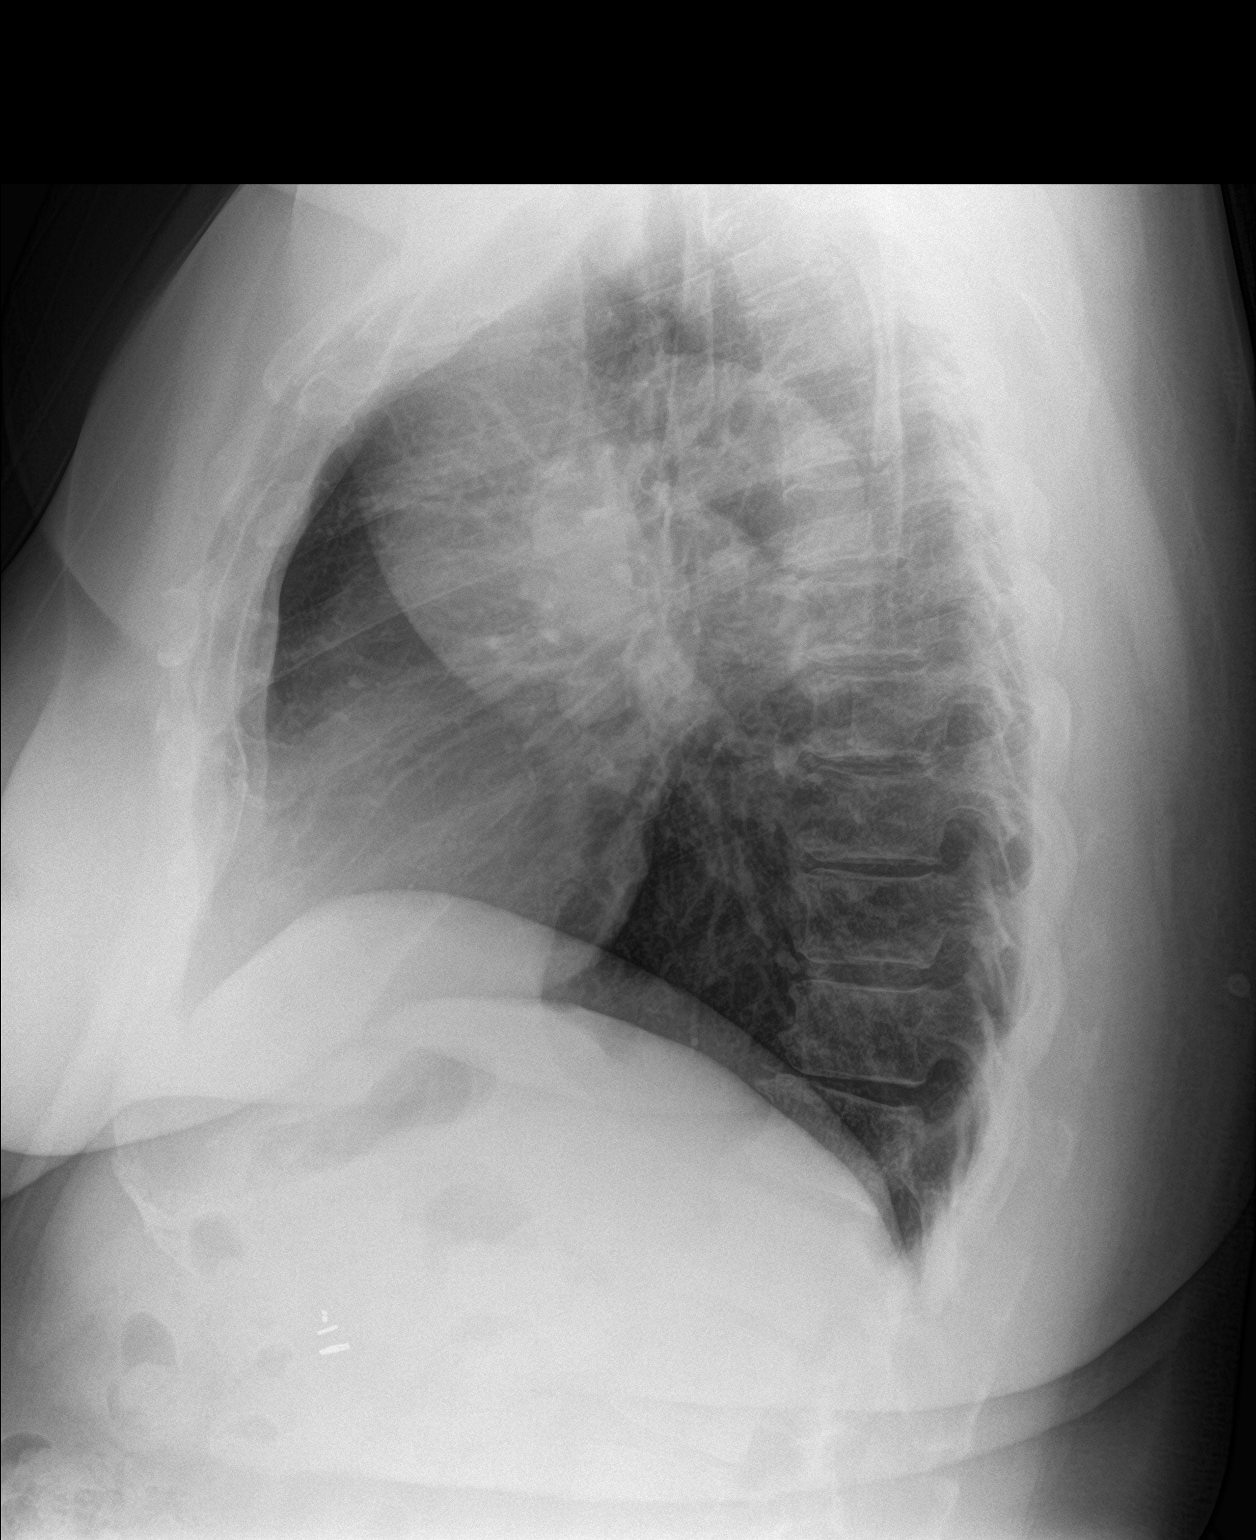

[2 of 2 positions shown; findings below may reference images not displayed]

FINDINGS: The lungs are adequately inflated. There is no focal infiltrate.
There is no pleural effusion. There is stable bilateral hilar
lymphadenopathy. The heart and pulmonary vascularity are normal. The
mediastinum is normal in width. The bony thorax exhibits no acute
abnormality.
IMPRESSION: No evidence of pneumonia nor CHF. Stable bilateral hilar lymph node
enlargement.

## 2017-05-13 IMAGING — CT CT PARANASAL SINUSES LIMITED
1 of 2 series · 7 of 10 positions shown, 9 images · non-contrast
Comparison: None.

CLINICAL DATA: Sinus and chest congestion since [REDACTED].
Completed multiple rounds of antibiotics without improvement.
History of sarcoidosis.

EXAM:
CT PARANASAL SINUS LIMITED WITHOUT CONTRAST
TECHNIQUE: Nine non contiguous multidetector CT images of the paranasal sinuses
were obtained in a single plane without contrast.

[Series 4: limited sinus st · axial · 0.24mm/px · z∈[+127,+187]mm · 7 of 9 slices shown, 9 images]
[im 2/9  brain]
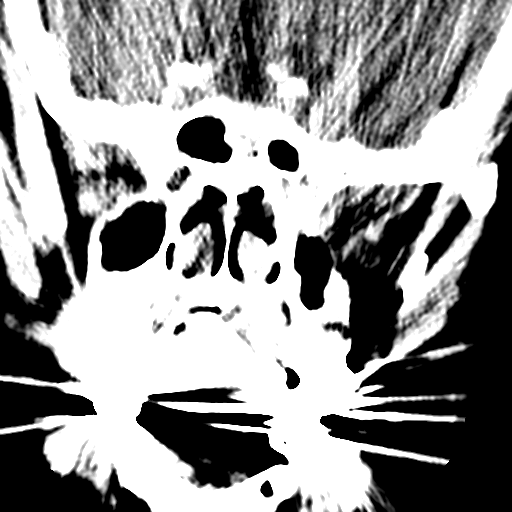
[im 2/9  bone]
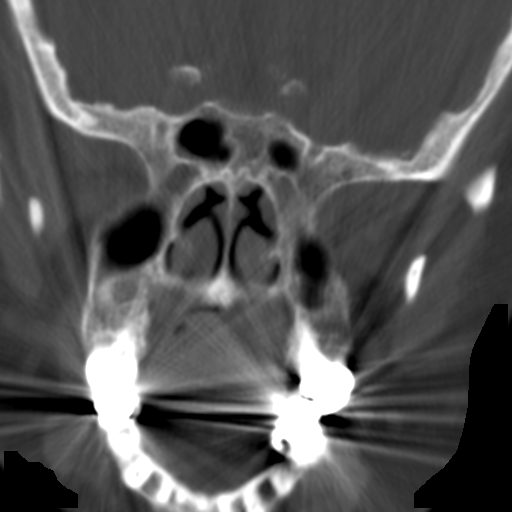
[im 3/9  bone]
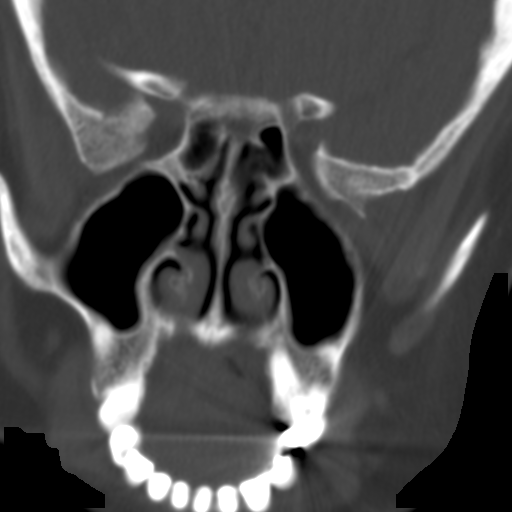
[im 4/9  bone]
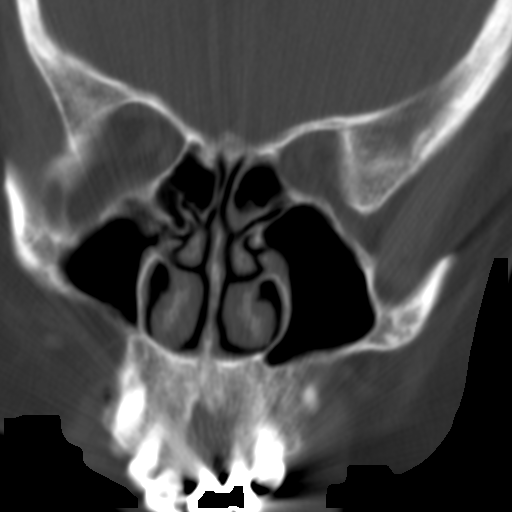
[im 5/9  bone]
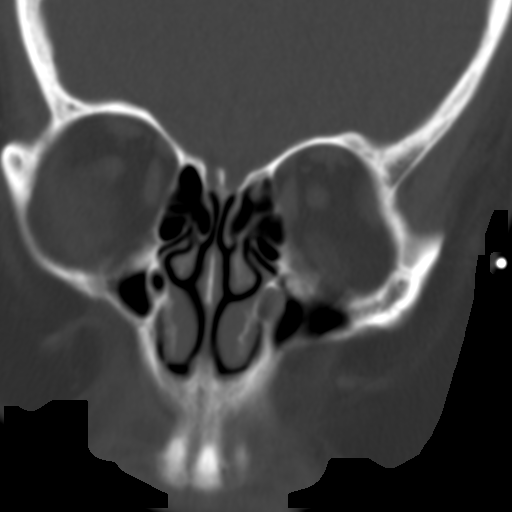
[im 6/9  brain]
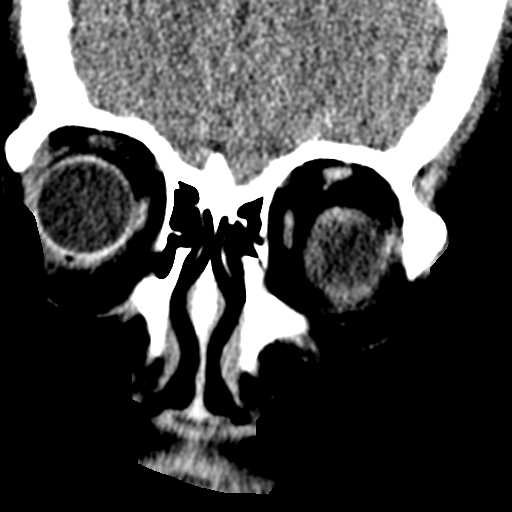
[im 6/9  bone]
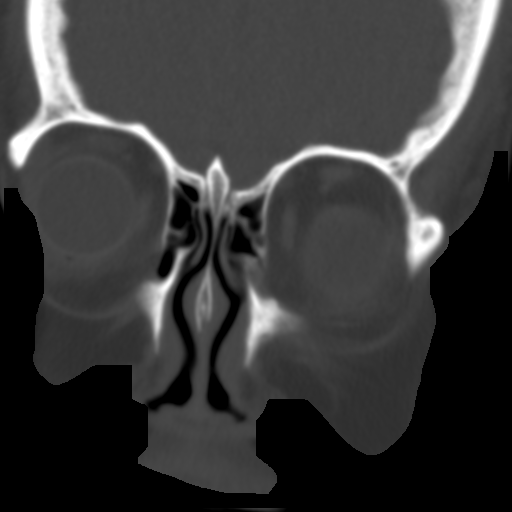
[im 7/9  bone]
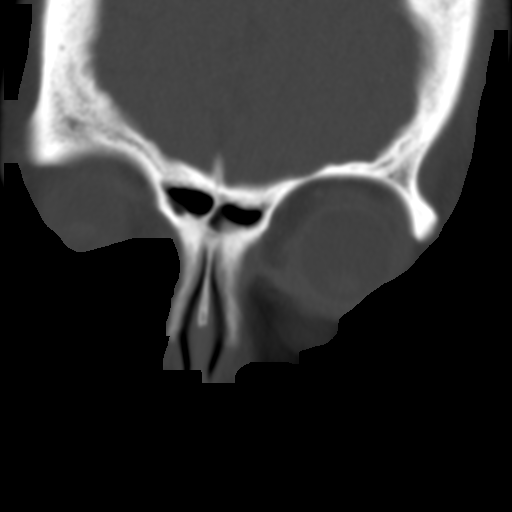
[im 8/9  bone]
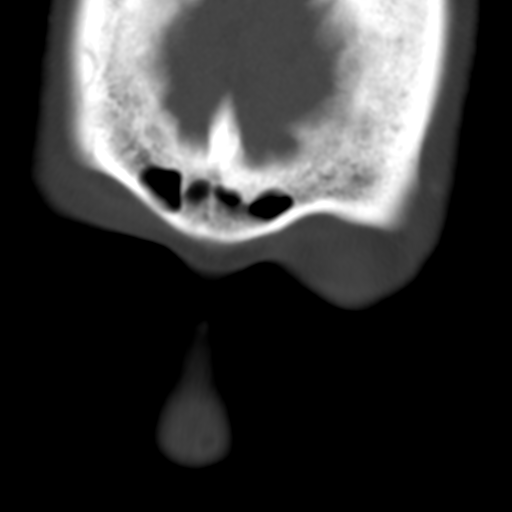

[7 of 10 positions shown; findings below may reference images not displayed]

FINDINGS: SINUSES: Trace paranasal sinus mucosal thickening. Nasal septum is
midline. No destructive bony lesions.

ORBITS: Ocular globes and orbital contents are unremarkable.

SOFT TISSUES: No significant soft tissue swelling. No subcutaneous
gas or radiopaque foreign bodies.
IMPRESSION: Trace paranasal sinus mucosal thickening.

## 2017-06-13 ENCOUNTER — Encounter: Payer: Self-pay | Admitting: Neurology

## 2017-06-14 ENCOUNTER — Ambulatory Visit (INDEPENDENT_AMBULATORY_CARE_PROVIDER_SITE_OTHER): Payer: BC Managed Care – PPO | Admitting: Neurology

## 2017-06-14 ENCOUNTER — Encounter: Payer: Self-pay | Admitting: Neurology

## 2017-06-14 VITALS — BP 138/80 | HR 70 | Ht 65.0 in | Wt 263.0 lb

## 2017-06-14 DIAGNOSIS — D860 Sarcoidosis of lung: Secondary | ICD-10-CM | POA: Diagnosis not present

## 2017-06-14 DIAGNOSIS — Z6841 Body Mass Index (BMI) 40.0 and over, adult: Secondary | ICD-10-CM | POA: Diagnosis not present

## 2017-06-14 DIAGNOSIS — R682 Dry mouth, unspecified: Secondary | ICD-10-CM

## 2017-06-14 DIAGNOSIS — E662 Morbid (severe) obesity with alveolar hypoventilation: Secondary | ICD-10-CM | POA: Diagnosis not present

## 2017-06-14 DIAGNOSIS — R0602 Shortness of breath: Secondary | ICD-10-CM | POA: Diagnosis not present

## 2017-06-14 NOTE — Progress Notes (Signed)
SLEEP MEDICINE CLINIC   Provider:  Larey Seat, M D  Primary Care Physician:  Glendale Chard, MD   Referring Provider: Glendale Chard, MD    Chief Complaint  Patient presents with  . New Patient (Initial Visit)  alone,  HPI:  Kristine [name] [name] a 66 y.o. female , seen here as in a referral  from Dr. Baird Cancer for fatigue , possible  sleep apnea evaluation.   Kristine [name] a 66 year old left-handed, married, African-American lady referred by her primary care physician Dr. Baird Cancer for evaluation of sleep disorders. The patient laughingly reports that her husband told her she snores, she has never woken up by her own snoring. She does have a history of hypertension, exacerbated by anxiety or stress. She has gained weight, she also was recently evaluated for joint pain and a positive antinuclear antibody test was obtained. She was referred to rheumatology.  Dr. Baird Cancer referred for a sleep study based on her body mass index exceeding 40, the high degree of fatigue and the reported snoring. I was able to review her last laboratory tests. She reports she [name] short  of breath, coughing, nocturnal snoring and daytime decreased energy,  a heart murmur and ankle edema-swelling in her ankles, legs as well as muscle cramps- mostly at night- improved by eating bananas. She reports she [name] short  of breath, coughing, nocturnal snoring and daytime decreased energy,  a heart murmur and ankle edema-swelling in her ankles, legs as well as muscle cramps- mostly at night- improved by eating bananas.   Sleep habits are as follows: The patient's bedtime [name] around midnight, it [name] not difficult for her to initiate sleep but to stay asleep. For the last hour before she goes to bed she usually watches TV. The bedroom [name] described as core, quiet and dark, but she often uses a tablets in bed for word puzzles or other forms of entertainment. She also states that she has a TV in the bedroom - she goes to sleep with TV ! it [name]  not set to a sleep timer. She actually retreats to the bed room as soon as 11 PM, once asleep after midnight she can't stay asleep for about 3 hours and then wakes up at 3 AM, 4 AM, 5 AM. She experiences nocturia twice at night. Her sleep [name] never as refreshing as of the first 3 hours of the night , [name] ongoingly fragmented. She has to rise for work about 7 AM. She does not feel refreshed in the morning. Over the last couple of weeks she also has experienced some dull head pain that [name] not a true headache. She does not wake up with headaches, chest pain, dizziness, nausea or palpitations. Sh rarely take naps , but feels refreshed. She can lay down and take a nap. She feels ok after nap of 30 minutes duration   Sleep medical history and family sleep history: She [name] unaware of any family members that may have had a sleep related diagnosis. The patient reports that she suffers from these early morning arousals only for the last year or so, has rare hot flushes. Pulmonary Sarcoidosis HX.  She has a lot of congestion, not helped by acid proton pump inhibitors, not related to allergies.   Social history: married, works at [employer] and [employer].   Review of Systems: Out of a complete 14 system review, the patient complains of only the following symptoms, and all other reviewed systems are negative. She reports she  [name] short  of breath, coughing, nocturnal snoring and daytime decreased energy,  a heart murmur and ankle edema-swelling in her ankles, legs as well as muscle cramps- mostly at night- improved by eating bananas.   Epworth score  16 , Fatigue severity score 41  , depression score n/a    Social History   Social History  . Marital status: Married    Spouse name: N/A  . Number of children: N/A  . Years of education: N/A   Occupational History  . Not on file.   Social History Main Topics  . Smoking status: Never Smoker  . Smokeless tobacco: Never Used  . Alcohol use Yes     Comment: occ    . Drug use: No  . Sexual activity: Not on file     Comment: HYST    Other Topics Concern  . Not on file   Social History Narrative  . No narrative on file    Family History  Problem Relation Age of Onset  . Breast cancer Mother   . Lung cancer Mother   . Lung cancer Father        smoked  . Heart disease Maternal Grandmother     Past Medical History:  Diagnosis Date  . Allergy   . Arthritis   . Glaucoma    ROD  . Hypertension   . Sarcoidosis     Past Surgical History:  Procedure Laterality Date  . ABDOMINAL HYSTERECTOMY    . BUNIONECTOMY     left  . CHOLECYSTECTOMY    . KNEE SURGERY     right  . LEFT HEART CATHETERIZATION WITH CORONARY ANGIOGRAM N/A 09/12/2014   Procedure: LEFT HEART CATHETERIZATION WITH CORONARY ANGIOGRAM;  Surgeon: Troy Sine, MD;  Location: [facility] CATH LAB;  Service: Cardiovascular;  Laterality: N/A;    Current Outpatient Prescriptions  Medication Sig Dispense Refill  . aspirin EC 81 MG tablet Take 1 tablet (81 mg total) by mouth daily.    . Brinzolamide-Brimonidine (SIMBRINZA) 1-0.2 % SUSP Apply 1 drop to eye 2 (two) times daily.    . Cholecalciferol (VITAMIN D3) 5000 UNITS CAPS Take 5,000 Units by mouth daily.    . fluticasone (FLONASE) 50 MCG/ACT nasal spray Place 2 sprays into both nostrils daily.    . metoprolol succinate (TOPROL-XL) 25 MG 24 hr tablet Take 25 mg by mouth daily.    . prednisoLONE acetate (PRED FORTE) 1 % ophthalmic suspension 1 drop every other day.    Marland Kitchen PROAIR HFA 108 (90 Base) MCG/ACT inhaler Inhale 2 puffs into the lungs every 4 (four) hours as needed.    . valsartan (DIOVAN) 160 MG tablet Take 1 tablet by mouth daily.     No current facility-administered medications for this visit.     Allergies as of 06/14/2017  . (No Known Allergies)    Vitals: BP 138/80   Pulse 70   Ht _0  (1.651 m)   Wt 263 lb (119.3 kg)   BMI 43.77 kg/m  Last Weight:  Wt Readings from Last 1 Encounters:  06/14/17 263 lb (119.3  kg)   QZE:SPQZ mass index [name] 43.77 kg/m.     Last Height:   Ht Readings from Last 1 Encounters:  06/14/17 _1  (1.651 m)    Physical exam:  General: The patient [name] awake, alert and appears not in acute distress. The patient [name] well groomed. Head: Normocephalic, atraumatic. Neck [name] supple. Mallampati 4,  neck circumference:16.5 . Nasal airflow patent ,  allergic rhinitis   . Retrognathia [name] seen.  Cardiovascular:  Regular rate and rhythm, without  murmurs or carotid bruit, and without distended neck veins. Respiratory: Lungs are clear to auscultation. Skin: Without evidence of edema, or rash Trunk: BMI [name] 44. The patient's posture [name] erect.   Neurologic exam : The patient [name] awake and alert, oriented to place and time.   Memory subjective described as intact.  Attention span & concentration ability appears normal.  Speech [name] fluent, without dysarthria, dysphonia or aphasia.  Mood and affect are appropriate.  Cranial nerves: Pupils are equal and briskly reactive to light. Extraocular movements  in vertical and horizontal planes intact and without nystagmus. Visual fields by finger perimetry are intact. Hearing to finger rub intact.  Facial sensation intact to fine touch. Facial motor strength [name] symmetric and tongue and uvula move midline. Shoulder shrug was symmetrical.  Motor exam:  Normal tone, muscle bulk and symmetric strength in all extremities. Sensory:  Fine touch, pinprick and vibration were normal. Coordination: Rapid alternating movements/Finger-to-nose maneuver  normal without evidence of ataxia, dysmetria or tremor. Gait and station: Patient walks without assistive device Strength within normal limits.  Stance [name] stable and normal.  Deep tendon reflexes: in the  upper and lower extremities are symmetric and intact.    Assessment:  After physical and neurologic examination, review of laboratory studies,  Personal review of imaging studies, reports of other /same  Imaging  studies, results of polysomnography and / or neurophysiology testing and pre-existing records as far as provided in visit., my assessment [name]   1) Mrs. Marijean Bravo reports that she has shortness of breath for several years now. She was seen by a pulmonologist, Dr. Melvyn Novas, who took her off lisinopril and for time it seems that her breathing got easier but now it has returned to the previous complaint. She [name] also carrying a diagnosis of sarcoidosis of the lung. In addition there could be an allergic bronchitis, she was diagnosed with this in January of this year and was treated. She has not used steroids in a while, for several years. If her shortness of breath [name] at night also present and if it [name] related to obstructive sleep apnea will be evaluated in the polysomnography test.  The patient's fatigue could be related to the shortness of breath, to hypoxemia, and some cases a psychological such as depression or grief can be causing fatigue. She endorsed the Epworth Sleepiness Scale at 16 points which [name] a high level and I would consider her excessively daytime sleepy. Based on her body mass index we will first pursue an apnea evaluation I will have our technologist look especially at hypoxemia at night, if apnea [name] present we will use CPAP if hypoxemia [name] the only finding will involve Dr. Melvyn Novas to see what can be done .gative for apnea.   She reports vivid dreams at an Epworth score of 16, may need to follow with MSLT if sleep study ne The patient was advised of the nature of the diagnosed disorder , the treatment options and the  risks for general health and wellness arising from not treating the condition.   I spent more than 45 minutes of face to face time with the patient.  Greater than 50% of time was spent in counseling and coordination of care. We have discussed the diagnosis and differential and I answered the patient's questions.    Plan:  Treatment plan and additional workup :  1) snoring, fatigue SPLIT  night  PSG< split at AHI 20. Will discuss modafinil in revisit.  2) capnography and hypoxemia montoring. Morbid obesity.  3) likely needs CPAP   RV after PSG>   Larey Seat, MD 0/97/3532, 9:92 PM  Certified in Neurology by ABPN Certified in [place] by [facility] [address], [place], [place] [address]

## 2017-06-30 ENCOUNTER — Ambulatory Visit (INDEPENDENT_AMBULATORY_CARE_PROVIDER_SITE_OTHER): Payer: BC Managed Care – PPO | Admitting: Neurology

## 2017-06-30 DIAGNOSIS — G4733 Obstructive sleep apnea (adult) (pediatric): Secondary | ICD-10-CM

## 2017-06-30 DIAGNOSIS — E662 Morbid (severe) obesity with alveolar hypoventilation: Secondary | ICD-10-CM

## 2017-06-30 DIAGNOSIS — D860 Sarcoidosis of lung: Secondary | ICD-10-CM

## 2017-06-30 DIAGNOSIS — Z6841 Body Mass Index (BMI) 40.0 and over, adult: Secondary | ICD-10-CM

## 2017-06-30 DIAGNOSIS — R0602 Shortness of breath: Secondary | ICD-10-CM

## 2017-07-04 NOTE — Progress Notes (Signed)
Office Visit Note  Patient: Kristine [name]             Date of Birth: 1951/04/08           MRN: [id number]             PCP: Glendale Chard, MD Referring: Glendale Chard, MD Visit Date: 07/06/2017 Occupation: Benefit specialist at [employer]     Subjective:  Pain in multiple joints.   History of Present Illness: Kristine [name] is a 66 y.o. female seen in consultation per request of her PCP. According to patient in 1998 she had a physical by Dr. Renold Genta at the time her chest x-ray revealed lymph nodes. She was diagnosed with sarcoidosis and was referred to Dr. Melvyn Novas. She's been under care of Dr. Melvyn Novas for multiple years. She states she was treated with prednisone when necessary excess or patient of sarcoidosis. She states she has not had any flares in the last 2 years and the last dose of prednisone was 2 years ago. She was never treated with DMARD's. She had recent episode of increased shortness of breath for which she was evaluated by Dr. Melvyn Novas. According to patient he felt it was not due to sarcoidosis area and she has an inhaler which she uses for airway irritation. She's also had uveitis for last 5-6 years for which she's been seeing Dr. Kathlen Mody area and she's been treated with steroid eyedrops and recently she had elevation of intraocular pressure for which she's using eyedrops. Due to formation of early cataracts she has is paced her steroids eye drops. She has had long-standing problems with osteoarthritis in her knee joints and had arthroscopic surgery on her right knee joint in the past. She continues to have pain and discomfort in her knee joints. She also gives history of bilateral ankle joint pain and swelling. She had right bunionectomy and foot reconstruction in the past. She's been having increased pain in her hands for over the last 2 months without any swelling.   Activities of Daily Living:  Patient reports morning stiffness for 10 minutes.   Patient Reports nocturnal pain.    Difficulty dressing/grooming: Denies Difficulty climbing stairs: Reports Difficulty getting out of chair: Reports Difficulty using hands for taps, buttons, cutlery, and/or writing: Denies   Review of Systems  Constitutional: Positive for fatigue. Negative for night sweats, weight gain, weight loss and weakness.  HENT: Positive for mouth dryness. Negative for mouth sores, trouble swallowing, trouble swallowing and nose dryness.   Eyes: Positive for redness. Negative for pain, visual disturbance and dryness.  Respiratory: Positive for cough and shortness of breath. Negative for difficulty breathing.   Cardiovascular: Positive for hypertension. Negative for chest pain, palpitations, irregular heartbeat and swelling in legs/feet.  Gastrointestinal: Negative for blood in stool, constipation and diarrhea.  Endocrine: Negative for increased urination.  Genitourinary: Negative for vaginal dryness.  Musculoskeletal: Positive for arthralgias, joint pain, joint swelling and morning stiffness. Negative for myalgias, muscle weakness, muscle tenderness and myalgias.  Skin: Negative for color change, rash, hair loss, skin tightness, ulcers and sensitivity to sunlight.  Allergic/Immunologic: Negative for susceptible to infections.  Neurological: Negative for dizziness, memory loss and night sweats.  Hematological: Negative for swollen glands.  Psychiatric/Behavioral: Positive for sleep disturbance. Negative for depressed mood. The patient is nervous/anxious.     PMFS History:  Patient Active Problem List   Diagnosis Date Noted  . Upper airway cough syndrome 12/23/2016  . Chest pain, atypical 08/21/2014  . Dyspnea 08/21/2014  .  Vitamin D deficiency 05/13/2013  . Spinal stenosis of lumbar region 05/13/2013  . Elevated LDL cholesterol level 05/13/2013  . Metabolic syndrome 78/58/8502  . Impaired glucose tolerance 04/14/2013  . 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 Morbid (severe) obesity due to  excess calories ([place]) 11/11/2011  . Sarcoidosis ([place]) 09/30/2011  . Essential hypertension 09/30/2011  . Osteoarthritis 09/30/2011  . GERD (gastroesophageal reflux disease) 09/30/2011  . Asthma 09/30/2011  . Goiter 09/30/2011    Past Medical History:  Diagnosis Date  . Allergy   . Arthritis   . Glaucoma    ROD  . Hypertension   . Sarcoidosis     Family History  Problem Relation Age of Onset  . Breast cancer Mother   . Lung cancer Mother   . Lung cancer Father        smoked  . Heart disease Maternal Grandmother    Past Surgical History:  Procedure Laterality Date  . ABDOMINAL HYSTERECTOMY    . BUNIONECTOMY     left  . CHOLECYSTECTOMY    . KNEE SURGERY     right  . LEFT HEART CATHETERIZATION WITH CORONARY ANGIOGRAM N/A 09/12/2014   Procedure: LEFT HEART CATHETERIZATION WITH CORONARY ANGIOGRAM;  Surgeon: Troy Sine, MD;  Location: [facility] CATH LAB;  Service: Cardiovascular;  Laterality: N/A;   Social History   Social History Narrative  . No narrative on file     Objective: Vital Signs: BP 140/82   Pulse 78   Ht _0  (1.676 m)   Wt 266 lb (120.7 kg)   BMI 42.93 kg/m    Physical Exam  Constitutional: She is oriented to person, place, and time. She appears well-developed and well-nourished.  HENT:  Head: Normocephalic and atraumatic.  Eyes: EOM are normal.  Bilateral conjunctival injection  Neck: Normal range of motion.  Cardiovascular: Normal rate, regular rhythm, normal heart sounds and intact distal pulses.   Pulmonary/Chest: Effort normal and breath sounds normal.  Abdominal: Soft. Bowel sounds are normal.  Lymphadenopathy:    She has no cervical adenopathy.  Neurological: She is alert and oriented to person, place, and time.  Skin: Skin is warm and dry. Capillary refill takes less than 2 seconds.  Psychiatric: She has a normal mood and affect. Her behavior is normal.  Nursing note and vitals reviewed.    Musculoskeletal Exam: C-spine and thoracic  lumbar spine good range of motion. Shoulder joints elbow joints wrist joints are good range of motion. She is some tenderness across her MCP joints but no synovitis was noted. No PIP/DIP thickening was noted. Hip joints knee joints are good range of motion. She has some crepitus with range of motion of bilateral knee joints and discomfort. She has tenderness and swelling over bilateral ankle joints. She is postsurgical changes in her feet without any active synovitis. She is tenderness across her MTP joints. Kohut into that is a good idea thank you she had TSH lately and that it ANA which was positive and RNP was positive  CDAI Exam: No CDAI exam completed.    Investigation: Findings:  05/02/2017 TSH normal, B12 normal, ANA positive, ENA positive for RNP (negative for dsDNA, SSA, SSB, Smith)    Imaging: Xr Ankle Complete Left  Result Date: 07/06/2017 No tibiotalar joint space narrowing was noted. No erosive changes were noted.  Xr Ankle Complete Right  Result Date: 07/06/2017 No significant ankle joint narrowing was noted. Some cystic versus erosive changes were noted on the medial part of tibia.  Xr Foot 2 Views Left  Result Date: 07/06/2017 First MTP narrowing and post bunionectomy changes were noted. All PIP/DIP narrowing was noted. Postsurgical change in the fifth phalanx was noted. Dorsal spurring was noted. Inferior and posterior calcaneal spurs were noted. Impression: These findings are consistent with osteoarthritis of the foot and postsurgical changes.  Xr Foot 2 Views Right  Result Date: 07/06/2017 Postsurgical changes in the right first MTP noted. Her pain was noted in the first MTP. PIP/DIP narrowing was noted. No erosive changes were noted. Postsurgical changes in the fourth and fifth phalanx were noted. Dorsal spurring and inferior and posterior calcaneal spurs were noted. Impression: These findings were consistent with osteoarthritis of the foot.  Xr Hand 2 View  Left  Result Date: 07/06/2017 PIP/DIP narrowing was noted. CMC narrowing was noted. No MCP or intercarpal joint space narrowing was noted. No erosive changes were noted. Impression: These findings are consistent with mild osteoarthritis of the hand.  Xr Hand 2 View Right  Result Date: 07/06/2017 PIP/DIP narrowing was noted. CMC narrowing was noted. No MCP or intercarpal joint space narrowing was noted. No erosive changes were noted. Impression: These findings are consistent with mild osteoarthritis of the hand.  Xr Knee 3 View Left  Result Date: 07/06/2017 Moderate to severe lateral compartment narrowing with lateral osteophytes was noted. No chondrocalcinosis was noted. Moderate patellofemoral narrowing was noted. 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 No chondrocalcinosis was noted. Moderate patellofemoral narrowing was noted. 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   Speciality Comments: No specialty comments available.    Procedures:  No procedures performed Allergies: Patient has no known allergies.   Assessment / Plan:     Visit Diagnoses: Positive ANA (antinuclear antibody) - RNP positive,(dsDNA, anti-SSA, anti-SSB, Smith negative) she does not have ANA titer available and we'll obtain that today and will do labs which are listed below.   Pain in both hands. Patient complains of ongoing hand pain and intermittent swelling her do not see any synovitis on examination which she has some tenderness. - Plan: XR Hand 2 View Right, XR Hand 2 View Left. X-rays revealed mild osteoarthritis of the hands  Chronic pain of both knees. She gives history of frequent swelling of her knee joints and it feels some warmth in her  knee joints but no swelling was noted. -  status post right knee joint arthroscopic surgery - Plan: XR KNEE 3 VIEW LEFT, XR KNEE 3 VIEW RIGHT. X-rays reveal severe osteoarthritis of bilateral knee joints with lateral compartment narrowing. And severe chondromalacia patella  Chronic pain of both ankles. Her ankles appears swollen and were tender to palpate. - Plan: XR Ankle Complete Left, XR Ankle Complete Right. X-rays were unremarkable  Pain in both feet she has chronic pain in her bilateral feet and had some surgery has described below. -Bilateral bunionectomy and reconstruction of right foot - Plan: XR Foot 2 Views Left, XR Foot 2 Views Right. X-ray revealed osteoarthritic changes and postsurgical changes.  Sarcoidosis ([place]) - Hilar lymphadenopathy on chest x-ray. Followed up by Dr. Melvyn Novas. Patient has been treated with prednisone for multiple years and then intermittent use of prednisone. She denies any use of prednisone for the last 2 years. She has been experiencing some shortness of breath which Dr.  Wert per patient felt was related to hyperreactive airway disease. She's been using an inhaler for that.. Detailed discussion regarding treatment of sarcoidosis. The she's had lung involvement, recurrent uveitis and arthralgias. Indications side effects contraindications of different medication options were discussed. A handout on methotrexate was given consent was obtained. The plan is to start her on methotrexate after the labs are available. We will start her on 4 tablets by mouth every week for 2 weeks and increase it to 6 tablets by mouth every week for 2 weeks and then increase it to 8 tablets by mouth every week if the labs are stable. She will need folic acid 2 mg by mouth daily. She will need labs which will include CBC and CMP with GFR every 2 weeks 3 and then every 2 months to monitor for drug toxicity. 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 She had mild conjunctival injection in bilateral eyes.  DDD (degenerative disc disease), lumbar - MRI 2003. She does not have much lower back discomfort.   Elevated LDL cholesterol level  Essential hypertension: Her blood pressure is still elevated.  Gastroesophageal reflux disease, esophagitis presence not specified  Goiter  Morbid (severe) obesity due to excess calories ([place]): Weight loss diet and exercise was discussed.  Upper airway cough syndrome - due to GER per Dr. Melvyn Novas  Vitamin D deficiency    Orders: Orders Placed This Encounter  Procedures  . XR Hand 2 View Right  . XR Hand 2 View Left  . XR Foot 2 Views Left  . XR Foot 2 Views Right  . XR Ankle Complete Left  . XR Ankle Complete Right  . XR KNEE 3 VIEW LEFT  . XR KNEE 3 VIEW RIGHT  . CBC with Differential/Platelet  . COMPLETE METABOLIC PANEL WITH GFR  . Sedimentation rate  . Urinalysis, Routine w reflex microscopic  . Antinuclear Antib (ANA)  . Rheumatoid Factor  . Cyclic citrul peptide antibody, IgG  . Serum protein electrophoresis with reflex  . IgG, IgA, IgM  . Quantiferon tb gold assay (blood)  . Hepatitis panel, acute  . HIV antibody  . CK  . Anti-scleroderma antibody  . Lupus anticoagulant panel  . Beta-2 glycoprotein antibodies  . Cardiolipin antibodies, IgG, IgM, IgA  . CBC with Differential/Platelet  . COMPLETE METABOLIC PANEL WITH GFR   No orders of the defined types were placed in this encounter.   Face-to-face time spent with patient was 60 minutes. Greater than 50% of time was spent in counseling and coordination of care.  Follow-Up Instructions: Return for Sarcoidosis, Osteoarthritis.   Bo Merino, MD  Note - This record has been created using Editor, commissioning.  Chart creation errors have been sought, but may not always  have been located. Such creation errors do not reflect on  the standard of medical care.

## 2017-07-06 ENCOUNTER — Ambulatory Visit (INDEPENDENT_AMBULATORY_CARE_PROVIDER_SITE_OTHER): Payer: Self-pay

## 2017-07-06 ENCOUNTER — Encounter: Payer: Self-pay | Admitting: Rheumatology

## 2017-07-06 ENCOUNTER — Ambulatory Visit (INDEPENDENT_AMBULATORY_CARE_PROVIDER_SITE_OTHER): Payer: BC Managed Care – PPO | Admitting: Rheumatology

## 2017-07-06 ENCOUNTER — Telehealth: Payer: Self-pay | Admitting: Radiology

## 2017-07-06 VITALS — BP 140/82 | HR 78 | Ht 66.0 in | Wt 266.0 lb

## 2017-07-06 DIAGNOSIS — Z1159 Encounter for screening for other viral diseases: Secondary | ICD-10-CM

## 2017-07-06 DIAGNOSIS — M79672 Pain in left foot: Secondary | ICD-10-CM | POA: Diagnosis not present

## 2017-07-06 DIAGNOSIS — M79641 Pain in right hand: Secondary | ICD-10-CM

## 2017-07-06 DIAGNOSIS — M51369 Other intervertebral disc degeneration, lumbar region without mention of lumbar back pain or lower extremity pain: Secondary | ICD-10-CM

## 2017-07-06 DIAGNOSIS — R768 Other specified abnormal immunological findings in serum: Secondary | ICD-10-CM | POA: Diagnosis not present

## 2017-07-06 DIAGNOSIS — I10 Essential (primary) hypertension: Secondary | ICD-10-CM | POA: Diagnosis not present

## 2017-07-06 DIAGNOSIS — Z79899 Other long term (current) drug therapy: Secondary | ICD-10-CM

## 2017-07-06 DIAGNOSIS — K219 Gastro-esophageal reflux disease without esophagitis: Secondary | ICD-10-CM

## 2017-07-06 DIAGNOSIS — Z111 Encounter for screening for respiratory tuberculosis: Secondary | ICD-10-CM

## 2017-07-06 DIAGNOSIS — E049 Nontoxic goiter, unspecified: Secondary | ICD-10-CM

## 2017-07-06 DIAGNOSIS — H3093 Unspecified chorioretinal inflammation, bilateral: Secondary | ICD-10-CM | POA: Diagnosis not present

## 2017-07-06 DIAGNOSIS — E559 Vitamin D deficiency, unspecified: Secondary | ICD-10-CM

## 2017-07-06 DIAGNOSIS — R05 Cough: Secondary | ICD-10-CM

## 2017-07-06 DIAGNOSIS — E7800 Pure hypercholesterolemia, unspecified: Secondary | ICD-10-CM | POA: Diagnosis not present

## 2017-07-06 DIAGNOSIS — M79642 Pain in left hand: Secondary | ICD-10-CM

## 2017-07-06 DIAGNOSIS — M25561 Pain in right knee: Secondary | ICD-10-CM | POA: Diagnosis not present

## 2017-07-06 DIAGNOSIS — M25572 Pain in left ankle and joints of left foot: Secondary | ICD-10-CM | POA: Diagnosis not present

## 2017-07-06 DIAGNOSIS — G8929 Other chronic pain: Secondary | ICD-10-CM

## 2017-07-06 DIAGNOSIS — M25562 Pain in left knee: Secondary | ICD-10-CM | POA: Diagnosis not present

## 2017-07-06 DIAGNOSIS — M25571 Pain in right ankle and joints of right foot: Secondary | ICD-10-CM

## 2017-07-06 DIAGNOSIS — M79671 Pain in right foot: Secondary | ICD-10-CM

## 2017-07-06 DIAGNOSIS — M5136 Other intervertebral disc degeneration, lumbar region: Secondary | ICD-10-CM | POA: Diagnosis not present

## 2017-07-06 DIAGNOSIS — Z114 Encounter for screening for human immunodeficiency virus [HIV]: Secondary | ICD-10-CM

## 2017-07-06 DIAGNOSIS — D869 Sarcoidosis, unspecified: Secondary | ICD-10-CM | POA: Diagnosis not present

## 2017-07-06 DIAGNOSIS — M2550 Pain in unspecified joint: Secondary | ICD-10-CM

## 2017-07-06 DIAGNOSIS — R058 Other specified cough: Secondary | ICD-10-CM

## 2017-07-06 LAB — CBC WITH DIFFERENTIAL/PLATELET
BASOS ABS: 62 {cells}/uL (ref 0–200)
BASOS PCT: 1 %
EOS ABS: 186 {cells}/uL (ref 15–500)
Eosinophils Relative: 3 %
HEMATOCRIT: 38.7 % (ref 35.0–45.0)
HEMOGLOBIN: 12.4 g/dL (ref 11.7–15.5)
LYMPHS ABS: 1674 {cells}/uL (ref 850–3900)
Lymphocytes Relative: 27 %
MCH: 25.7 pg — AB (ref 27.0–33.0)
MCHC: 32 g/dL (ref 32.0–36.0)
MCV: 80.1 fL (ref 80.0–100.0)
MONO ABS: 372 {cells}/uL (ref 200–950)
MPV: 9.8 fL (ref 7.5–12.5)
Monocytes Relative: 6 %
NEUTROS ABS: 3906 {cells}/uL (ref 1500–7800)
Neutrophils Relative %: 63 %
PLATELETS: 273 10*3/uL (ref 140–400)
RBC: 4.83 MIL/uL (ref 3.80–5.10)
RDW: 14.8 % (ref 11.0–15.0)
WBC: 6.2 10*3/uL (ref 3.8–10.8)

## 2017-07-06 NOTE — Telephone Encounter (Signed)
Thanks. Sticky note made it patient's chart!

## 2017-07-06 NOTE — Patient Instructions (Addendum)
Once your baseline labs return, we plan to start you on 4 tablets by mouth weekly for two weeks, then get labs.  If labs are stable, will plan to increase methotrexate to 6 tablets by mouth weekly for two weeks, then get labs.  If stable, will plan to increase methotrexate to 8 tablets by mouth weekly.  Get labs 2 weeks after increasing the methotrexate dose.  If stable, will continue methotrexate 8 tablets weekly.    Start taking folic acid 2 mg by mouth daily  Standing Labs We placed an order today for your standing lab work.    Please come back and get your standing labs in 2 weeks x 3, then every 2 months  We have open lab Monday through Friday from 8:30-11:30 AM and 1:30-4 PM at the office of Dr. Tresa Moore, PA.   The office is located at [address] [place], [place], [place] [address] No appointment is necessary.   Labs are drawn by [facility].  You may receive a bill from [facility] for your lab work.    We recommend a yearly influenza vaccine.  We also recommend getting a pneumonia vaccine and the Shingrix vaccine.  Please discuss this with your primary care provider.     Methotrexate tablets What is this medicine? METHOTREXATE (METH oh TREX ate) is a chemotherapy drug used to treat cancer including breast cancer, leukemia, and lymphoma. This medicine can also be used to treat psoriasis and certain kinds of arthritis. This medicine may be used for other purposes; ask your health care provider or pharmacist if you have questions. COMMON BRAND NAME(S): Rheumatrex, Trexall What should I tell my health care provider before I take this medicine? They need to know if you have any of these conditions: -fluid in the stomach area or lungs -if you often drink alcohol -infection or immune system problems -kidney disease or on hemodialysis -liver disease -low blood counts, like low white cell, platelet, or red cell counts -lung disease -radiation therapy -stomach  ulcers -ulcerative colitis -an unusual or allergic reaction to methotrexate, other medicines, foods, dyes, or preservatives -pregnant or trying to get pregnant -breast-feeding How should I use this medicine? Take this medicine by mouth with a glass of water. Follow the directions on the prescription label. Take your medicine at regular intervals. Do not take it more often than directed. Do not stop taking except on your doctor's advice. Make sure you know why you are taking this medicine and how often you should take it. If this medicine is used for a condition that is not cancer, like arthritis or psoriasis, it should be taken weekly, NOT daily. Taking this medicine more often than directed can cause serious side effects, even death. Talk to your healthcare provider about safe handling and disposal of this medicine. You may need to take special precautions. Talk to your pediatrician regarding the use of this medicine in children. While this drug may be prescribed for selected conditions, precautions do apply. Overdosage: If you think you have taken too much of this medicine contact a poison control center or emergency room at once. NOTE: This medicine is only for you. Do not share this medicine with others. What if I miss a dose? If you miss a dose, talk with your doctor or health care professional. Do not take double or extra doses. What may interact with this medicine? This medicine may interact with the following medication: -acitretin -aspirin and aspirin-like medicines including salicylates -azathioprine -certain antibiotics like  penicillins, tetracycline, and chloramphenicol -cyclosporine -gold -hydroxychloroquine -live virus vaccines -NSAIDs, medicines for pain and inflammation, like ibuprofen or naproxen -other cytotoxic agents -penicillamine -phenylbutazone -phenytoin -probenecid -retinoids such as isotretinoin and tretinoin -steroid medicines like prednisone or  cortisone -sulfonamides like sulfasalazine and trimethoprim/sulfamethoxazole -theophylline This list may not describe all possible interactions. Give your health care provider a list of all the medicines, herbs, non-prescription drugs, or dietary supplements you use. Also tell them if you smoke, drink alcohol, or use illegal drugs. Some items may interact with your medicine. What should I watch for while using this medicine? Avoid alcoholic drinks. This medicine can make you more sensitive to the sun. Keep out of the sun. If you cannot avoid being in the sun, wear protective clothing and use sunscreen. Do not use sun lamps or tanning beds/booths. You may need blood work done while you are taking this medicine. Call your doctor or health care professional for advice if you get a fever, chills or sore throat, or other symptoms of a cold or flu. Do not treat yourself. This drug decreases your body's ability to fight infections. Try to avoid being around people who are sick. This medicine may increase your risk to bruise or bleed. Call your doctor or health care professional if you notice any unusual bleeding. Check with your doctor or health care professional if you get an attack of severe diarrhea, nausea and vomiting, or if you sweat a lot. The loss of too much body fluid can make it dangerous for you to take this medicine. Talk to your doctor about your risk of cancer. You may be more at risk for certain types of cancers if you take this medicine. Both men and women must use effective birth control with this medicine. Do not become pregnant while taking this medicine or until at least 1 normal menstrual cycle has occurred after stopping it. Women should inform their doctor if they wish to become pregnant or think they might be pregnant. Men should not father a child while taking this medicine and for 3 months after stopping it. There is a potential for serious side effects to an unborn child. Talk to  your health care professional or pharmacist for more information. Do not breast-feed an infant while taking this medicine. What side effects may I notice from receiving this medicine? Side effects that you should report to your doctor or health care professional as soon as possible: -allergic reactions like skin rash, itching or hives, swelling of the face, lips, or tongue -breathing problems or shortness of breath -diarrhea -dry, nonproductive cough -low blood counts - this medicine may decrease the number of white blood cells, red blood cells and platelets. You may be at increased risk for infections and bleeding. -mouth sores -redness, blistering, peeling or loosening of the skin, including inside the mouth -signs of infection - fever or chills, cough, sore throat, pain or trouble passing urine -signs and symptoms of bleeding such as bloody or black, tarry stools; red or dark-brown urine; spitting up blood or brown material that looks like coffee grounds; red spots on the skin; unusual bruising or bleeding from the eye, gums, or nose -signs and symptoms of kidney injury like trouble passing urine or change in the amount of urine -signs and symptoms of liver injury like dark yellow or brown urine; general ill feeling or flu-like symptoms; light-colored stools; loss of appetite; nausea; right upper belly pain; unusually weak or tired; yellowing of the eyes or skin Side effects  that usually do not require medical attention (report to your doctor or health care professional if they continue or are bothersome): -dizziness -hair loss -tiredness -upset stomach -vomiting This list may not describe all possible side effects. Call your doctor for medical advice about side effects. You may report side effects to FDA at 1-800-FDA-1088. Where should I keep my medicine? Keep out of the reach of children. Store at room temperature between 20 and 25 degrees C (68 and 77 degrees F). Protect from light.  Throw away any unused medicine after the expiration date. NOTE: This sheet is a summary. It may not cover all possible information. If you have questions about this medicine, talk to your doctor, pharmacist, or health care provider.  2018 Elsevier/Gold Standard (2015-07-07 05:39:22)

## 2017-07-06 NOTE — Addendum Note (Signed)
Addended by: Larey Seat on: 07/06/2017 06:50 PM   Modules accepted: Orders

## 2017-07-06 NOTE — Telephone Encounter (Signed)
If labs normal, plan is to start patient on Methotrexate and Folic Acid,  Dosing instructions are in the patients note from today

## 2017-07-06 NOTE — Procedures (Signed)
PATIENT'S NAME:  Kristine [name], Para DOB:      07/18/1951      MR#:    [id number]     DATE OF RECORDING: 06/30/2017 by Gaylyn Cheers REFERRING M.D.:  Glendale Chard, MD Study Performed:  Split-Night Titration Study HISTORY:  Kristine [name] a 66 year old African-American female patient who laughingly reports that her husband told her she snores, but she has never woken up by her own snoring. She is excessively daytime sleepy, does have a history of hypertension, has gained weight over the last 36 month, she also was recently evaluated for joint pain and a positive antinuclear antibody test was obtained.  The patient endorsed the Epworth Sleepiness Scale at 16 points. FSS at 41 points.  The patient's weight 263 pounds with a height of 65 (inches), resulting in a BMI of 43.7 kg/m2. The patient's neck circumference measured 16.5 inches.  CURRENT MEDICATIONS: Aspirin, Simbrinza, Vit D3, Flonase, Toprol-XL, Predforte, ProAir HFA 108, Diovan    PROCEDURE:  This is a multichannel digital polysomnogram utilizing the SomnoStar 11.2 system.  Electrodes and sensors were applied and monitored per AASM Specifications.   EEG, EOG, Chin and Limb EMG, were sampled at 200 Hz.  ECG, Snore and Nasal Pressure, Thermal Airflow, Respiratory Effort, CPAP Flow and Pressure, Oximetry was sampled at 50 Hz. Digital video and audio were recorded.      BASELINE STUDY WITHOUT CPAP RESULTS:  Lights Out was at 22:17 and Lights On at 05:04.  Total recording time (TRT) was 139.5, with a total sleep time (TST) of 126 minutes.   The patient's sleep latency was 1.5 minutes.  REM latency was 85.5 minutes.  The sleep efficiency was 90.3 %.    SLEEP ARCHITECTURE: WASO (Wake after sleep onset) was 1 minutes, Stage N1 was 2.5 minutes, Stage N2 was 104 minutes, Stage N3 was 0 minutes and Stage R (REM sleep) was 19.5 minutes.  The percentages were Stage N1 2.%, Stage N2 82.5%, Stage N3 0% and Stage R (REM sleep) 15.5%.   RESPIRATORY ANALYSIS:   There were a total of 82 respiratory events:  25 obstructive apneas, 2 central apneas and 2 mixed apneas with 53 hypopneas. The patient also had 0 respiratory event related arousals (RERAs). Loud Snoring was noted. The total APNEA/HYPOPNEA INDEX (AHI) was 39.1 /hour and the total RESPIRATORY DISTURBANCE INDEX was 39.1/hour.  24 events occurred in REM sleep and 92 events in NREM.  The REM AHI was 73.8, /hour versus a non-REM AHI of 32.7 /hour. The patient spent 343 minutes sleep time in the supine position 2 minutes in non-supine.  The supine AHI was 39.0 /hour versus a non-supine AHI of 0.0 /hour.  OXYGEN SATURATION & C02:  The wake baseline 02 saturation was 94%, with the lowest being 76%. Time spent below 89% saturation equaled 51 minutes.   PERIODIC LIMB MOVEMENTS:    The patient had a total of 0 Periodic Limb Movements.  The arousals were noted as: 28 were spontaneous, 0 were associated with PLMs, and 47 were associated with respiratory events. Audio and video analysis did not show any abnormal or unusual movements, behaviors, phonations or vocalizations. The patient took bathroom breaks EKG was in keeping with sinus rhythm (NSR)  TITRATION STUDY WITH CPAP RESULTS:   CPAP was initiated at 6 cmH20 with heated humidity per AASM split night standards and pressure was advanced to 9 cmH20 because of hypopneas, apneas and desaturations.  At a PAP pressure of 9 cmH20, there was a reduction of the  AHI to 0.0 /hour and the sleep efficiency was 98.4%, over 119 minutes.    Total recording time (TRT) was 267.5 minutes, with a total sleep time (TST) of 218.5 minutes. The patient's sleep latency was 57.5 minutes. REM latency was 89.5 minutes.  The sleep efficiency was 81.7 %.   Wake after sleep was 28 minutes, Stage N1 9.5 minutes, Stage N2 153 minutes, Stage N3 11.5 minutes and Stage R (REM sleep) 44.5 minutes. The percentages were: Stage N1 4.3%, Stage N2 70.%, Stage N3 5.3% and Stage R (REM sleep) 20.4%.    RESPIRATORY ANALYSIS:  There were a total of 3 respiratory events: 1 mixed apnea with 2 hypopneas with 0 respiratory event related arousals (RERAs).     The total APNEA/HYPOPNEA INDEX  (AHI) was 0.8 /hour and the total RESPIRATORY DISTURBANCE INDEX was 0.8 /hour.  2 events occurred in REM sleep and 1 events in NREM. The REM AHI was 2.7 /hour versus a non-REM AHI of 0.3 /hour. 0 The patient spent 99% of total sleep time in the supine position. The supine AHI was 0.9 /hour, versus a non-supine AHI of 0.0/hour.  OXYGEN SATURATION & C02:  The wake baseline 02 saturation was 94%, with the lowest being 84%. Time spent below 89% saturation equaled 12 minutes.  PERIODIC LIMB MOVEMENTS:    The patient had a total of 11 Periodic Limb Movements. The Periodic Limb Movement (PLM) index was 3. /hour and the PLM Arousal index was 0.8 /hour. The arousals were noted as: 34 were spontaneous, 3 were associated with PLMs, and 0 were associated with respiratory events. Post-study, the patient indicated that sleep was better than usual.  POLYSOMNOGRAPHY IMPRESSION :   1. Severe, REM accentuated, Obstructive Sleep Apnea(OSA)  2. Loud Primary Snoring  RECOMMENDATIONS:                    Advise to start auto-titration capable CPAP at 10 cm water pressure with 2 cm EPR and use of a small sized ESON nasal mask/  RAMP of 5 minutes , starting at 6 cm water. 1. Further information regarding OSA may be obtained from [employer] (www.sleepfoundation.org) or American Sleep Apnea Association (www.sleepapnea.org). 2. Reduce BMI by diet, exercise and wellness program, consider Medical Weight Management.   3. A follow up appointment will be scheduled in the Sleep Clinic at [facility].      I certify that I have reviewed the entire raw data recording prior to the issuance of this report in accordance with the Standards of Accreditation of the American Academy of Sleep Medicine  (AASM)      Larey Seat, M.D.  07-06-2017  Diplomat, American Board of Psychiatry and Neurology  Diplomat, [place] of Sleep Medicine Medical Director, [place] [facility]

## 2017-07-06 NOTE — Progress Notes (Signed)
Pharmacy Note  Subjective: Patient presents today to the [facility] to see Dr. Estanislado Pandy.  I was asked to see the patient for counseling on methotrexate.    Objective: CBC    Component Value Date/Time   WBC 8.6 07/30/2016 1623   RBC 4.73 07/30/2016 1623   HGB 12.2 07/30/2016 1623   HCT 36.6 07/30/2016 1623   PLT 271.0 07/30/2016 1623   MCV 77.3 (L) 07/30/2016 1623   MCH 26.0 04/05/2013 0858   MCHC 33.5 07/30/2016 1623   RDW 14.4 07/30/2016 1623   LYMPHSABS 1.7 07/30/2016 1623   MONOABS 0.6 07/30/2016 1623   EOSABS 0.2 07/30/2016 1623   BASOSABS 0.0 07/30/2016 1623   CMP     Component Value Date/Time   NA 141 09/11/2014 1229   K 3.5 09/11/2014 1229   CL 110 09/11/2014 1229   CO2 19 09/11/2014 1229   GLUCOSE 97 09/11/2014 1229   BUN 15 09/11/2014 1229   CREATININE 0.9 09/11/2014 1229   CREATININE 0.93 07/11/2014 0918   CALCIUM 9.0 09/11/2014 1229   PROT 6.9 04/05/2013 0858   ALBUMIN 4.0 04/05/2013 0858   AST 14 04/05/2013 0858   ALT 14 04/05/2013 0858   ALKPHOS 53 04/05/2013 0858   BILITOT 0.3 04/05/2013 0858   GFRNONAA 74 (L) 09/28/2011 1456   GFRAA 86 (L) 09/28/2011 1456   Updated CBC / CMP ordered today  TB Gold: ordered today Hepatitis panel: ordered today  Chest-xray:  12/23/16 "IMPRESSION: No evidence of pneumonia nor CHF. Stable bilateral hilar lymph node enlargement."  Contraception: hysterectomy  Alcohol use: occasional  Assessment/Plan:  Patient was counseled on the purpose, proper use, and adverse effects of methotrexate including nausea, infection, and signs and symptoms of pneumonitis.  Reviewed instructions with patient to take methotrexate weekly along with folic acid daily.  Discussed the importance of frequent monitoring of kidney and liver function and blood counts, and provided patient with standing lab instructions.  Counseled patient to avoid NSAIDs and alcohol while on methotrexate.  Provided patient with educational materials  on methotrexate and answered all questions.  Advised patient to get annual influenza vaccine and to get a pneumococcal vaccine if patient has not already had one.  Patient voiced understanding.  Patient consented to methotrexate use.  Will upload into chart.  Will plan to initiate methotrexate once all of her baseline labs return.    Elisabeth Most, Pharm.D., BCPS, CPP Clinical Pharmacist Pager: [phone] Phone: [phone] 07/06/2017 10:56 AM

## 2017-07-07 ENCOUNTER — Telehealth: Payer: Self-pay

## 2017-07-07 LAB — URINALYSIS, ROUTINE W REFLEX MICROSCOPIC
BILIRUBIN URINE: NEGATIVE
Glucose, UA: NEGATIVE
Hgb urine dipstick: NEGATIVE
Ketones, ur: NEGATIVE
Leukocytes, UA: NEGATIVE
Nitrite: NEGATIVE
Protein, ur: NEGATIVE
SPECIFIC GRAVITY, URINE: 1.021 (ref 1.001–1.035)
pH: 5.5 (ref 5.0–8.0)

## 2017-07-07 LAB — COMPLETE METABOLIC PANEL WITH GFR
ALBUMIN: 4.4 g/dL (ref 3.6–5.1)
ALK PHOS: 58 U/L (ref 33–130)
ALT: 17 U/L (ref 6–29)
AST: 15 U/L (ref 10–35)
BILIRUBIN TOTAL: 0.4 mg/dL (ref 0.2–1.2)
BUN: 16 mg/dL (ref 7–25)
CALCIUM: 9.5 mg/dL (ref 8.6–10.4)
CO2: 23 mmol/L (ref 20–32)
CREATININE: 0.94 mg/dL (ref 0.50–0.99)
Chloride: 105 mmol/L (ref 98–110)
GFR, EST AFRICAN AMERICAN: 74 mL/min (ref >=60)
GFR, Est Non African American: 64 mL/min (ref >=60)
Glucose, Bld: 102 mg/dL — ABNORMAL HIGH (ref 65–99)
Potassium: 4.2 mmol/L (ref 3.5–5.3)
Sodium: 140 mmol/L (ref 135–146)
TOTAL PROTEIN: 7.4 g/dL (ref 6.1–8.1)

## 2017-07-07 LAB — BETA-2 GLYCOPROTEIN ANTIBODIES
Beta-2-Glycoprotein I IgA: <9 SAU (ref <=20)
Beta-2-Glycoprotein I IgM: <9 SMU (ref <=20)

## 2017-07-07 LAB — IGG, IGA, IGM
IGA: 283 mg/dL (ref 81–463)
IGG (IMMUNOGLOBIN G), SERUM: 1373 mg/dL (ref 694–1618)
IGM, SERUM: 82 mg/dL (ref 48–271)

## 2017-07-07 LAB — CYCLIC CITRUL PEPTIDE ANTIBODY, IGG

## 2017-07-07 LAB — ANA: ANA: NEGATIVE

## 2017-07-07 LAB — CARDIOLIPIN ANTIBODIES, IGG, IGM, IGA
Anticardiolipin IgA: <11 [APL'U]
Anticardiolipin IgG: <14 [GPL'U]
Anticardiolipin IgM: <12 [MPL'U]

## 2017-07-07 LAB — HEPATITIS PANEL, ACUTE
HCV Ab: NONREACTIVE
Hep A IgM: NONREACTIVE
Hep B C IgM: NONREACTIVE
Hepatitis B Surface Ag: NONREACTIVE

## 2017-07-07 LAB — RHEUMATOID FACTOR

## 2017-07-07 LAB — CK: CK TOTAL: 78 U/L (ref 29–143)

## 2017-07-07 LAB — ANTI-SCLERODERMA ANTIBODY: SCLERODERMA (SCL-70) (ENA) ANTIBODY, IGG: NEGATIVE

## 2017-07-07 LAB — SEDIMENTATION RATE: Sed Rate: 11 mm/hr (ref 0–30)

## 2017-07-07 LAB — HIV ANTIBODY (ROUTINE TESTING W REFLEX): HIV: NONREACTIVE

## 2017-07-07 NOTE — Telephone Encounter (Signed)
Lm for patient to return call and go over the results of her sleep study and schedule follow-up appointment.

## 2017-07-08 LAB — QUANTIFERON TB GOLD ASSAY (BLOOD)
INTERFERON GAMMA RELEASE ASSAY: NEGATIVE
Mitogen-Nil: >10 IU/mL
QUANTIFERON NIL VALUE: 0.05 [IU]/mL
Quantiferon Tb Ag Minus Nil Value: 0 IU/mL

## 2017-07-09 LAB — RFLX HEXAGONAL PHASE CONFIRM: Hexagonal Phase Confirm: NEGATIVE

## 2017-07-09 LAB — RFX DRVVT SCR W/RFLX CONF 1:1 MIX: DRVVT SCREEN: 48 s — AB (ref <=45)

## 2017-07-09 LAB — RFX PTT-LA W/RFX TO HEX PHASE CONF: PTT-LA SCREEN: 46 s — AB (ref <=40)

## 2017-07-09 LAB — LUPUS ANTICOAGULANT PANEL

## 2017-07-09 LAB — RFLX DRVVT CONFRIM: DRVVT CONFIRMATION: NEGATIVE

## 2017-07-11 NOTE — Telephone Encounter (Signed)
Patient returning a call to get sleep results.

## 2017-07-12 ENCOUNTER — Other Ambulatory Visit: Payer: Self-pay | Admitting: Neurology

## 2017-07-12 LAB — PROTEIN ELECTROPHORESIS, SERUM, WITH REFLEX
ALBUMIN ELP: 3.7 g/dL — AB (ref 3.8–4.8)
Alpha-1-Globulin: 0.4 g/dL — ABNORMAL HIGH (ref 0.2–0.3)
Alpha-2-Globulin: 0.9 g/dL (ref 0.5–0.9)
BETA GLOBULIN: 0.4 g/dL (ref 0.4–0.6)
Beta 2: 0.4 g/dL (ref 0.2–0.5)
Gamma Globulin: 0.9 g/dL (ref 0.8–1.7)
TOTAL PROTEIN, SERUM ELECTROPHOR: 6.7 g/dL (ref 6.1–8.1)

## 2017-07-12 NOTE — Telephone Encounter (Signed)
I called pt. I advised pt that Dr. Brett Fairy reviewed their sleep study results and found that pt did have sleep apnea more so in the REM sleep. Dr. Brett Fairy recommends that pt start CPAP. I reviewed PAP compliance expectations with the pt. Pt is agreeable to starting a CPAP. I advised pt that an order will be sent to a DME, [facility], and [facility] will call the pt within about one week after they file with the pt's insurance. [facility] will show the pt how to use the machine, fit for masks, and troubleshoot the CPAP if needed. A follow up appt was made for insurance purposes with Dr. Brett Fairy on Oct 18 2017 at 10:00 am. Pt verbalized understanding to arrive 15 minutes early and bring their CPAP. A letter with all of this information in it will be mailed to the pt as a reminder. I verified with the pt that the address we have on file is correct. Pt verbalized understanding of results. Pt had no questions at this time but was encouraged to call back if questions arise.

## 2017-07-12 NOTE — Progress Notes (Signed)
OK to start on MTX

## 2017-07-14 ENCOUNTER — Telehealth: Payer: Self-pay | Admitting: Radiology

## 2017-07-14 MED ORDER — FOLIC ACID 1 MG PO TABS: 2.0000 mg | ORAL_TABLET | Freq: Every day | ORAL | 3 refills | Status: DC

## 2017-07-14 MED ORDER — METHOTREXATE 2.5 MG PO TABS: ORAL_TABLET | ORAL | 2 refills | Status: DC

## 2017-07-14 NOTE — Telephone Encounter (Signed)
Methotrexate 4 tablets by mouth every week for 2 weeks and increase it to 6 tablets by mouth every week for 2 weeks and then increase it to 8 tablets by mouth every week Sent in along with folic acid to [facility] patient to advise, review lab plan left message for her to call us back  On home # and also called cell left message there

## 2017-07-14 NOTE — Telephone Encounter (Signed)
-----  Message from Shaili Deveshwar, MD sent at 07/12/2017 12:41 PM EDT ----- OK to start on MTX 

## 2017-07-15 ENCOUNTER — Telehealth: Payer: Self-pay | Admitting: Rheumatology

## 2017-07-15 MED ORDER — METHOTREXATE 2.5 MG PO TABS: ORAL_TABLET | ORAL | 2 refills | Status: DC

## 2017-07-15 MED ORDER — FOLIC ACID 1 MG PO TABS: 2.0000 mg | ORAL_TABLET | Freq: Every day | ORAL | 3 refills | Status: DC

## 2017-07-15 NOTE — Telephone Encounter (Signed)
-----  Message from Bo Merino, MD sent at 07/12/2017 12:41 PM EDT ----- OK to start on MTX

## 2017-07-15 NOTE — Telephone Encounter (Signed)
Patient advised results would be discussed in detail at follow up visit. Advised we are starting the MTX and prescription has been sent to the pharmacy. Patient states she no longer uses [facility] and prescription was resent to [facility]. Reviewed Lab plan. Patient verbalized understanding.

## 2017-07-15 NOTE — Telephone Encounter (Signed)
Patient called about lab results. Please call patient back.

## 2017-08-09 ENCOUNTER — Ambulatory Visit: Payer: BC Managed Care – PPO | Admitting: Rheumatology

## 2017-08-10 ENCOUNTER — Telehealth: Payer: Self-pay | Admitting: Neurology

## 2017-08-10 NOTE — Telephone Encounter (Signed)
casPatient called office in reference to CPAP order/supplies sent to [facility] 07/12/17.  Patient said she has called [facility] to check on the order they stated no order and then found it.  She still have no CPAP machine/supplies when calling [facility] either no one answers or she leaves messages.  Patient isn't to sure what to do now.  Please call

## 2017-10-03 ENCOUNTER — Other Ambulatory Visit: Payer: Self-pay | Admitting: Internal Medicine

## 2017-10-18 ENCOUNTER — Ambulatory Visit: Payer: BC Managed Care – PPO | Admitting: Neurology

## 2017-10-18 ENCOUNTER — Encounter: Payer: Self-pay | Admitting: Neurology

## 2017-10-18 DIAGNOSIS — G4733 Obstructive sleep apnea (adult) (pediatric): Secondary | ICD-10-CM | POA: Diagnosis not present

## 2017-10-18 DIAGNOSIS — Z9989 Dependence on other enabling machines and devices: Secondary | ICD-10-CM | POA: Diagnosis not present

## 2017-10-18 NOTE — Progress Notes (Signed)
SLEEP MEDICINE CLINIC   Provider:  Larey Seat, M D  Primary Care Physician:  Glendale Chard, MD   Referring Provider: Glendale Chard, MD    Chief Complaint  Patient presents with  . Follow-up    pt alone, rm 10 pt states that CPAP is better then when she first started.   alone,  HPI:  Kristine [name] is a 66 y.o. female , follows up today #2018 after undergoing a split-night polysomnography study.  The patient was tested for apnea on 30 June 2017, she had endorsed the Epworth sleepiness score at 16 out of 24 possible points, her BMI was 43.7.   Baseline result was an AHI of 39 during REM sleep exacerbated to 73.8/h and during supine sleep at 39.  The patient actually slept all night in the supine position.  She was titrated to a pressure of 9 cmH2O a very good sleep efficiency residual AHI was 0.8, snoring was controlled he wrote for an auto titration capable CPAP at 10 cm water pressure was 2 cm expiratory pressure relief, he is using a nasal, the patient reports that she has been using CPAP initially with some apprehension but now has gotten used to it.  Her compliance is 80% for time and 97% 4 days, with an average of 4 hours and 18 minutes, set pressure is 10 cm 2 cm EPR, residual AHI is 1.5 she does have sometimes air leakage.  However the nasal pillows on the most comfortable she is less severely fatigued and much less sleepy and daytime, overall she has felt better since using CPAP. [employer] is her DME.  Fatigue 19/ 63 - Epworth 10.     seen here as in a referral  from Dr. Baird Cancer for fatigue , possible  sleep apnea evaluation.  Mrs. IS a 66 year old left-handed, married, African-American lady referred by her primary care physician Dr. Baird Cancer for evaluation of sleep disorders. The patient laughingly reports that her husband told her she snores, she has never woken up by her own snoring. She does have a history of hypertension, exacerbated by anxiety or stress. She has  gained weight, she also was recently evaluated for joint pain and a positive antinuclear antibody test was obtained. She was referred to rheumatology.  Dr. Baird Cancer referred for a sleep study based on her body mass index exceeding 40, the high degree of fatigue and the reported snoring. I was able to review her last laboratory tests. She reports she is short of breath, coughing, nocturnal snoring and daytime decreased energy,  a heart murmur and ankle edema-swelling in her ankles, legs as well as muscle cramps- mostly at night- improved by eating bananas. She reports she is short  of breath, coughing, nocturnal snoring and daytime decreased energy,  a heart murmur and ankle edema-swelling in her ankles, legs as well as muscle cramps- mostly at night- improved by eating bananas.   Sleep habits are as follows: The patient's bedtime is around midnight, it is not difficult for her to initiate sleep but to stay asleep. For the last hour before she goes to bed she usually watches TV. The bedroom is described as core, quiet and dark, but she often uses a tablets in bed for word puzzles or other forms of entertainment. She also states that she has a TV in the bedroom - she goes to sleep with TV ! it is not set to a sleep timer. She actually retreats to the bed room as soon as 11 PM,  once asleep after midnight she can't stay asleep for about 3 hours and then wakes up at 3 AM, 4 AM, 5 AM. She experiences nocturia twice at night. Her sleep is never as refreshing as in the first 3 hours of the night , is ongoingly fragmented. She has to rise for work about 7 AM. She does not feel refreshed in the morning. Over the last couple of weeks she also has experienced some dull head pain that is not a true headache. She does not wake up with headaches, chest pain, dizziness, nausea or palpitations. Sh rarely take naps , but feels refreshed. She can lay down and take a nap. She feels ok after nap of 30 minutes duration   Sleep  medical history and family sleep history: She is unaware of any family members that may have had a sleep related diagnosis. The patient reports that she suffers from these early morning arousals only for the last year or so, has rare hot flushes. Pulmonary Sarcoidosis HX.  She has a lot of congestion, not helped by acid proton pump inhibitors, not related to allergies. Social history: married, works at [employer] and T in [employer]. Non smoker, rarely drinking alcohol. Caffeine : 1 cup coffee a day, not iced tea, soda, nor energy drinks.    Review of Systems: Out of a complete 14 system review, the patient complains of only the following symptoms, and all other reviewed systems are negative. Epworth score 10 from  16 , Fatigue severity score  16 from 41 pre CPAP ,  No more bathroom breaks( nocturia) , sleeps an average of 6 hours.  She is not longer snoring. Her headaches have improved. Less pain, less aches.    Social History   Socioeconomic History  . Marital status: Married    Spouse name: Not on file  . Number of children: Not on file  . Years of education: Not on file  . Highest education level: Not on file  Social Needs  . Financial resource strain: Not on file  . Food insecurity - worry: Not on file  . Food insecurity - inability: Not on file  . Transportation needs - medical: Not on file  . Transportation needs - non-medical: Not on file  Occupational History  . Not on file  Tobacco Use  . Smoking status: Never Smoker  . Smokeless tobacco: Never Used  Substance and Sexual Activity  . Alcohol use: Yes    Comment: occ  . Drug use: No  . Sexual activity: Not on file    Comment: HYST   Other Topics Concern  . Not on file  Social History Narrative  . Not on file    Family History  Problem Relation Age of Onset  . Breast cancer Mother   . Lung cancer Mother   . Lung cancer Father        smoked  . Heart disease Maternal Grandmother     Past Medical History:  Diagnosis  Date  . Allergy   . Arthritis   . Glaucoma    ROD  . Hypertension   . Sarcoidosis     Past Surgical History:  Procedure Laterality Date  . ABDOMINAL HYSTERECTOMY    . BUNIONECTOMY     left  . CHOLECYSTECTOMY    . KNEE SURGERY     right  . LEFT HEART CATHETERIZATION WITH CORONARY ANGIOGRAM N/A 09/12/2014   Procedure: LEFT HEART CATHETERIZATION WITH CORONARY ANGIOGRAM;  Surgeon: Troy Sine, MD;  Location: [facility];  Service: Cardiovascular;  Laterality: N/A;    Current Outpatient Medications  Medication Sig Dispense Refill  . aspirin EC 81 MG tablet Take 1 tablet (81 mg total) by mouth daily.    . Brinzolamide-Brimonidine (SIMBRINZA) 1-0.2 % SUSP Apply 1 drop to eye 2 (two) times daily.    . Cholecalciferol (VITAMIN D3) 5000 UNITS CAPS Take 5,000 Units by mouth daily.    . metoprolol succinate (TOPROL-XL) 25 MG 24 hr tablet Take 25 mg by mouth daily.    . pantoprazole (PROTONIX) 40 MG tablet TAKE ONE TABLET BY MOUTH DAILY 30 TO 60 MINUTES BEFORE FIRST MEAL OF THE DAY 30 tablet 0  . prednisoLONE acetate (PRED FORTE) 1 % ophthalmic suspension 1 drop every other day.    Marland Kitchen PROAIR HFA 108 (90 Base) MCG/ACT inhaler Inhale 2 puffs into the lungs every 4 (four) hours as needed.    Marland Kitchen telmisartan (MICARDIS) 80 MG tablet      No current facility-administered medications for this visit.     Allergies as of 10/18/2017  . (No Known Allergies)    Vitals: BP 124/76   Pulse 74   Ht _0  (1.676 m)   Wt 262 lb (118.8 kg)   BMI 42.29 kg/m  Last Weight:  Wt Readings from Last 1 Encounters:  10/18/17 262 lb (118.8 kg)   XAJ:OINO mass index is 42.29 kg/m.     Last Height:   Ht Readings from Last 1 Encounters:  10/18/17 _1  (1.676 m)    Physical exam:  General: The patient is awake, alert and appears not in acute distress. The patient is well groomed. Head: Normocephalic, atraumatic. Neck is supple. Mallampati 4,  neck circumference:16.5 . Nasal airflow patent , allergic  rhinitis-  Retrognathia is seen.  Cardiovascular:  Regular rate and rhythm, without  murmurs or carotid bruit, and without distended neck veins. Respiratory: Lungs are clear to auscultation. Ankle edema resolved.  Trunk: BMI is 44. The patient's posture is erect.   Neurologic exam : The patient is awake and alert, oriented to place and time.    Cranial nerves:  no changes in taste and smell . Pupils are equal and briskly reactive to light. Extraocular movements  in vertical and horizontal planes full- without nystagmus.  Visual fields by finger perimetry are intact. Hearing to finger rub intact.  Facial sensation intact to fine touch. Facial motor strength is symmetric and tongue and uvula move midline. Shoulder shrug was symmetrical.  Motor exam:  Normal tone, muscle bulk and symmetric strength in all extremities.Sensory:  Fine touch, pinprick and vibration were normal. Coordination: Rapid alternating movements/Finger-to-nose maneuver  normal without evidence of ataxia, dysmetria or tremor. Gait and station: Patient walks without assistive device Strength within normal limits. Stance is stable and normal.  Deep tendon reflexes: in the  upper and lower extremities are symmetric and intact.    Assessment:  After physical and neurologic examination, review of laboratory studies,  Personal review of imaging studies, reports of other /same  Imaging studies, results of polysomnography and / or neurophysiology testing and pre-existing records as far as provided in visit., my assessment is   1) Mrs. Wooley was surprised to her the report of her sleep apnea results, the severity and is more surprised how CPAP changed her sleep quality. Her pains and aches have improved, her snoring is resolved, as is Nocturia.  I spent more than 25 minutes of face to face time with the patient.  Greater than 50% of time was spent in counseling and coordination of care. We have discussed the diagnosis and differential  and I answered the patient's questions.    Plan:  Treatment plan and additional workup :  Continue CPAP at 10 cm water with EPR and nasal pillow, DME is [facility].  Offered referral to [facility] - she already has an appointment next Wednesday at [facility], she was referred to nutritionist.    Larey Seat, MD 33/04/1223, 49:75 AM  Certified in Neurology by ABPN Certified in [place] by [facility] [address], [place], [place] [address]

## 2017-10-18 NOTE — Patient Instructions (Signed)
Cough, Adult A cough helps to clear your throat and lungs. A cough may last only 2-3 weeks (acute), or it may last longer than 8 weeks (chronic). Many different things can cause a cough. A cough may be a sign of an illness or another medical condition. Follow these instructions at home:  Pay attention to any changes in your cough.  Take medicines only as told by your doctor. ? If you were prescribed an antibiotic medicine, take it as told by your doctor. Do not stop taking it even if you start to feel better. ? Talk with your doctor before you try using a cough medicine.  Drink enough fluid to keep your pee (urine) clear or pale yellow.  If the air is dry, use a cold steam vaporizer or humidifier in your home.  Stay away from things that make you cough at work or at home.  If your cough is worse at night, try using extra pillows to raise your head up higher while you sleep.  Do not smoke, and try not to be around smoke. If you need help quitting, ask your doctor.  Do not have caffeine.  Do not drink alcohol.  Rest as needed. Contact a doctor if:  You have new problems (symptoms).  You cough up yellow fluid (pus).  Your cough does not get better after 2-3 weeks, or your cough gets worse.  Medicine does not help your cough and you are not sleeping well.  You have pain that gets worse or pain that is not helped with medicine.  You have a fever.  You are losing weight and you do not know why.  You have night sweats. Get help right away if:  You cough up blood.  You have trouble breathing.  Your heartbeat is very fast. This information is not intended to replace advice given to you by your health care provider. Make sure you discuss any questions you have with your health care provider. Document Released: 07/15/2011 Document Revised: 04/08/2016 Document Reviewed: 01/08/2015 Elsevier Interactive Patient Education  [employer].

## 2017-10-31 ENCOUNTER — Other Ambulatory Visit: Payer: Self-pay | Admitting: Internal Medicine

## 2017-11-30 ENCOUNTER — Other Ambulatory Visit: Payer: Self-pay | Admitting: Internal Medicine

## 2017-12-08 ENCOUNTER — Encounter: Payer: Self-pay | Admitting: Acute Care

## 2017-12-08 ENCOUNTER — Ambulatory Visit (INDEPENDENT_AMBULATORY_CARE_PROVIDER_SITE_OTHER)
Admission: RE | Admit: 2017-12-08 | Discharge: 2017-12-08 | Disposition: A | Discharge status: Home / Self Care | Payer: BC Managed Care – PPO | Source: Ambulatory Visit | Attending: Acute Care | Admitting: Acute Care

## 2017-12-08 ENCOUNTER — Ambulatory Visit: Payer: BC Managed Care – PPO | Admitting: Acute Care

## 2017-12-08 VITALS — BP 150/88 | HR 84 | Ht 66.0 in | Wt 262.0 lb

## 2017-12-08 DIAGNOSIS — R059 Cough, unspecified: Secondary | ICD-10-CM

## 2017-12-08 DIAGNOSIS — J410 Simple chronic bronchitis: Secondary | ICD-10-CM | POA: Diagnosis not present

## 2017-12-08 DIAGNOSIS — R05 Cough: Secondary | ICD-10-CM | POA: Diagnosis not present

## 2017-12-08 LAB — NITRIC OXIDE: Nitric Oxide: 5

## 2017-12-08 MED ORDER — ALBUTEROL SULFATE HFA 108 (90 BASE) MCG/ACT IN AERS: 2.0000 | INHALATION_SPRAY | Freq: Four times a day (QID) | RESPIRATORY_TRACT | 3 refills | Status: DC | PRN

## 2017-12-08 MED ORDER — HYDROCODONE-HOMATROPINE 5-1.5 MG/5ML PO SYRP: 5.0000 mL | ORAL_SOLUTION | Freq: Four times a day (QID) | ORAL | 0 refills | Status: DC | PRN

## 2017-12-08 MED ORDER — PREDNISONE 10 MG PO TABS: ORAL_TABLET | ORAL | 0 refills | Status: DC

## 2017-12-08 NOTE — Progress Notes (Signed)
hydromet

## 2017-12-08 NOTE — Progress Notes (Signed)
History of Present Illness Kristine [name] is a 67 y.o. female with sarcoidosis/ iritis, and upper airway cough syndrome. She is followed by Dr. Melvyn Novas.  70 yobf never smoker dx with sarcoid / iritis on and off prednisone mostly for eye issues with wt gain x around 20 lbs > indolent onset of progressive sob x feb 2015 both at rest and also with exertion with some chest tightness and neg cardiac w/u including LHC 09/12/14 with nl LV  so referred to pulmonary clinic 09/30/2014 by Dr Uvaldo Bristle with original dx of sarcoid in 1998 at wt 240  baseline.  12/08/2017 Acute OV for Sinusitis: Pt. Present for acute Ov with sinusitis. She states she started having symptoms on 12/02/2017. She had a bad cough and states she just really felt bad. She went to urgent care 12/03/17.Marland Kitchen She was told she had a sinus infection that was draining into her chest which was causing the cough. She was treated with prednisone dose pack and z pack. She was told to take Benzonatate 200 mg every 8 hours for cough, and Sudafed PE. She presents today stating she  has 2 more doses of 10 mg prednisone. She has completed the z-pack. She states that she feels secretions in her chest but she cannot cough them up. Secretions are clear to yellow.She is still getting some blood when she blows her nose.She states she has had some periods where she has been warm, but did not check her temperature.She denies chest pain, orthopnea or hemoptysis.  Test Results: FENO is < 5 12/08/2017>> CXR  CBC Latest Ref Rng & Units 07/06/2017 07/30/2016 09/11/2014  WBC 3.8 - 10.8 K/uL 6.2 8.6 6.7  Hemoglobin 11.7 - 15.5 g/dL 12.4 12.2 11.3(L)  Hematocrit 35.0 - 45.0 % 38.7 36.6 34.4(L)  Platelets 140 - 400 K/uL 273 271.0 234.0    BMP Latest Ref Rng & Units 07/06/2017 09/11/2014 07/11/2014  Glucose 65 - 99 mg/dL 102(H) 97 96  BUN 7 - 25 mg/dL _0 Creatinine 0.50 - 0.99 mg/dL 0.94 0.9 0.93  Sodium 135 - 146 mmol/L 140 141 140  Potassium 3.5 - 5.3 mmol/L  4.2 3.5 4.0  Chloride 98 - 110 mmol/L 105 110 105  CO2 20 - 32 mmol/L _1 Calcium 8.6 - 10.4 mg/dL 9.5 9.0 9.1    BNP No results found for: BNP  ProBNP    Component Value Date/Time   PROBNP 34.0 07/30/2016 1623    PFT    Component Value Date/Time   FEV1PRE 2.61 07/30/2016 1400   FEV1POST 2.66 07/30/2016 1400   FVCPRE 3.26 07/30/2016 1400   FVCPOST 3.26 07/30/2016 1400   TLC 5.43 07/30/2016 1400   DLCOUNC 22.87 07/30/2016 1400   PREFEV1FVCRT 80 07/30/2016 1400   PSTFEV1FVCRT 82 07/30/2016 1400    Dg Chest 2 View  Result Date: 12/08/2017 CLINICAL DATA:  Cough and chest congestion for the past week. History of hypertension, sarcoidosis, nonsmoker. EXAM: CHEST  2 VIEW COMPARISON:  Chest x-ray of December 23, 2016 FINDINGS: The lungs are adequately inflated. There is no focal infiltrate. The interstitial markings are mildly prominent but stable. There is bilateral hilar lymphadenopathy which appears stable. The heart and pulmonary vascularity are normal. There tortuosity of the descending thoracic aorta. The bony thorax exhibits no acute abnormality. IMPRESSION: No acute pneumonia nor CHF. Bilateral hilar lymphadenopathy and mild chronic interstitial prominence consistent with known sarcoidosis. Electronically Signed   By: David  Martinique M.D.   On:  12/08/2017 12:15     Past medical hx Past Medical History:  Diagnosis Date  . Allergy   . Arthritis   . Glaucoma    ROD  . Hypertension   . Sarcoidosis      Social History   Tobacco Use  . Smoking status: Never Smoker  . Smokeless tobacco: Never Used  Substance Use Topics  . Alcohol use: Yes    Comment: occ  . Drug use: No    Kristine [name] reports that  has never smoked. she has never used smokeless tobacco. She reports that she drinks alcohol. She reports that she does not use drugs.  Tobacco Cessation: Never smoker  Past surgical hx, Family hx, Social hx all reviewed.  Current Outpatient Medications on File Prior  to Visit  Medication Sig  . aspirin EC 81 MG tablet Take 1 tablet (81 mg total) by mouth daily.  . Brinzolamide-Brimonidine (SIMBRINZA) 1-0.2 % SUSP Apply 1 drop to eye 2 (two) times daily.  . Cholecalciferol (VITAMIN D3) 5000 UNITS CAPS Take 5,000 Units by mouth daily.  . metoprolol succinate (TOPROL-XL) 25 MG 24 hr tablet Take 25 mg by mouth daily.  . pantoprazole (PROTONIX) 40 MG tablet TAKE ONE TABLET BY MOUTH DAILY 30 TO 60 MINUTES BEFORE FIRST MEAL OF THE DAY  . prednisoLONE acetate (PRED FORTE) 1 % ophthalmic suspension 1 drop every other day.  Marland Kitchen PROAIR HFA 108 (90 Base) MCG/ACT inhaler Inhale 2 puffs into the lungs every 4 (four) hours as needed.  Marland Kitchen telmisartan (MICARDIS) 80 MG tablet    No current facility-administered medications on file prior to visit.      No Known Allergies  Review Of Systems:  Constitutional:   No  weight loss, night sweats,  Fevers, chills, + fatigue, or  lassitude.  HEENT:   + headaches 2/2 cough,  Difficulty swallowing,  Tooth/dental problems, or  Sore throat,                No sneezing, itching, ear ache, + nasal congestion, + post nasal drip,   CV:  No chest pain,  Orthopnea, PND, swelling in lower extremities, anasarca, dizziness, palpitations, syncope.   GI  No heartburn, indigestion, abdominal pain, nausea, vomiting, diarrhea, change in bowel habits, loss of appetite, bloody stools.   Resp: No shortness of breath with exertion or at rest.  + excess mucus, + productive cough,  No non-productive cough,  No coughing up of blood.  No change in color of mucus.  No wheezing.  No chest wall deformity  Skin: no rash or lesions., warm and dry  GU: no dysuria, change in color of urine, no urgency or frequency.  No flank pain, no hematuria   MS:  No joint pain or swelling.  No decreased range of motion.  No back pain.  Psych:  No change in mood or affect. No depression or anxiety.  No memory loss.   Vital Signs BP (!) 150/88 (BP Location: Left Arm,  Cuff Size: Normal)   Pulse 84   Ht _0  (1.676 m)   Wt 262 lb (118.8 kg)   SpO2 92%   BMI 42.29 kg/m    Physical Exam:  General- No distress,  A&Ox3, pleasant, coughing and throat clearing in the office ENT: No sinus tenderness, TM clear, edematous nasal mucosa, no oral exudate,+ post nasal drip, no LAN Cardiac: S1, S2, regular rate and rhythm, no murmur Chest: No wheeze/ rales/ dullness; no accessory muscle use, no nasal flaring, no sternal  retractions Abd.: Soft Non-tender, non-distended Ext: No clubbing cyanosis, edema Neuro:  normal strength Skin: No rashes, warm and dry Psych: normal mood and behavior   Assessment/Plan  Simple chronic bronchitis (HCC) Bronchitis vs Upper Airway cough Plan: We will check a FENO now No throat clearing, sips of water instead Throat soothing with sugar free Werther's or Jolly Ranchers Delsym cough syrup every 12 hours Hydromet cough syrup 5 cc's at bedtime for cough. Prednisone taper; 10 mg tablets: 4 tabs x 2 days, 3 tabs x 2 days, 2 tabs x 2 days 1 tab x 2 days then stop. Do not use Hydromet with Benzonatate  Do not drive if sleepy. CXR today We will call you with results Benzonatate 200 mg every 8 hours for cough, and Sudafed PE. Per urgent care. Flonase nasal spray 2 puffs each nare once daily Continue Protonix 40 mg daily We will renew prescription for Dynegy. Follow up in 3 weeks with Dr. Melvyn Novas or Cortney Beissel Please contact office for sooner follow up if symptoms do not improve or worsen or seek emergency care       Magdalen Spatz, NP 12/08/2017  3:50 PM

## 2017-12-08 NOTE — Assessment & Plan Note (Signed)
Bronchitis vs Upper Airway cough Plan: We will check a FENO now No throat clearing, sips of water instead Throat soothing with sugar free Werther's or Jolly Ranchers Delsym cough syrup every 12 hours Hydromet cough syrup 5 cc's at bedtime for cough. Prednisone taper; 10 mg tablets: 4 tabs x 2 days, 3 tabs x 2 days, 2 tabs x 2 days 1 tab x 2 days then stop. Do not use Hydromet with Benzonatate  Do not drive if sleepy. CXR today We will call you with results Benzonatate 200 mg every 8 hours for cough, and Sudafed PE. Per urgent care. Flonase nasal spray 2 puffs each nare once daily Continue Protonix 40 mg daily We will renew prescription for Dynegy. Follow up in 3 weeks with Dr. Melvyn Novas or Sarah Please contact office for sooner follow up if symptoms do not improve or worsen or seek emergency care

## 2017-12-08 NOTE — Patient Instructions (Addendum)
It is nice to meet you today. We will check a FENO now No throat clearing, sips of water instead Throat soothing with sugar free Werther's or Jolly Ranchers Delsym cough syrup every 12 hours Hydromet cough syrup 5 cc's at bedtime for cough. Prednisone taper; 10 mg tablets: 4 tabs x 2 days, 3 tabs x 2 days, 2 tabs x 2 days 1 tab x 2 days then stop. Do not use Hydromet with Benzonatate  Do not drive if sleepy. CXR today We will call you with results Benzonatate 200 mg every 8 hours for cough, and Sudafed PE. Per urgent care. Flonase nasal spray 2 puffs each nare once daily Continue Protonix 40 mg daily We will renew prescription for Dynegy. Follow up in 3 weeks with Dr. Melvyn Novas or Sarah Please contact office for sooner follow up if symptoms do not improve or worsen or seek emergency care

## 2017-12-08 NOTE — Progress Notes (Signed)
Chart and office note reviewed in detail  > agree with a/p as outlined

## 2017-12-09 ENCOUNTER — Ambulatory Visit: Payer: BC Managed Care – PPO | Admitting: Internal Medicine

## 2017-12-12 ENCOUNTER — Telehealth: Payer: Self-pay | Admitting: Acute Care

## 2017-12-12 NOTE — Telephone Encounter (Signed)
Pt is returning call about results. Cb is [phone].

## 2017-12-12 NOTE — Telephone Encounter (Signed)
Advised pt of results. Pt understood and nothing further is needed.

## 2017-12-12 NOTE — Telephone Encounter (Signed)
Notes recorded by Magdalen Spatz, NP on 12/08/2017 at 2:49 PM EST Please call patient and let her know her chest x-ray did not indicate pneumonia. Please have her follow the plan of care developed in the office today, with follow-up as was specified. Thank you    Spoke with pt and notified of results per SG. Pt verbalized understanding and denied any questions.

## 2017-12-29 ENCOUNTER — Ambulatory Visit: Payer: BC Managed Care – PPO | Admitting: Internal Medicine

## 2017-12-30 ENCOUNTER — Other Ambulatory Visit: Payer: Self-pay | Admitting: Internal Medicine

## 2018-02-28 ENCOUNTER — Other Ambulatory Visit: Payer: Self-pay | Admitting: Internal Medicine

## 2018-03-30 ENCOUNTER — Other Ambulatory Visit: Payer: Self-pay | Admitting: Acute Care

## 2018-05-05 ENCOUNTER — Other Ambulatory Visit: Payer: Self-pay | Admitting: Acute Care

## 2018-06-22 ENCOUNTER — Other Ambulatory Visit: Payer: Self-pay | Admitting: Acute Care

## 2018-07-04 ENCOUNTER — Telehealth: Payer: Self-pay | Admitting: Internal Medicine

## 2018-07-04 NOTE — Telephone Encounter (Signed)
Faxed 100 pages clinical documents to Dr. Glendale Chard at [address] [place], [place], [place] [address].  Phone # [phone].  Fax # [phone].  Fax confirmed electronically per Epic 8/20 @ 1625.  Patient has not been see since 2015.

## 2018-07-24 DIAGNOSIS — Z79899 Other long term (current) drug therapy: Secondary | ICD-10-CM | POA: Diagnosis not present

## 2018-07-24 DIAGNOSIS — E785 Hyperlipidemia, unspecified: Secondary | ICD-10-CM | POA: Insufficient documentation

## 2018-07-24 DIAGNOSIS — Z7982 Long term (current) use of aspirin: Secondary | ICD-10-CM | POA: Diagnosis not present

## 2018-07-24 DIAGNOSIS — I10 Essential (primary) hypertension: Secondary | ICD-10-CM | POA: Diagnosis not present

## 2018-07-24 LAB — LIPID PANEL
Cholesterol: 134 (ref 0–200)
HDL: 48 (ref 35–70)
LDL Cholesterol: 68
LDl/HDL Ratio: 1.4
Triglycerides: 88 (ref 40–160)

## 2018-07-24 LAB — HEMOGLOBIN A1C: Hgb A1c MFr Bld: 6 (ref 4.0–6.0)

## 2018-08-08 ENCOUNTER — Other Ambulatory Visit (HOSPITAL_COMMUNITY): Payer: Self-pay | Admitting: General Surgery

## 2018-08-09 ENCOUNTER — Other Ambulatory Visit (HOSPITAL_COMMUNITY): Payer: Self-pay | Admitting: General Surgery

## 2018-08-09 DIAGNOSIS — E669 Obesity, unspecified: Secondary | ICD-10-CM

## 2018-08-11 ENCOUNTER — Ambulatory Visit (HOSPITAL_COMMUNITY)
Admission: RE | Admit: 2018-08-11 | Discharge: 2018-08-11 | Disposition: A | Discharge status: Home / Self Care | Payer: BC Managed Care – PPO | Source: Ambulatory Visit | Attending: General Surgery | Admitting: General Surgery

## 2018-08-11 DIAGNOSIS — I444 Left anterior fascicular block: Secondary | ICD-10-CM | POA: Diagnosis not present

## 2018-08-11 DIAGNOSIS — E669 Obesity, unspecified: Secondary | ICD-10-CM | POA: Insufficient documentation

## 2018-08-16 ENCOUNTER — Other Ambulatory Visit: Payer: Self-pay | Admitting: Acute Care

## 2018-08-16 ENCOUNTER — Other Ambulatory Visit: Payer: Self-pay | Admitting: Nurse Practitioner

## 2018-08-24 ENCOUNTER — Encounter: Payer: Self-pay | Admitting: Skilled Nursing Facility1

## 2018-08-24 ENCOUNTER — Encounter: Payer: BC Managed Care – PPO | Attending: General Surgery | Admitting: Skilled Nursing Facility1

## 2018-08-24 DIAGNOSIS — Z713 Dietary counseling and surveillance: Secondary | ICD-10-CM | POA: Diagnosis not present

## 2018-08-24 DIAGNOSIS — E669 Obesity, unspecified: Secondary | ICD-10-CM

## 2018-08-24 DIAGNOSIS — Z6841 Body Mass Index (BMI) 40.0 and over, adult: Secondary | ICD-10-CM | POA: Diagnosis not present

## 2018-08-24 NOTE — Progress Notes (Addendum)
Pre-Op Assessment Visit:  Pre-Operative sleeve or RYGB Surgery  Medical Nutrition Therapy:  Appt start time:  4:40 End time:  5:40  Patient was seen on 08/24/2018 for Pre-Operative Nutrition Assessment. Assessment and letter of approval faxed to [facility] Bariatric Surgery Program coordinator on 08/24/2018.   Pts referral states Possible silent reflux leading to pulmonary issues. Pt was struggling to understand the mental health connection to bariatric surgery. Pt states all the dietary changes seem very daunting and just hard maybe just too hard. Pt states she just really does not know if she wants the surgery or not.  Pt states she has no structure when it comes to eating.  Pt will come for SWL next month to allow her the time to make her decision.   Pt will work on walking 4 days a Week for 30 minutes and eat 3 times a day  Pt expectation of surgery: to be healthy  Pt expectation of Dietitian: none  Start weight at [facility]: 260.1 BMI: 41.98  24 hr Dietary Recall: First Meal: protein shake Snack: nuts Second Meal: pasta Snack: fruit Third Meal: soup Snack:  Beverages: water, coffee  Encouraged to engage in 150 minutes of moderate physical activity including cardiovascular and weight baring weekly  Handouts given during visit include:  . Pre-Op Goals . Bariatric Surgery Protein Shakes During the appointment today the following Pre-Op Goals were reviewed with the patient: . Maintain or lose weight as instructed by your surgeon . Make healthy food choices . Begin to limit portion sizes . Limited concentrated sugars and fried foods . Keep fat/sugar in the single digits per serving on             food labels . Practice CHEWING your food  (aim for 30 chews per bite or until applesauce consistency) . Practice not drinking 15 minutes before, during, and 30 minutes after each meal/snack . Avoid all carbonated beverages  . Avoid/limit caffeinated beverages  . Avoid all  sugar-sweetened beverages . Consume 3 meals per day; eat every 3-5 hours . Make a list of non-food related activities . Aim for 64-100 ounces of FLUID daily  . Aim for at least 60-80 grams of PROTEIN daily . Look for a liquid protein source that contain ?15 g protein and ?5 g carbohydrate  (ex: shakes, drinks, shots)  -Follow diet recommendations listed below   Energy and Macronutrient Recomendations: Calories: 1500 Carbohydrate: 170 Protein: 112 Fat: 42  Demonstrated degree of understanding via:  Teach Back  Teaching Method Utilized:  Visual Auditory Hands on  Barriers to learning/adherence to lifestyle change: unsure if she is ready for this kind of comitant   Patient to call the [facility] to enroll in Pre-Op and Post-Op Nutrition Education when surgery date is scheduled.

## 2018-08-27 ENCOUNTER — Encounter: Payer: Self-pay | Admitting: Internal Medicine

## 2018-08-27 DIAGNOSIS — E785 Hyperlipidemia, unspecified: Secondary | ICD-10-CM

## 2018-09-04 ENCOUNTER — Ambulatory Visit: Payer: BC Managed Care – PPO | Admitting: Internal Medicine

## 2018-09-04 ENCOUNTER — Encounter: Payer: Self-pay | Admitting: Internal Medicine

## 2018-09-04 VITALS — BP 136/88 | HR 75 | Temp 97.9°F | Ht 66.0 in | Wt 260.4 lb

## 2018-09-04 DIAGNOSIS — R7309 Other abnormal glucose: Secondary | ICD-10-CM

## 2018-09-04 DIAGNOSIS — J301 Allergic rhinitis due to pollen: Secondary | ICD-10-CM

## 2018-09-04 DIAGNOSIS — I10 Essential (primary) hypertension: Secondary | ICD-10-CM

## 2018-09-04 NOTE — Progress Notes (Signed)
Subjective:     Patient ID: Kristine [name] , female    DOB: 01-Mar-1951 , 67 y.o.   MRN: [id number]   Hypertension  This is a chronic problem. The current episode started more than 1 year ago. The problem has been gradually improving since onset. The problem is uncontrolled. Risk factors for coronary artery disease include post-menopausal state, obesity and sedentary lifestyle.   She reports compliance with meds.   Past Medical History:  Diagnosis Date  . Allergy   . Arthritis   . Glaucoma    ROD  . Hypertension   . Sarcoidosis       Current Outpatient Medications:  .  albuterol (PROAIR HFA) 108 (90 Base) MCG/ACT inhaler, Inhale 2 puffs into the lungs every 6 (six) hours as needed for wheezing or shortness of breath., Disp: 1 Inhaler, Rfl: 3 .  aspirin EC 81 MG tablet, Take 1 tablet (81 mg total) by mouth daily., Disp: , Rfl:  .  atorvastatin (LIPITOR) 10 MG tablet, Take 10 mg by mouth daily., Disp: , Rfl:  .  Brinzolamide-Brimonidine (SIMBRINZA) 1-0.2 % SUSP, Apply 1 drop to eye 2 (two) times daily., Disp: , Rfl:  .  CALCIUM CARBONATE PO, Take by mouth., Disp: , Rfl:  .  Cholecalciferol (VITAMIN D3) 5000 UNITS CAPS, Take 5,000 Units by mouth daily., Disp: , Rfl:  .  famotidine (PEPCID) 20 MG tablet, TAKE ONE TABLET BY MOUTH AT BEDTIME, Disp: 30 tablet, Rfl: 5 .  HYDROcodone-homatropine (HYDROMET) 5-1.5 MG/5ML syrup, Take 5 mLs by mouth every 6 (six) hours as needed for cough., Disp: 120 mL, Rfl: 0 .  metoprolol succinate (TOPROL-XL) 25 MG 24 hr tablet, TAKE ONE TABLET BY MOUTH DAILY, Disp: 60 tablet, Rfl: 3 .  nystatin (NYSTATIN) powder, Apply topically 4 (four) times daily., Disp: , Rfl:  .  pantoprazole (PROTONIX) 40 MG tablet, TAKE ONE TABLET BY MOUTH DAILY 30 TO 60 MINUTES BEFORE FIRST MEAL OF THE DAY *NEED OFFICE VISIT FOR FURTHER REFILLS*, Disp: 30 tablet, Rfl: 0 .  prednisoLONE acetate (PRED FORTE) 1 % ophthalmic suspension, 1 drop every other day., Disp: , Rfl:  .   telmisartan (MICARDIS) 80 MG tablet, , Disp: , Rfl:    No Known Allergies   Review of Systems  Constitutional: Negative.   HENT: Positive for postnasal drip and rhinorrhea.   Respiratory: Negative.   Cardiovascular: Negative.   Gastrointestinal: Negative.   Psychiatric/Behavioral: Negative.      Today's Vitals   09/04/18 1452  BP: 136/88  Pulse: 75  Temp: 97.9 F (36.6 C)  TempSrc: Oral  Weight: 260 lb 6.4 oz (118.1 kg)  Height: _0  (1.676 m)   Body mass index is 42.03 kg/m.   Objective:  Physical Exam  Constitutional: She appears well-developed and well-nourished.  HENT:  Head: Normocephalic and atraumatic.  Pharyngeal erythema  Eyes: EOM are normal.  Neck: Normal range of motion.  Cardiovascular: Normal rate, regular rhythm and normal heart sounds.  Pulmonary/Chest: Effort normal and breath sounds normal.  Skin: Skin is warm and dry.  Psychiatric: She has a normal mood and affect.  Nursing note and vitals reviewed.       Assessment And Plan:     1. Essential hypertension, benign  Much improved from last visit, not yet at goal. She will continue with current medications for now. Importance of medication compliance was discussed with the patient. She is encouraged to forward me bp readings at other MD appts.   2. Other  abnormal glucose  Last a1c 6.0, Sept 2019. She is encouraged to avoid sugary beverages AND to incorporate more exercise into her daily routine. .   3. Seasonal allergic rhinitis due to pollen  She is advised to take Zyrtec, 59m nightly. Pt advised that her sx would likely improve with daily use. I will add nasal steroid spray should her sx persist.   4. Morbid [place]  She is pursuing gastric sleeve surgery. She is followed by [facility] - currently undergoing psychological eval and nutrition eval. She was congratulated on taking the first step to address her medical issues.  RMaximino Greenland MD

## 2018-09-04 NOTE — Patient Instructions (Signed)

## 2018-09-14 ENCOUNTER — Other Ambulatory Visit: Payer: Self-pay | Admitting: Acute Care

## 2018-09-25 ENCOUNTER — Encounter: Payer: Self-pay | Admitting: Skilled Nursing Facility1

## 2018-09-25 ENCOUNTER — Encounter: Payer: BC Managed Care – PPO | Attending: General Surgery | Admitting: Skilled Nursing Facility1

## 2018-09-25 DIAGNOSIS — Z6841 Body Mass Index (BMI) 40.0 and over, adult: Secondary | ICD-10-CM | POA: Diagnosis not present

## 2018-09-25 DIAGNOSIS — Z713 Dietary counseling and surveillance: Secondary | ICD-10-CM | POA: Diagnosis not present

## 2018-09-25 DIAGNOSIS — E669 Obesity, unspecified: Secondary | ICD-10-CM

## 2018-09-25 NOTE — Progress Notes (Signed)
sleeve or RYGB Assessment:  1st SWL Appointment.   Pts referral states Possible silent reflux leading to pulmonary issues. Pt was struggling to understand the mental health connection to bariatric surgery. Pt states all the dietary changes seem very daunting and just hard maybe just too hard. Pt states she has no structure when it comes to eating.   Pt states she wants surgery before the end of the year. Pt arrived not having worked on any lifestyle changes from previous visit. Pt will call [facility] for either a SWL appt or a preop class appt.   Pt was encouraged to not rush her journey to surgery due to not having made any changes in her life and the implications of this post surgery.   Pts goal for next appt: eat 3 meals a day consistently starting within 1 hour-1.5 hours of waking.  Start weight at [facility]: 260.1 Weight: 256.9 BMI: 41.46  MEDICATIONS: See List   DIETARY INTAKE:  24 hr Dietary Recall: fast food First Meal: protein shake or waffle and bacon or bevita cookie Snack: nuts Second Meal: pasta or skipped Snack: fruit or baby ruth candy Third Meal: soup or chicken  Snack:  Beverages: water, coffee  Usual physical activity: ADL's  Diet to Follow: 1600 calories 180 g carbohydrates 120 g protein 44 g fat   Nutritional Diagnosis:  [place]-3.3 Overweight/obesity related to past poor dietary habits and physical inactivity as evidenced by patient w/ planned sleeve or RYGB surgery following dietary guidelines for continued weight loss.    Intervention:  Nutrition counseling for upcoming Bariatric Surgery. Goals: -Encouraged to engage in 150 minutes of moderate physical activity including cardiovascular and weight baring weekly -eat 3 meals a day consistently starting within 1 hour-1.5 hours of waking. Teaching Method Utilized:  Visual Auditory Hands on  Barriers to learning/adherence to lifestyle change: pre contemplative stage of change  Demonstrated degree of  understanding via:  Teach Back   Monitoring/Evaluation:  Dietary intake, exercise, and body weight prn.

## 2018-10-17 ENCOUNTER — Ambulatory Visit: Payer: BC Managed Care – PPO | Admitting: Internal Medicine

## 2018-10-17 ENCOUNTER — Encounter: Payer: Self-pay | Admitting: Internal Medicine

## 2018-10-17 VITALS — BP 140/78 | HR 78 | Ht 66.0 in | Wt 258.0 lb

## 2018-10-17 DIAGNOSIS — R05 Cough: Secondary | ICD-10-CM

## 2018-10-17 DIAGNOSIS — D869 Sarcoidosis, unspecified: Secondary | ICD-10-CM

## 2018-10-17 DIAGNOSIS — R0609 Other forms of dyspnea: Secondary | ICD-10-CM | POA: Diagnosis not present

## 2018-10-17 DIAGNOSIS — R058 Other specified cough: Secondary | ICD-10-CM

## 2018-10-17 DIAGNOSIS — R0600 Dyspnea, unspecified: Secondary | ICD-10-CM

## 2018-10-17 NOTE — Patient Instructions (Addendum)
Please see patient coordinator before you leave today  to schedule sinus ct and high resolution chest CT   Dr Mila Palmer last note needs to be reviewed - will request it    Next available visit with me with  full PFTs

## 2018-10-17 NOTE — Progress Notes (Signed)
Subjective:   Patient ID: Kristine [name], female    DOB: 1951/07/07,    MRN: [id number]   Brief patient profile:  67 yobf never smoker dx with sarcoid / iritis on and off prednisone mostly for eye issues with wt gain x around 20 lbs > indolent onset of progressive sob x feb 2015 both at rest and also with exertion with some chest tightness and neg cardiac w/u including LHC 09/12/14 with nl LV  so referred to pulmonary clinic 09/30/2014 by Dr Uvaldo Bristle with original dx of sarcoid in 1998 at wt 240  baseline   History of Present Illness  09/30/2014 1st [facility] office visit/ Kristine [name]  / wt 71  Chief Complaint  Patient presents with  . Pulmonary Consult    Referred by Dr. Renold Genta. Pt states that she was dxed with Sarcoid in 1998. She c/o increased SOB x 6 months. She states that she gets SOB with or without any exertion.    indolent onset gradually worse assoc with overt HB ? Worse x one year but not cough while on acei  Most ex s stopping = malls but slowed by fatgiue  No noct symptoms at all. rec Stop lisinopril and start diovan 160 mg daily in its place Try prilosec 57m  Take 30-60 min before first meal of the day and Pepcid 20 mg one bedtime  GERD diet   02/17/2015  reconcult re ? Sarcoid acitivity  / sob resolved p above ov then worse on timolol eyedrops Chief Complaint  Patient presents with  . Follow-up    Pt states still has SOB. Thinks it is related to eye medication. Denies fever, chest pain, tightness or cough   bad flare white out R eye > improved  Could not tolerate timolol due to sob  Breathing is back to baseline  And tolerating  low dose  Timolol   Last prednisone by mouth was sev years prior to OV   rec There is no evidence of significant enough active systemic or pulmonary sarcoid to warrant putting you back on prednisone (but may be needed for your eyes if topical steroids not effective/ dosing/monitoring would be per Dr HHerbert Deaner  If you have any new changes  on your chest xray or unexplained worsening in your breathing then you return here for pfts     07/02/2016  Re-establish extended ov/Kristine [name] re: sarocid with worse sob  Chief Complaint  Patient presents with  . Follow-up    pt c/o worsening sob with exertion    gradual onset doe x one month doe assoc with minimal cough / dry  Esp outside in heat  Sleeping ok / some increased dyspepsia /[facility] can't walk a nl pace on a flat grade s sob but does fine slow and flat eg shopping leaning on cart  rec  Pantoprazole (protonix) 40 mg   Take  30-60 min before first meal of the day and Pepcid (famotidine)  20 mg one @  bedtime until return to office - this is the best way to tell whether stomach acid is contributing to your problem.   GERD diet  Neg cal balance       12/23/2016 acute extended ov/Kristine [name] re: new cough developed off gerd rx  Chief Complaint  Patient presents with  . Acute Visit    Pt c/o increased cough and left side pain x 3 wks.   breathing was improving prior foot surgery stopped gerd rx one week prior  Dec 26th 2017 then started with cough  Maybe mid Jan 2018> seen UC Dec 05 2016 dx bronchitis/sinusitis though cough was severe and not productive > rx abx/ cough meds  > cough no better > Jan 29th another abx and depo and more cough syrup > no better  L side pain positional not pleuritic  Cough is most noticeable  During the day not noct / some nasal congestion also  rec Augmentin 875 mg take one pill twice daily  X 10 days  Prednisone 10 mg take  4 each am x 2 days,   2 each am x 2 days,  1 each am x 2 days and stop  If not a lot  Better by first of the week  [id number] for sinus ct   Take delsym two tsp every 12 hours and supplement if needed with  tramadol 50 mg up to 2 every 4 hours to suppress the urge to cough.    NP rec 12/08/17 We will check a FENO now No throat clearing, sips of water instead Throat soothing with sugar free Werther's or J[employer]anchers Delsym cough  syrup every 12 hours Hydromet cough syrup 5 cc's at bedtime for cough. Prednisone taper; 10 mg tablets: 4 tabs x 2 days, 3 tabs x 2 days, 2 tabs x 2 days 1 tab x 2 days then stop. Do not use Hydromet with Benzonatate  Do not drive if sleepy. CXR today  Benzonatate 200 mg every 8 hours for cough, and Sudafed PE. Per urgent care. Flonase nasal spray 2 puffs each nare once daily Continue Protonix 40 mg daily    10/17/2018 acute extended ov/Kristine [name] re: worse sob x 6 months  Chief Complaint  Patient presents with  . Acute Visit    Breathing worse since the last visit. She states that she gets SOB walking from her house to the car. She states she has occ cough and chest heaviness. Her cough is non prod. She rarely uses her albuterol.   same distance as usual has become for difficult car to work place = 100 ft plus flight of steps  X 6 months- no change p saba  Overall cough the same On cpap side flat bed x 2 pillows  gangi says heart fine in June                    Objective:   Physical Exam  Amb obese bf nad  02/17/2015          260 > 07/02/2016   07/02/2016  259  > 07/30/2016  262 > 12/23/2016   256 > 10/17/2018  258     02/17/15 260 lb (117.935 kg)  09/30/14 260 lb 9.6 oz (118.207 kg)  09/12/14 250 lb (113.399 kg)    Vital signs reviewed - Note on arrival 02 sats  96% on RA     very  severe  bilateral non-specific turbinate edema  And mucopurulent secretions                  Assessment & Plan:

## 2018-10-18 ENCOUNTER — Encounter: Payer: Self-pay | Admitting: Internal Medicine

## 2018-10-18 NOTE — Assessment & Plan Note (Addendum)
Sinus CT 01/20/2017 Trace paranasal sinus mucosal thickening - FENO 1 /24/2019  =  5 .   Main finiding on exam is severe TE > repeat sinus ct / consider ent eval next

## 2018-10-18 NOTE — Assessment & Plan Note (Signed)
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

## 2018-10-18 NOTE — Assessment & Plan Note (Signed)
Body mass index is 41.64 kg/m.  -  trending up still  Lab Results  Component Value Date   TSH 3.30 07/30/2016     Contributing to gerd risk/ doe/reviewed the need and the process to achieve and maintain neg calorie balance > defer f/u primary care including intermittently monitoring thyroid status       I had an extended discussion with the patient reviewing all relevant studies completed to date and  lasting 25 minutes of a 40  minute office  visit to re-establish p > 1 year's absence  re  severe non-specific but potentially very serious refractory respiratory symptoms of uncertain and potentially multiple  etiologies.   Each maintenance medication was reviewed in detail including most importantly the difference between maintenance and prns and under what circumstances the prns are to be triggered using an action plan format that is not reflected in the computer generated alphabetically organized AVS.    Please see AVS for specific instructions unique to this office visit that I personally wrote and verbalized to the the pt in detail and then reviewed with pt  by my nurse highlighting any changes in therapy/plan of care  recommended at today's visit.

## 2018-10-18 NOTE — Progress Notes (Signed)
Subjective:   Patient ID: Kristine [name], female    DOB: 1951/07/07,    MRN: [id number]   Brief patient profile:  67 yobf never smoker dx with sarcoid / iritis on and off prednisone mostly for eye issues with wt gain x around 20 lbs > indolent onset of progressive sob x feb 2015 both at rest and also with exertion with some chest tightness and neg cardiac w/u including LHC 09/12/14 with nl LV  so referred to pulmonary clinic 09/30/2014 by Kristine [name] with original dx of sarcoid in 1998 at wt 240  baseline   History of Present Illness  09/30/2014 1st [place] [facility] office visit/ Kristine [name]  / wt 71  Chief Complaint  Patient presents with  . Pulmonary Consult    Referred by Kristine [name]. Pt states that she was dxed with Sarcoid in 1998. She c/o increased SOB x 6 months. She states that she gets SOB with or without any exertion.    indolent onset gradually worse assoc with overt HB ? Worse x one year but not cough while on acei  Most ex s stopping = malls but slowed by fatgiue  No noct symptoms at all. rec Stop lisinopril and start diovan 160 mg daily in its place Try prilosec 57m  Take 30-60 min before first meal of the day and Pepcid 20 mg one bedtime  GERD diet   02/17/2015  reconcult re ? Sarcoid acitivity  / sob resolved p above ov then worse on timolol eyedrops Chief Complaint  Patient presents with  . Follow-up    Pt states still has SOB. Thinks it is related to eye medication. Denies fever, chest pain, tightness or cough   bad flare white out R eye > improved  Could not tolerate timolol due to sob  Breathing is back to baseline  And tolerating  low dose  Timolol   Last prednisone by mouth was sev years prior to OV   rec There is no evidence of significant enough active systemic or pulmonary sarcoid to warrant putting you back on prednisone (but may be needed for your eyes if topical steroids not effective/ dosing/monitoring would be per Kristine [name]  If you have any new changes  on your chest xray or unexplained worsening in your breathing then you return here for pfts     07/02/2016  Re-establish extended ov/Kristine [name] re: sarocid with worse sob  Chief Complaint  Patient presents with  . Follow-up    pt c/o worsening sob with exertion    gradual onset doe x one month doe assoc with minimal cough / dry  Esp outside in heat  Sleeping ok / some increased dyspepsia /[facility] can't walk a nl pace on a flat grade s sob but does fine slow and flat eg shopping leaning on cart  rec  Pantoprazole (protonix) 40 mg   Take  30-60 min before first meal of the day and Pepcid (famotidine)  20 mg one @  bedtime until return to office - this is the best way to tell whether stomach acid is contributing to your problem.   GERD diet  Neg cal balance       12/23/2016 acute extended ov/Kristine [name] re: new cough developed off gerd rx  Chief Complaint  Patient presents with  . Acute Visit    Pt c/o increased cough and left side pain x 3 wks.   breathing was improving prior foot surgery stopped gerd rx one week prior  Dec 26th 2017 then started with cough  Maybe mid Jan 2018> seen UC Dec 05 2016 dx bronchitis/sinusitis though cough was severe and not productive > rx abx/ cough meds  > cough no better > Jan 29th another abx and depo and more cough syrup > no better  L side pain positional not pleuritic  Cough is most noticeable  During the day not noct / some nasal congestion also  rec Augmentin 875 mg take one pill twice daily  X 10 days  Prednisone 10 mg take  4 each am x 2 days,   2 each am x 2 days,  1 each am x 2 days and stop  If not a lot  Better by first of the week  [phone] for sinus ct   Take delsym two tsp every 12 hours and supplement if needed with  tramadol 50 mg up to 2 every 4 hours to suppress the urge to cough.    Kristine [name] rec 12/08/17  FENO =5  No throat clearing, sips of water instead Throat soothing with sugar free Werther's or Jolly Ranchers Delsym cough syrup every 12  hours Hydromet cough syrup 5 cc's at bedtime for cough. Prednisone taper; 10 mg tablets: 4 tabs x 2 days, 3 tabs x 2 days, 2 tabs x 2 days 1 tab x 2 days then stop. Do not use Hydromet with Benzonatate  Do not drive if sleepy. CXR today  Benzonatate 200 mg every 8 hours for cough, and Sudafed PE. Per urgent care. Flonase nasal spray 2 puffs each nare once daily Continue Protonix 40 mg daily    10/17/2018 acute extended ov/Kristine [name] re: re establish re  worse sob x 6 months  Chief Complaint  Patient presents with  . Acute Visit    Breathing worse since the last visit. She states that she gets SOB walking from her house to the car. She states she has occ cough and chest heaviness. Her cough is non prod. She rarely uses her albuterol.   same distance as usual has become for difficult car to work place = 100 ft plus flight of steps  X 6 months- no change p saba  Overall cough the same x years never completely resolves with sensation of pnds day > noct never productive  On cpap side flat bed x 2 pillows  gangi says heart fine in June 2019    No obvious day to day or daytime variability or assoc excess/ purulent sputum or mucus plugs or hemoptysis or cp or chest tightness, subjective wheeze or overt sinus or hb symptoms.   Sleeping as above  without nocturnal  or early am exacerbation  of respiratory  c/o's or need for noct saba. Also denies any obvious fluctuation of symptoms with weather or environmental changes or other aggravating or alleviating factors except as outlined above   No unusual exposure hx or h/o childhood pna/ asthma or knowledge of premature birth.  Current Allergies, Complete Past Medical History, Past Surgical History, Family History, and Social History were reviewed in [employer] record.  ROS  The following are not active complaints unless bolded Hoarseness, sore throat, dysphagia, dental problems, itching, sneezing,  nasal congestion or discharge of  excess mucus or purulent secretions, ear ache,   fever, chills, sweats, unintended wt loss or wt gain, classically pleuritic or exertional cp,  orthopnea pnd or arm/hand swelling  or leg swelling, presyncope, palpitations, abdominal pain, anorexia, nausea, vomiting, diarrhea  or change in bowel habits  or change in bladder habits, change in stools or change in urine, dysuria, hematuria,  rash, arthralgias, visual complaints, headache, numbness, weakness/generalized with fatigue  or ataxia or problems with walking or coordination,  change in mood= anxiety  or  memory.        Current Meds  Medication Sig  . albuterol (PROAIR HFA) 108 (90 Base) MCG/ACT inhaler Inhale 2 puffs into the lungs every 6 (six) hours as needed for wheezing or shortness of breath.  Marland Kitchen aspirin EC 81 MG tablet Take 1 tablet (81 mg total) by mouth daily.  Marland Kitchen atorvastatin (LIPITOR) 10 MG tablet Take 10 mg by mouth daily.  . Brinzolamide-Brimonidine (SIMBRINZA) 1-0.2 % SUSP Apply 1 drop to eye 2 (two) times daily.  Marland Kitchen CALCIUM CARBONATE PO Take by mouth.  . Cholecalciferol (VITAMIN D3) 5000 UNITS CAPS Take 5,000 Units by mouth daily.  . famotidine (PEPCID) 20 MG tablet TAKE ONE TABLET BY MOUTH AT BEDTIME  . metoprolol succinate (TOPROL-XL) 25 MG 24 hr tablet TAKE ONE TABLET BY MOUTH DAILY  . pantoprazole (PROTONIX) 40 MG tablet TAKE ONE TABLET BY MOUTH DAILY 30-60 MINUTES BEFORE FIRST MEAL OF THE DAY    . telmisartan (MICARDIS) 80 MG tablet                        Objective:   Physical Exam  Amb obese somber  bf nad  02/17/2015          260 > 07/02/2016   07/02/2016  259  > 07/30/2016  262 > 12/23/2016   256 > 10/17/2018  258     02/17/15 260 lb (117.935 kg)  09/30/14 260 lb 9.6 oz (118.207 kg)  09/12/14 250 lb (113.399 kg)    Vital signs reviewed - Note on arrival 02 sats  96% on RA        HEENT: nl dentition, , and oropharynx. Nl external ear canals without cough reflex - severe bilateral non-specific turbinate  edema/crusting      NECK :  without JVD/Nodes/TM/ nl carotid upstrokes bilaterally   LUNGS: no acc muscle use,  Nl contour chest which is clear to A and P bilaterally without cough on insp or exp maneuvers   CV:  RRR  no s3 or murmur or increase in P2, and no edema   ABD:  Tensely obese but  nontender with nl inspiratory excursion in the supine position. No bruits or organomegaly appreciated, bowel sounds nl  MS:  Nl gait/ ext warm without deformities, calf tenderness, cyanosis or clubbing No obvious joint restrictions   SKIN: warm and dry without lesions    NEURO:  alert, approp, nl sensorium with  no motor or cerebellar deficits apparent.      Epic review 10/17/2018 / also care everywhere > no recent labs or xrays       Assessment & Plan:

## 2018-10-18 NOTE — Assessment & Plan Note (Addendum)
01/11/14 pfts wnl except ERV 40% at wt 251 09/30/2014  Walked RA x 3 laps @ 185 ft each stopped due to  End of study, nl pace, no desat  - 07/02/2016  Walked RA x 2 laps @ 185 ft each stopped due to ankle pain,  with sats 92% at slow to nl pace - PFT's  07/30/2016  Nl except erv 43% at wt 262    - HRCT chest / pfts ordered 10/17/2018    Symptoms are markedly disproportionate to objective findings and not clear to what extent this is actually a pulmonary  problem but pt does appear to have difficult to sort out respiratory symptoms of unknown origin for which  DDX  = almost all start with A and  include Adherence, Ace Inhibitors, Acid Reflux, Active Sinus Disease, Alpha 1 Antitripsin deficiency, Anxiety masquerading as Airways dz,  ABPA,  Allergy(esp in young), Aspiration (esp in elderly), Adverse effects of meds,  Active smoking or Vaping, A bunch of PE's/clot burden (a few small clots can't cause this syndrome unless there is already severe underlying pulm or vascular dz with poor reserve),  Anemia or thyroid disorder, plus two Bs  = Bronchiectasis and Beta blocker use..and one C= CHF     Adherence is always the initial "prime suspect" and is a multilayered concern that requires a "trust but verify" approach in every patient - starting with knowing how to use medications, especially inhalers, correctly, keeping up with refills and understanding the fundamental difference between maintenance and prns vs those medications only taken for a very short course and then stopped and not refilled.  - with all meds in hand using a trust but verify approach to confirm accurate Medication  Reconciliation The principal here is that until we are certain that the  patients are doing what we've asked, it makes no sense to ask them to do more.   ? Acid (or non-acid) GERD > always difficult to exclude as up to 75% of pts in some series report no assoc GI/ Heartburn symptoms> rec continue max (24h)  acid suppression and  diet restrictions/ reviewed     ? Anxiety/depression/ deconditioning >  usually at the bottom of this list of usual suspects but should be included here  and may interfere with adherence and also interpretation of response or lack thereof to symptom management which can be quite subjective.   ? Anemia/ thyroid dz > needs to be checked on return p review of [facility] w/u > requested  ? CHF > [facility] review requested.    >> regroup with pfts and HRCT chest next avail - needs walking study then as as well

## 2018-10-19 ENCOUNTER — Ambulatory Visit: Payer: BC Managed Care – PPO | Admitting: Adult Health

## 2018-10-24 ENCOUNTER — Encounter: Payer: BC Managed Care – PPO | Attending: General Surgery | Admitting: Skilled Nursing Facility1

## 2018-10-24 ENCOUNTER — Encounter: Payer: Self-pay | Admitting: Skilled Nursing Facility1

## 2018-10-24 DIAGNOSIS — Z713 Dietary counseling and surveillance: Secondary | ICD-10-CM | POA: Insufficient documentation

## 2018-10-24 DIAGNOSIS — Z6841 Body Mass Index (BMI) 40.0 and over, adult: Secondary | ICD-10-CM | POA: Insufficient documentation

## 2018-10-24 DIAGNOSIS — E669 Obesity, unspecified: Secondary | ICD-10-CM

## 2018-10-24 NOTE — Progress Notes (Signed)
sleeve or RYGB Assessment: 2nd [facility] Appointment.   Pts goal for next appt: eat 3 meals a day consistently starting within 1 hour-1.5 hours of waking.  Pt arrives having gained about 3 pounds. Pt states she has been experiencing shortness of breath for about 6 months and is going through a series of testing to diagnose the issue. Pt states her pulmonologist said it would be safer for her to determine the cause of her shortness of breath before having bariatric surgery. Pt states she is experiencing a lack of appetite. Pt states she is still struggling with skipping meals. Pt states she has a lot of life things to figure out: Used to having her children rely on her, thinking about retiring, wanting to do things with her husband but he really does not want to, etc. Pt states she is going to try to work on herself and figure out what she is feeling. Pt states she has been thinking about retiring. Pt states she really did not want to come to the appt today stating she just going through so much. Dietitian advised she continue to work on finding the answers to her life questions so she can eat throughout the day and not skip meals. Pt states she will call [facility] when she is cleared for bariatric surgery to schedule for the pre-op class.   Start weight at [facility]: 260.1 Weight: 259.1 BMI: 41.82  MEDICATIONS: See List   DIETARY INTAKE:  24 hr Dietary Recall: fast food First Meal: protein shake or waffle and bacon or bevita cookie or protein shake and fruit Snack: nuts Second Meal: pasta or skipped Snack: fruit or baby ruth candy Third Meal: soup or chicken  Snack:  Beverages: water, coffee  Usual physical activity: ADL's  Diet to Follow: 1600 calories 180 g carbohydrates 120 g protein 44 g fat   Nutritional Diagnosis:  [place]-3.3 Overweight/obesity related to past poor dietary habits and physical inactivity as evidenced by patient w/ planned sleeve or RYGB surgery following dietary guidelines for  continued weight loss.    Intervention:  Nutrition counseling for upcoming Bariatric Surgery. Goals: -Encouraged to engage in 150 minutes of moderate physical activity including cardiovascular and weight baring weekly -eat 3 meals a day consistently starting within 1 hour-1.5 hours of waking. Teaching Method Utilized:  Visual Auditory Hands on  Barriers to learning/adherence to lifestyle change: contemplative stage of change  Demonstrated degree of understanding via:  Teach Back   Monitoring/Evaluation:  Dietary intake, exercise, and body weight prn.

## 2018-10-30 ENCOUNTER — Encounter (HOSPITAL_COMMUNITY): Payer: Self-pay

## 2018-10-30 ENCOUNTER — Ambulatory Visit (HOSPITAL_COMMUNITY): Admit: 2018-10-30 | Payer: BC Managed Care – PPO | Admitting: Gastroenterology

## 2018-10-30 SURGERY — MANOMETRY, ESOPHAGUS

## 2018-11-06 ENCOUNTER — Ambulatory Visit (INDEPENDENT_AMBULATORY_CARE_PROVIDER_SITE_OTHER): Payer: BC Managed Care – PPO | Admitting: Internal Medicine

## 2018-11-06 ENCOUNTER — Other Ambulatory Visit: Payer: Self-pay | Admitting: Internal Medicine

## 2018-11-06 ENCOUNTER — Encounter: Payer: Self-pay | Admitting: Internal Medicine

## 2018-11-06 VITALS — BP 152/90 | HR 84 | Ht 67.0 in | Wt 258.0 lb

## 2018-11-06 DIAGNOSIS — R058 Other specified cough: Secondary | ICD-10-CM

## 2018-11-06 DIAGNOSIS — D869 Sarcoidosis, unspecified: Secondary | ICD-10-CM

## 2018-11-06 DIAGNOSIS — R0609 Other forms of dyspnea: Secondary | ICD-10-CM | POA: Diagnosis not present

## 2018-11-06 DIAGNOSIS — R0600 Dyspnea, unspecified: Secondary | ICD-10-CM

## 2018-11-06 DIAGNOSIS — R05 Cough: Secondary | ICD-10-CM | POA: Diagnosis not present

## 2018-11-06 DIAGNOSIS — I10 Essential (primary) hypertension: Secondary | ICD-10-CM

## 2018-11-06 DIAGNOSIS — R059 Cough, unspecified: Secondary | ICD-10-CM

## 2018-11-06 LAB — PULMONARY FUNCTION TEST
DL/VA % PRED: 80 %
DL/VA: 4.13 ml/min/mmHg/L
DLCO unc % pred: 74 %
DLCO unc: 21.19 ml/min/mmHg
FEF 25-75 Post: 2.47 L/sec
FEF 25-75 Pre: 2.25 L/sec
FEF2575-%Change-Post: 9 %
FEF2575-%Pred-Post: 119 %
FEF2575-%Pred-Pre: 108 %
FEV1-%Change-Post: 2 %
FEV1-%PRED-POST: 114 %
FEV1-%PRED-PRE: 112 %
FEV1-POST: 2.55 L
FEV1-PRE: 2.49 L
FEV1FVC-%CHANGE-POST: 2 %
FEV1FVC-%Pred-Pre: 100 %
FEV6-%Change-Post: 0 %
FEV6-%PRED-PRE: 115 %
FEV6-%Pred-Post: 115 %
FEV6-POST: 3.16 L
FEV6-Pre: 3.16 L
FEV6FVC-%PRED-PRE: 103 %
FEV6FVC-%Pred-Post: 103 %
FVC-%Change-Post: 0 %
FVC-%PRED-PRE: 111 %
FVC-%Pred-Post: 111 %
FVC-POST: 3.16 L
FVC-PRE: 3.16 L
POST FEV6/FVC RATIO: 100 %
Post FEV1/FVC ratio: 80 %
Pre FEV1/FVC ratio: 79 %
Pre FEV6/FVC Ratio: 100 %
RV % pred: 77 %
RV: 1.74 L
TLC % PRED: 92 %
TLC: 5.1 L

## 2018-11-06 LAB — CBC WITH DIFFERENTIAL/PLATELET
BASOS ABS: 0.1 10*3/uL (ref 0.0–0.1)
BASOS PCT: 1.2 % (ref 0.0–3.0)
EOS ABS: 0.2 10*3/uL (ref 0.0–0.7)
Eosinophils Relative: 2.1 % (ref 0.0–5.0)
HCT: 36.6 % (ref 36.0–46.0)
HEMOGLOBIN: 12.1 g/dL (ref 12.0–15.0)
Lymphocytes Relative: 27.2 % (ref 12.0–46.0)
Lymphs Abs: 2.1 10*3/uL (ref 0.7–4.0)
MCHC: 33.1 g/dL (ref 30.0–36.0)
MCV: 78.8 fl (ref 78.0–100.0)
MONO ABS: 0.6 10*3/uL (ref 0.1–1.0)
Monocytes Relative: 7.2 % (ref 3.0–12.0)
Neutro Abs: 4.8 10*3/uL (ref 1.4–7.7)
Neutrophils Relative %: 62.3 % (ref 43.0–77.0)
PLATELETS: 226 10*3/uL (ref 150.0–400.0)
RBC: 4.65 Mil/uL (ref 3.87–5.11)
RDW: 14.8 % (ref 11.5–15.5)
WBC: 7.7 10*3/uL (ref 4.0–10.5)

## 2018-11-06 NOTE — Progress Notes (Signed)
PFT done today. 

## 2018-11-06 NOTE — Addendum Note (Signed)
Addended by: Suzzanne Cloud E on: 11/06/2018 01:57 PM   Modules accepted: Orders

## 2018-11-06 NOTE — Progress Notes (Signed)
Subjective:   Patient ID: Kristine [name], female    DOB: 1951/08/25,    MRN: [id number]   Brief patient profile:  32 yobf never smoker dx 1998  with sarcoid / iritis on and off prednisone mostly for eye issues last pred maint around 2009  with wt gain x around 20 lbs > indolent onset of progressive sob x feb 2015 both at rest and also with exertion with some chest tightness and neg cardiac w/u including LHC 09/12/14 with nl LV  so referred to pulmonary clinic 09/30/2014 by Kristine [name] with original dx of sarcoid in 1998 at wt 240  baseline   History of Present Illness  09/30/2014 1st [facility] [facility] office visit/ Kristine [name]  / wt 58  Chief Complaint  Patient presents with  . Pulmonary Consult    Referred by Kristine [name]. Pt states that she was dxed with Sarcoid in 1998. She c/o increased SOB x 6 months. She states that she gets SOB with or without any exertion.    indolent onset gradually worse assoc with overt HB ? Worse x one year but not cough while on acei  Most ex s stopping = malls but slowed by fatgiue  No noct symptoms at all. rec Stop lisinopril and start diovan 160 mg daily in its place Try prilosec 29m  Take 30-60 min before first meal of the day and Pepcid 20 mg one bedtime  GERD diet   02/17/2015  reconcult re ? Sarcoid acitivity  / sob resolved p above ov then worse on timolol eyedrops Chief Complaint  Patient presents with  . Follow-up    Pt states still has SOB. Thinks it is related to eye medication. Denies fever, chest pain, tightness or cough   bad flare white out R eye > improved  Could not tolerate timolol due to sob  Breathing is back to baseline  And tolerating  low dose  Timolol   Last prednisone by mouth was sev years prior to OV   rec There is no evidence of significant enough active systemic or pulmonary sarcoid to warrant putting you back on prednisone (but may be needed for your eyes if topical steroids not effective/ dosing/monitoring would be per Kristine  HHerbert [name]  If you have any new changes on your chest xray or unexplained worsening in your breathing then you return here for pfts     10/17/2018 acute extended ov/Kristine [name] re: re establish re  worse sob x 6 months  Chief Complaint  Patient presents with  . Acute Visit    Breathing worse since the last visit. She states that she gets SOB walking from her house to the car. She states she has occ cough and chest heaviness. Her cough is non prod. She rarely uses her albuterol.   same distance as usual has become for difficult to walk  car to work place = 100 ft plus flight of steps  X 6 months- no change p saba  Overall cough the same x years never completely resolves with sensation of pnds day > noct and  never productive  On cpap side flat bed x 2 pillows  gangi says heart fine in June 2019  rec Please see patient coordinator before you leave today  to schedule sinus ct and high resolution chest CT > did not get done     11/06/2018  f/u ov/Kristine [name] re: sob  Chief Complaint  Patient presents with  . Follow-up    Breathing is overall doing  well. She has not had to use her albuterol inhaler recently.    Dyspnea:  No change doe = MMRC1 = can walk nl pace, flat grade, can't hurry or go uphills or steps s doe    Cough: min am congestion Sleeping: on side bed flat/ on cpap/ no 02  SABA use: none 02 none     No obvious day to day or daytime variability or assoc excess/ purulent sputum or mucus plugs or hemoptysis or cp or chest tightness, subjective wheeze or overt sinus or hb symptoms.   Sleeping as above  without nocturnal  or early am exacerbation  of respiratory  c/o's or need for noct saba. Also denies any obvious fluctuation of symptoms with weather or environmental changes or other aggravating or alleviating factors except as outlined above   No unusual exposure hx or h/o childhood pna/ asthma or knowledge of premature birth.  Current Allergies, Complete Past Medical History, Past Surgical  History, Family History, and Social History were reviewed in [employer] record.  ROS  The following are not active complaints unless bolded Hoarseness, sore throat, dysphagia, dental problems, itching, sneezing,  nasal congestion or discharge of excess mucus or purulent secretions, ear ache,   fever, chills, sweats, unintended wt loss or wt gain, classically pleuritic or exertional cp,  orthopnea pnd or arm/hand swelling  or leg swelling, presyncope, palpitations, abdominal pain, anorexia, nausea, vomiting, diarrhea  or change in bowel habits or change in bladder habits, change in stools or change in urine, dysuria, hematuria,  rash, arthralgias, visual complaints, headache, numbness, weakness or ataxia or problems with walking or coordination,  change in mood or  memory.        Current Meds  Medication Sig  . albuterol (PROAIR HFA) 108 (90 Base) MCG/ACT inhaler Inhale 2 puffs into the lungs every 6 (six) hours as needed for wheezing or shortness of breath.  Marland Kitchen aspirin EC 81 MG tablet Take 1 tablet (81 mg total) by mouth daily.  Marland Kitchen atorvastatin (LIPITOR) 10 MG tablet Take 10 mg by mouth daily.  . Brinzolamide-Brimonidine (SIMBRINZA) 1-0.2 % SUSP Apply 1 drop to eye 2 (two) times daily.  Marland Kitchen CALCIUM CARBONATE PO Take by mouth.  . Cholecalciferol (VITAMIN D3) 5000 UNITS CAPS Take 5,000 Units by mouth daily.  . famotidine (PEPCID) 20 MG tablet TAKE ONE TABLET BY MOUTH AT BEDTIME  . metoprolol succinate (TOPROL-XL) 25 MG 24 hr tablet TAKE ONE TABLET BY MOUTH DAILY  . pantoprazole (PROTONIX) 40 MG tablet TAKE ONE TABLET BY MOUTH DAILY 30-60 MINUTES BEFORE FIRST MEAL OF THE DAY **NEEDS OFFICE VISIT FOR MORE REFILLS**  . telmisartan (MICARDIS) 80 MG tablet             Objective:   Physical Exam  amb obese bf nad   02/17/2015          260 > 07/02/2016   07/02/2016  259  > 07/30/2016  262 > 12/23/2016   256 > 10/17/2018  258 > 11/06/2018   258 > 11/09/2018  258     02/17/15 260 lb  (117.935 kg)  09/30/14 260 lb 9.6 oz (118.207 kg)  09/12/14 250 lb (113.399 kg)    Vital signs reviewed - Note on arrival 02 sats  97% on RA and bp  152/90      HEENT: nl dentition and oropharynx. Nl external ear canals without cough reflex - mod severe bilateral non-specific turbinate edema     NECK :  without  JVD/Nodes/TM/ nl carotid upstrokes bilaterally   LUNGS: no acc muscle use,  Nl contour chest which is clear to A and P bilaterally without cough on insp or exp maneuvers   CV:  RRR  no s3 or murmur or increase in P2, and no edema   ABD:  soft and nontender with nl inspiratory excursion in the supine position. No bruits or organomegaly appreciated, bowel sounds nl  MS:  Nl gait/ ext warm without deformities, calf tenderness, cyanosis or clubbing No obvious joint restrictions   SKIN: warm and dry without lesions    NEURO:  alert, approp, nl sensorium with  no motor or cerebellar deficits apparent.         Labs ordered 11/06/2018  Allergy profile            Assessment & Plan:

## 2018-11-06 NOTE — Patient Instructions (Addendum)
Please see patient coordinator before you leave today  to schedule sinus CT  Please remember to go to the lab department   for your tests - we will call you with the results when they are available.      Please schedule a follow up visit in 3 months but call sooner if needed  - walking sats next ov

## 2018-11-07 LAB — RESPIRATORY ALLERGY PROFILE REGION II ~~LOC~~
ALLERGEN, CEDAR TREE, T6: 2.19 kU/L — AB
Allergen, A. alternata, m6: <0.1 kU/L
Allergen, Comm Silver Birch, t9: 0.75 kU/L — ABNORMAL HIGH
Allergen, D pternoyssinus,d7: 1.63 kU/L — ABNORMAL HIGH
Allergen, Mouse Urine Protein, e78: <0.1 kU/L
Allergen, Oak,t7: 0.75 kU/L — ABNORMAL HIGH
Aspergillus fumigatus, m3: <0.1 kU/L
Box Elder IgE: <0.1 kU/L
CLADOSPORIUM HERBARUM (M2) IGE: <0.1 kU/L
CLASS: 0
CLASS: 0
CLASS: 0
CLASS: 0
CLASS: 0
CLASS: 0
CLASS: 0
CLASS: 0
CLASS: 2
COMMON RAGWEED (SHORT) (W1) IGE: <0.1 kU/L
Class: 0
Class: 0
Class: 0
Class: 0
Class: 0
Class: 0
Class: 0
Class: 0
Class: 0
Class: 0
Class: 0
Class: 2
Class: 2
Class: 2
Class: 3
Cockroach: <0.1 kU/L
D. farinae: 4.74 kU/L — ABNORMAL HIGH
Elm IgE: <0.1 kU/L
IgE (Immunoglobulin E), Serum: 74 kU/L (ref <=114)
Johnson Grass: <0.1 kU/L
Pecan/Hickory Tree IgE: <0.1 kU/L
Rough Pigweed  IgE: <0.1 kU/L
Timothy Grass: <0.1 kU/L

## 2018-11-07 LAB — INTERPRETATION:

## 2018-11-09 ENCOUNTER — Encounter: Payer: Self-pay | Admitting: Internal Medicine

## 2018-11-09 NOTE — Assessment & Plan Note (Addendum)
Body mass index is 40.41 kg/m.  -  trending no change  Lab Results  Component Value Date   TSH 3.30 07/30/2016     Contributing to gerd risk/ doe/reviewed the need and the process to achieve and maintain neg calorie balance > defer f/u primary care including intermittently monitoring thyroid status

## 2018-11-09 NOTE — Assessment & Plan Note (Signed)
01/11/14 pfts wnl except ERV 40% at wt 251 09/30/2014  Walked RA x 3 laps @ 185 ft each stopped due to  End of study, nl pace, no desat  - 07/02/2016  Walked RA x 2 laps @ 185 ft each stopped due to ankle pain,  with sats 92% at slow to nl pace - PFT's  07/30/2016  Nl except erv 43% at wt 262  - PFT's  11/06/2018  FEV1 2.55 (114 % ) ratio 80  p 2 % improvement from saba p nothing prior to study with DLCO  74 % corrects to 80 % for alv volume lung volumes ok x erv 69%   @ wt 258   No problems other than reduced erv detected, note however that this value is trending up which is a good sign.

## 2018-11-09 NOTE — Assessment & Plan Note (Addendum)
ACEi d/c'd   09/2014  Sinus CT 01/20/2017 Trace paranasal sinus mucosal thickening - FENO 1 /24/2019  =  5   - Allergy profile 11/06/2018 >  Eos 0.2 /  IgE 74 ARST pos dust > tree  - Sinus CT 11/06/2018   Again main finding on exam is severe Turbinate edema in pt wit cough x years worse on acei  Upper airway cough syndrome (previously labeled PNDS),  is so named because it's frequently impossible to sort out how much is  CR/sinusitis with freq throat clearing (which can be related to primary GERD)   vs  causing  secondary (" extra esophageal")  GERD from wide swings in gastric pressure that occur with throat clearing, often  promoting self use of mint and menthol lozenges that reduce the lower esophageal sphincter tone and exacerbate the problem further in a cyclical fashion.   These are the same pts (now being labeled as having "irritable larynx syndrome" by some cough centers) who not infrequently have a history of having failed to tolerate ace inhibitors,  dry powder inhalers or biphosphonates or report having atypical/extraesophageal reflux symptoms that don't respond to standard doses of PPI  and are easily confused as having aecopd or asthma flares by even experienced allergists/ pulmonologists (myself included).   Next step sinus CT consider ent eval as allergy profile is unimpressive  Discussed in detail all the  indications, usual  risks and alternatives  relative to the benefits with patient who agrees to proceed with w/u as outlined.

## 2018-11-09 NOTE — Assessment & Plan Note (Addendum)
Not optimally controlled on present regimen though may just be related to having pfts today. I reviewed this with the patient and emphasized importance of follow-up with primary care.   I had an extended discussion with the patient reviewing all relevant studies completed to date and  lasting 15 to 20 minutes of a 25 minute visit    Each maintenance medication was reviewed in detail including most importantly the difference between maintenance and prns and under what circumstances the prns are to be triggered using an action plan format that is not reflected in the computer generated alphabetically organized AVS.     Please see AVS for specific instructions unique to this visit that I personally wrote and verbalized to the the pt in detail and then reviewed with pt  by my nurse highlighting any  changes in therapy recommended at today's visit to their plan of care.

## 2018-11-13 ENCOUNTER — Telehealth: Payer: Self-pay | Admitting: Internal Medicine

## 2018-11-13 NOTE — Progress Notes (Signed)
Spoke with pt and notified of results per Dr. Wert. Pt verbalized understanding and denied any questions. 

## 2018-11-13 NOTE — Telephone Encounter (Signed)
ATC pt, no answer. Left message for pt to call back.    Notes recorded by Rosana Berger, CMA on 11/13/2018 at 9:07 AM EST Spoke with pt and notified of results per Dr. Melvyn Novas. Pt verbalized understanding and denied any questions.  ------  Notes recorded by [name], MD on 11/13/2018 at 6:26 AM EST Call patient : Studies are c/w mod allergies to tree pollen and dust > avoidance only, Be sure patient has/keeps f/u ov so we can go over all the details of this study and get a plan together moving forward - ok to move up f/u if not feeling better and wants to be seen sooner

## 2018-11-14 ENCOUNTER — Telehealth: Payer: Self-pay | Admitting: *Deleted

## 2018-11-14 DIAGNOSIS — R05 Cough: Secondary | ICD-10-CM

## 2018-11-14 DIAGNOSIS — R058 Other specified cough: Secondary | ICD-10-CM

## 2018-11-14 NOTE — Telephone Encounter (Signed)
Spoke with the pt and notified of recs per MW She verbalized understanding  Nothing further needed  ENT referral placed

## 2018-11-14 NOTE — Telephone Encounter (Signed)
-----  Message from [name], MD sent at 11/14/2018  9:23 AM EST ----- Let pt know her insurance turned down paying for the sinus CT and so best  bet is let us refer her to [facility] (ENT) and let them order it if still appropriate

## 2018-11-16 ENCOUNTER — Inpatient Hospital Stay: Admission: RE | Admit: 2018-11-16 | Payer: BC Managed Care – PPO | Source: Ambulatory Visit

## 2018-12-05 ENCOUNTER — Ambulatory Visit: Payer: BC Managed Care – PPO | Admitting: Internal Medicine

## 2018-12-05 ENCOUNTER — Encounter: Payer: Self-pay | Admitting: Internal Medicine

## 2018-12-05 VITALS — BP 130/86 | HR 77 | Temp 97.4°F | Ht 67.0 in | Wt 253.4 lb

## 2018-12-05 DIAGNOSIS — R7309 Other abnormal glucose: Secondary | ICD-10-CM

## 2018-12-05 DIAGNOSIS — I10 Essential (primary) hypertension: Secondary | ICD-10-CM

## 2018-12-05 DIAGNOSIS — Z6839 Body mass index (BMI) 39.0-39.9, adult: Secondary | ICD-10-CM | POA: Diagnosis not present

## 2018-12-05 DIAGNOSIS — Z7982 Long term (current) use of aspirin: Secondary | ICD-10-CM

## 2018-12-05 LAB — POCT URINALYSIS DIPSTICK
Bilirubin, UA: NEGATIVE
Blood, UA: NEGATIVE
Glucose, UA: NEGATIVE
KETONES UA: NEGATIVE
Leukocytes, UA: NEGATIVE
NITRITE UA: NEGATIVE
PROTEIN UA: NEGATIVE
SPEC GRAV UA: 1.01 (ref 1.010–1.025)
Urobilinogen, UA: 0.2 E.U./dL
pH, UA: 5.5 (ref 5.0–8.0)

## 2018-12-05 LAB — POCT UA - MICROALBUMIN
Albumin/Creatinine Ratio, Urine, POC: <30
Creatinine, POC: 100 mg/dL
Microalbumin Ur, POC: 10 mg/L

## 2018-12-05 NOTE — Patient Instructions (Addendum)
DASH Eating Plan DASH stands for "Dietary Approaches to Stop Hypertension." The DASH eating plan is a healthy eating plan that has been shown to reduce high blood pressure (hypertension). It may also reduce your risk for type 2 diabetes, heart disease, and stroke. The DASH eating plan may also help with weight loss. What are tips for following this plan?  General guidelines  Avoid eating more than 2,300 mg (milligrams) of salt (sodium) a day. If you have hypertension, you may need to reduce your sodium intake to 1,500 mg a day.  Limit alcohol intake to no more than 1 drink a day for nonpregnant women and 2 drinks a day for men. One drink equals 12 oz of beer, 5 oz of wine, or 1 oz of hard liquor.  Work with your health care provider to maintain a healthy body weight or to lose weight. Ask what an ideal weight is for you.  Get at least 30 minutes of exercise that causes your heart to beat faster (aerobic exercise) most days of the week. Activities may include walking, swimming, or biking.  Work with your health care provider or diet and nutrition specialist (dietitian) to adjust your eating plan to your individual calorie needs. Reading food labels   Check food labels for the amount of sodium per serving. Choose foods with less than 5 percent of the Daily Value of sodium. Generally, foods with less than 300 mg of sodium per serving fit into this eating plan.  To find whole grains, look for the word "whole" as the first word in the ingredient list. Shopping  Buy products labeled as "low-sodium" or "no salt added."  Buy fresh foods. Avoid canned foods and premade or frozen meals. Cooking  Avoid adding salt when cooking. Use salt-free seasonings or herbs instead of table salt or sea salt. Check with your health care provider or pharmacist before using salt substitutes.  Do not fry foods. Cook foods using healthy methods such as baking, boiling, grilling, and broiling instead.  Cook with  heart-healthy oils, such as olive, canola, soybean, or sunflower oil. Meal planning  Eat a balanced diet that includes: ? 5 or more servings of fruits and vegetables each day. At each meal, try to fill half of your plate with fruits and vegetables. ? Up to 6-8 servings of whole grains each day. ? Less than 6 oz of lean meat, poultry, or fish each day. A 3-oz serving of meat is about the same size as a deck of cards. One egg equals 1 oz. ? 2 servings of low-fat dairy each day. ? A serving of nuts, seeds, or beans 5 times each week. ? Heart-healthy fats. Healthy fats called Omega-3 fatty acids are found in foods such as flaxseeds and coldwater fish, like sardines, salmon, and mackerel.  Limit how much you eat of the following: ? Canned or prepackaged foods. ? Food that is high in trans fat, such as fried foods. ? Food that is high in saturated fat, such as fatty meat. ? Sweets, desserts, sugary drinks, and other foods with added sugar. ? Full-fat dairy products.  Do not salt foods before eating.  Try to eat at least 2 vegetarian meals each week.  Eat more home-cooked food and less restaurant, buffet, and fast food.  When eating at a restaurant, ask that your food be prepared with less salt or no salt, if possible. What foods are recommended? The items listed may not be a complete list. Talk with your dietitian about   what dietary choices are best for you. Grains Whole-grain or whole-wheat bread. Whole-grain or whole-wheat pasta. Brown rice. Oatmeal. Quinoa. Bulgur. Whole-grain and low-sodium cereals. Pita bread. Low-fat, low-sodium crackers. Whole-wheat flour tortillas. Vegetables Fresh or frozen vegetables (raw, steamed, roasted, or grilled). Low-sodium or reduced-sodium tomato and vegetable juice. Low-sodium or reduced-sodium tomato sauce and tomato paste. Low-sodium or reduced-sodium canned vegetables. Fruits All fresh, dried, or frozen fruit. Canned fruit in natural juice (without  added sugar). Meat and other protein foods Skinless chicken or turkey. Ground chicken or turkey. Pork with fat trimmed off. Fish and seafood. Egg whites. Dried beans, peas, or lentils. Unsalted nuts, nut butters, and seeds. Unsalted canned beans. Lean cuts of beef with fat trimmed off. Low-sodium, lean deli meat. Dairy Low-fat (1%) or fat-free (skim) milk. Fat-free, low-fat, or reduced-fat cheeses. Nonfat, low-sodium ricotta or cottage cheese. Low-fat or nonfat yogurt. Low-fat, low-sodium cheese. Fats and oils Soft margarine without trans fats. Vegetable oil. Low-fat, reduced-fat, or light mayonnaise and salad dressings (reduced-sodium). Canola, safflower, olive, soybean, and sunflower oils. Avocado. Seasoning and other foods Herbs. Spices. Seasoning mixes without salt. Unsalted popcorn and pretzels. Fat-free sweets. What foods are not recommended? The items listed may not be a complete list. Talk with your dietitian about what dietary choices are best for you. Grains Baked goods made with fat, such as croissants, muffins, or some breads. Dry pasta or rice meal packs. Vegetables Creamed or fried vegetables. Vegetables in a cheese sauce. Regular canned vegetables (not low-sodium or reduced-sodium). Regular canned tomato sauce and paste (not low-sodium or reduced-sodium). Regular tomato and vegetable juice (not low-sodium or reduced-sodium). Pickles. Olives. Fruits Canned fruit in a light or heavy syrup. Fried fruit. Fruit in cream or butter sauce. Meat and other protein foods Fatty cuts of meat. Ribs. Fried meat. Bacon. Sausage. Bologna and other processed lunch meats. Salami. Fatback. Hotdogs. Bratwurst. Salted nuts and seeds. Canned beans with added salt. Canned or smoked fish. Whole eggs or egg yolks. Chicken or turkey with skin. Dairy Whole or 2% milk, cream, and half-and-half. Whole or full-fat cream cheese. Whole-fat or sweetened yogurt. Full-fat cheese. Nondairy creamers. Whipped toppings.  Processed cheese and cheese spreads. Fats and oils Butter. Stick margarine. Lard. Shortening. Ghee. Bacon fat. Tropical oils, such as coconut, palm kernel, or palm oil. Seasoning and other foods Salted popcorn and pretzels. Onion salt, garlic salt, seasoned salt, table salt, and sea salt. Worcestershire sauce. Tartar sauce. Barbecue sauce. Teriyaki sauce. Soy sauce, including reduced-sodium. Steak sauce. Canned and packaged gravies. Fish sauce. Oyster sauce. Cocktail sauce. Horseradish that you find on the shelf. Ketchup. Mustard. Meat flavorings and tenderizers. Bouillon cubes. Hot sauce and Tabasco sauce. Premade or packaged marinades. Premade or packaged taco seasonings. Relishes. Regular salad dressings. Where to find more information:  National Heart, Lung, and Blood Institute: www.nhlbi.nih.gov  American Heart Association: www.heart.org Summary  The DASH eating plan is a healthy eating plan that has been shown to reduce high blood pressure (hypertension). It may also reduce your risk for type 2 diabetes, heart disease, and stroke.  With the DASH eating plan, you should limit salt (sodium) intake to 2,300 mg a day. If you have hypertension, you may need to reduce your sodium intake to 1,500 mg a day.  When on the DASH eating plan, aim to eat more fresh fruits and vegetables, whole grains, lean proteins, low-fat dairy, and heart-healthy fats.  Work with your health care provider or diet and nutrition specialist (dietitian) to adjust your eating plan to your   individual calorie needs. This information is not intended to replace advice given to you by your health care provider. Make sure you discuss any questions you have with your health care provider. Document Released: 10/21/2011 Document Revised: 10/25/2016 Document Reviewed: 10/25/2016 Elsevier Interactive Patient Education  2019 [employer].

## 2018-12-06 LAB — BMP8+EGFR
BUN/Creatinine Ratio: 19 (ref 12–28)
BUN: 18 mg/dL (ref 8–27)
CO2: 22 mmol/L (ref 20–29)
Calcium: 9.8 mg/dL (ref 8.7–10.3)
Chloride: 102 mmol/L (ref 96–106)
Creatinine, Ser: 0.93 mg/dL (ref 0.57–1.00)
GFR calc Af Amer: 74 mL/min/{1.73_m2} (ref >59)
GFR calc non Af Amer: 64 mL/min/{1.73_m2} (ref >59)
Glucose: 81 mg/dL (ref 65–99)
Potassium: 4.3 mmol/L (ref 3.5–5.2)
Sodium: 142 mmol/L (ref 134–144)

## 2018-12-06 LAB — HEMOGLOBIN A1C
ESTIMATED AVERAGE GLUCOSE: 120 mg/dL
Hgb A1c MFr Bld: 5.8 % — ABNORMAL HIGH (ref 4.8–5.6)

## 2018-12-07 LAB — HM DIABETES EYE EXAM

## 2018-12-10 NOTE — Progress Notes (Signed)
Subjective:     Patient ID: Kristine [name] , female    DOB: 1950/12/26 , 68 y.o.   MRN: [id number]   Chief Complaint  Patient presents with  . Hypertension  . Obesity    HPI  Hypertension  This is a chronic problem. The current episode started more than 1 year ago. The problem has been gradually improving since onset. The problem is controlled. Pertinent negatives include no blurred vision, chest pain, palpitations or shortness of breath. Risk factors for coronary artery disease include obesity, post-menopausal state and sedentary lifestyle.    Obesity She reports that she is no longer pursuing bariatric surgery. She wants "to hold off".  She repots she has made some lifestyle changes. She has cut sodas out from her diet. She adds that she is making healthier choices as well.   Past Medical History:  Diagnosis Date  . Allergy   . Arthritis   . Glaucoma    ROD  . Hypertension   . Sarcoidosis      Family History  Problem Relation Age of Onset  . Breast cancer Mother   . Lung cancer Mother   . Lung cancer Father        smoked  . Heart disease Maternal Grandmother      Current Outpatient Medications:  .  albuterol (PROAIR HFA) 108 (90 Base) MCG/ACT inhaler, Inhale 2 puffs into the lungs every 6 (six) hours as needed for wheezing or shortness of breath., Disp: 1 Inhaler, Rfl: 3 .  aspirin EC 81 MG tablet, Take 1 tablet (81 mg total) by mouth daily., Disp: , Rfl:  .  atorvastatin (LIPITOR) 10 MG tablet, Take 10 mg by mouth daily., Disp: , Rfl:  .  Brinzolamide-Brimonidine (SIMBRINZA) 1-0.2 % SUSP, Apply 1 drop to eye 2 (two) times daily., Disp: , Rfl:  .  CALCIUM CARBONATE PO, Take by mouth., Disp: , Rfl:  .  Cholecalciferol (VITAMIN D3) 5000 UNITS CAPS, Take 5,000 Units by mouth daily., Disp: , Rfl:  .  famotidine (PEPCID) 20 MG tablet, TAKE ONE TABLET BY MOUTH AT BEDTIME, Disp: 30 tablet, Rfl: 5 .  metoprolol succinate (TOPROL-XL) 25 MG 24 hr tablet, TAKE ONE TABLET BY  MOUTH DAILY, Disp: 60 tablet, Rfl: 3 .  telmisartan (MICARDIS) 80 MG tablet, , Disp: , Rfl:  .  pantoprazole (PROTONIX) 40 MG tablet, TAKE ONE TABLET BY MOUTH DAILY 30-60 MINUTES BEFORE FIRST MEAL OF THE DAY **NEEDS OFFICE VISIT FOR MORE REFILLS** (Patient not taking: Reported on 12/05/2018), Disp: 30 tablet, Rfl: 0   No Known Allergies   Review of Systems  Constitutional: Negative.   Eyes: Negative for blurred vision.  Respiratory: Negative.  Negative for shortness of breath.   Cardiovascular: Negative.  Negative for chest pain and palpitations.  Gastrointestinal: Negative.   Neurological: Negative.   Psychiatric/Behavioral: Negative.      Today's Vitals   12/05/18 1120  BP: 130/86  Pulse: 77  Temp: (!) 97.4 F (36.3 C)  TempSrc: Oral  Weight: 253 lb 6.4 oz (114.9 kg)  Height: _0  (1.702 m)  PainSc: 0-No pain   Body mass index is 39.69 kg/m.   Objective:  Physical Exam Vitals signs and nursing note reviewed.  Constitutional:      Appearance: Normal appearance. She is obese.  HENT:     Head: Normocephalic and atraumatic.  Cardiovascular:     Rate and Rhythm: Normal rate and regular rhythm.     Heart sounds: Normal heart sounds.  Pulmonary:     Effort: Pulmonary effort is normal.     Breath sounds: Normal breath sounds.  Skin:    General: Skin is warm.  Neurological:     General: No focal deficit present.     Mental Status: She is alert.         Assessment And Plan:     1. Essential hypertension  Controlled. She will continue with current meds. She is encouraged to avoid adding salt to her foods.   - BMP8+EGFR - POCT Urinalysis Dipstick (81002) - POCT UA - Microalbumin  2. Other abnormal glucose  HER A1C HAS BEEN ELEVATED IN THE PAST. I WILL CHECK AN A1C, BMET TODAY. SHE WAS ENCOURAGED TO AVOID SUGARY BEVERAGES AND PROCESSED FOODS INCLUDNG BREADS, RICE AND PASTA.  - Hemoglobin A1c  3. Class 2 severe obesity due to excess calories with serious  comorbidity and body mass index (BMI) of 39.0 to 39.9 in adult [facility])  She is encouraged to strive for BMI less than 30 to decrease cardiac risk. She is no longer interested in bariatric surgery at this time. She does agree to referral to [facility].   - Amb Ref to Medical Weight Management       [name], MD

## 2018-12-12 ENCOUNTER — Ambulatory Visit: Payer: BC Managed Care – PPO | Admitting: Adult Health

## 2019-01-22 ENCOUNTER — Other Ambulatory Visit: Payer: Self-pay | Admitting: Cardiology

## 2019-01-29 ENCOUNTER — Encounter: Payer: Self-pay | Admitting: Internal Medicine

## 2019-02-05 ENCOUNTER — Ambulatory Visit: Payer: BC Managed Care – PPO | Admitting: Internal Medicine

## 2019-04-20 ENCOUNTER — Other Ambulatory Visit: Payer: Self-pay | Admitting: Nurse Practitioner

## 2019-04-20 ENCOUNTER — Other Ambulatory Visit: Payer: Self-pay | Admitting: Cardiology

## 2019-04-20 NOTE — Telephone Encounter (Signed)
Please fill

## 2019-05-16 ENCOUNTER — Other Ambulatory Visit: Payer: Self-pay | Admitting: Internal Medicine

## 2019-05-16 ENCOUNTER — Ambulatory Visit: Payer: BC Managed Care – PPO | Admitting: Internal Medicine

## 2019-05-16 ENCOUNTER — Encounter: Payer: Self-pay | Admitting: Internal Medicine

## 2019-05-16 ENCOUNTER — Other Ambulatory Visit: Payer: Self-pay

## 2019-05-16 VITALS — BP 114/76 | HR 81 | Temp 98.5°F | Ht 67.0 in | Wt 230.8 lb

## 2019-05-16 DIAGNOSIS — Y92512 Supermarket, store or market as the place of occurrence of the external cause: Secondary | ICD-10-CM

## 2019-05-16 DIAGNOSIS — Z6836 Body mass index (BMI) 36.0-36.9, adult: Secondary | ICD-10-CM

## 2019-05-16 DIAGNOSIS — W19XXXA Unspecified fall, initial encounter: Secondary | ICD-10-CM

## 2019-05-16 DIAGNOSIS — M545 Low back pain, unspecified: Secondary | ICD-10-CM

## 2019-05-16 DIAGNOSIS — R002 Palpitations: Secondary | ICD-10-CM

## 2019-05-16 DIAGNOSIS — R232 Flushing: Secondary | ICD-10-CM

## 2019-05-16 DIAGNOSIS — G8929 Other chronic pain: Secondary | ICD-10-CM | POA: Diagnosis not present

## 2019-05-16 DIAGNOSIS — I10 Essential (primary) hypertension: Secondary | ICD-10-CM

## 2019-05-16 LAB — POCT URINALYSIS DIPSTICK
Bilirubin, UA: NEGATIVE
Blood, UA: NEGATIVE
Glucose, UA: NEGATIVE
Ketones, UA: NEGATIVE
Leukocytes, UA: NEGATIVE
Nitrite, UA: NEGATIVE
Protein, UA: NEGATIVE
Spec Grav, UA: 1.025 (ref 1.010–1.025)
Urobilinogen, UA: 0.2 E.U./dL
pH, UA: 5.5 (ref 5.0–8.0)

## 2019-05-16 NOTE — Patient Instructions (Signed)
Chronic Back Pain When back pain lasts longer than 3 months, it is called chronic back pain. Pain may get worse at certain times (flare-ups). There are things you can do at home to manage your pain. Follow these instructions at home: Activity      Avoid bending and other activities that make pain worse.  When standing: ? Keep your upper back and neck straight. ? Keep your shoulders pulled back. ? Avoid slouching.  When sitting: ? Keep your back straight. ? Relax your shoulders. Do not round your shoulders or pull them backward.  Do not sit or stand in one place for long periods of time.  Take short rest breaks during the day. Lying down or standing is usually better than sitting. Resting can help relieve pain.  When sitting or lying down for a long time, do some mild activity or stretching. This will help to prevent stiffness and pain.  Get regular exercise. Ask your doctor what activities are safe for you.  Do not lift anything that is heavier than 10 lb (4.5 kg). To prevent injury when you lift things: ? Bend your knees. ? Keep the weight close to your body. ? Avoid twisting. Managing pain  If told, put ice on the painful area. Your doctor may tell you to use ice for 24-48 hours after a flare-up starts. ? Put ice in a plastic bag. ? Place a towel between your skin and the bag. ? Leave the ice on for 20 minutes, 2-3 times a day.  If told, put heat on the painful area as often as told by your doctor. Use the heat source that your doctor recommends, such as a moist heat pack or a heating pad. ? Place a towel between your skin and the heat source. ? Leave the heat on for 20-30 minutes. ? Remove the heat if your skin turns bright red. This is especially important if you are unable to feel pain, heat, or cold. You may have a greater risk of getting burned.  Soak in a warm bath. This can help relieve pain.  Take over-the-counter and prescription medicines only as told by your  doctor. General instructions  Sleep on a firm mattress. Try lying on your side with your knees slightly bent. If you lie on your back, put a pillow under your knees.  Keep all follow-up visits as told by your doctor. This is important. Contact a doctor if:  You have pain that does not get better with rest or medicine. Get help right away if:  One or both of your arms or legs feel weak.  One or both of your arms or legs lose feeling (numbness).  You have trouble controlling when you poop (bowel movement) or pee (urinate).  You feel sick to your stomach (nauseous).  You throw up (vomit).  You have belly (abdominal) pain.  You have shortness of breath.  You pass out (faint). Summary  When back pain lasts longer than 3 months, it is called chronic back pain.  Pain may get worse at certain times (flare-ups).  Use ice and heat as told by your doctor. Your doctor may tell you to use ice after flare-ups. This information is not intended to replace advice given to you by your health care provider. Make sure you discuss any questions you have with your health care provider. Document Released: 04/19/2008 Document Revised: 02/22/2019 Document Reviewed: 06/16/2017 Elsevier Patient Education  2020 [employer].

## 2019-05-17 LAB — CBC
Hematocrit: 39.7 % (ref 34.0–46.6)
Hemoglobin: 13.1 g/dL (ref 11.1–15.9)
MCH: 26.1 pg — ABNORMAL LOW (ref 26.6–33.0)
MCHC: 33 g/dL (ref 31.5–35.7)
MCV: 79 fL (ref 79–97)
Platelets: 267 10*3/uL (ref 150–450)
RBC: 5.01 x10E6/uL (ref 3.77–5.28)
RDW: 13.7 % (ref 11.7–15.4)
WBC: 8.6 10*3/uL (ref 3.4–10.8)

## 2019-05-17 LAB — MAGNESIUM: Magnesium: 1.9 mg/dL (ref 1.6–2.3)

## 2019-05-17 LAB — TSH: TSH: 2.87 u[IU]/mL (ref 0.450–4.500)

## 2019-05-20 NOTE — Progress Notes (Signed)
Subjective:     Patient ID: Kristine [name] , female    DOB: [birth year] , 68 y.o.   MRN: [id number]   Chief Complaint  Patient presents with  . Fall    HPI  She presents today for further evaluation after a fall .  She reports she fell on 6/14 -- she slipped on yogurt that was left in the aisle in [employer]. She reports she fell onto her right  hip/back, r knee.  She felt severe pain in her hip, back and knee immediately after the fall. She went to Ortho that Fri where x-rays were performed, no fractures noted. She was given a steroid pack which has given her some relief. Unfortunately, she has some residual back pain. She does have some muscle relaxers, but she has not taken them.     Past Medical History:  Diagnosis Date  . Allergy   . Arthritis   . Glaucoma    ROD  . Hypertension   . Sarcoidosis      Family History  Problem Relation Age of Onset  . Breast cancer Mother   . Lung cancer Mother   . Lung cancer Father        smoked  . Heart disease Maternal Grandmother      Current Outpatient Medications:  .  albuterol (PROAIR HFA) 108 (90 Base) MCG/ACT inhaler, Inhale 2 puffs into the lungs every 6 (six) hours as needed for wheezing or shortness of breath., Disp: 1 Inhaler, Rfl: 3 .  aspirin EC 81 MG tablet, Take 1 tablet (81 mg total) by mouth daily., Disp: , Rfl:  .  atorvastatin (LIPITOR) 10 MG tablet, TAKE ONE TABLET BY MOUTH DAILY, Disp: 90 tablet, Rfl: 2 .  Brinzolamide-Brimonidine (SIMBRINZA) 1-0.2 % SUSP, Apply 1 drop to eye 2 (two) times daily., Disp: , Rfl:  .  CALCIUM CARBONATE PO, Take by mouth., Disp: , Rfl:  .  Cholecalciferol (VITAMIN D3) 5000 UNITS CAPS, Take 5,000 Units by mouth daily., Disp: , Rfl:  .  famotidine (PEPCID) 20 MG tablet, TAKE ONE TABLET BY MOUTH AT BEDTIME, Disp: 30 tablet, Rfl: 5 .  metoprolol succinate (TOPROL-XL) 25 MG 24 hr tablet, TAKE ONE TABLET BY MOUTH DAILY, Disp: 90 tablet, Rfl: 0 .  telmisartan (MICARDIS) 80 MG tablet, , Disp:  , Rfl:  .  cyclobenzaprine (FLEXERIL) 10 MG tablet, TAKE ONE TABLET BY MOUTH EVERY NIGHT AT BEDTIME AS NEEDED, Disp: 90 tablet, Rfl: 0   No Known Allergies   Review of Systems  Constitutional: Negative.   Respiratory: Negative.   Cardiovascular: Positive for palpitations.       Denies associated sob, cp. Usually occurs at night.   Gastrointestinal: Negative.   Genitourinary:       She has been recently having hot flashes. Not sure what is triggering her sx. Reports she went through menopause a long time ago. Not sure what this is. No  Associated fever, chills. She has had weight loss, but she has been working on this.   Neurological: Negative.   Psychiatric/Behavioral: Negative.      Today's Vitals   05/16/19 1434  BP: 114/76  Pulse: 81  Temp: 98.5 F (36.9 C)  TempSrc: Oral  Weight: 230 lb 12.8 oz (104.7 kg)  Height: _0  (1.702 m)  PainSc: 0-No pain   Body mass index is 36.15 kg/m.   Objective:  Physical Exam Vitals signs and nursing note reviewed.  Constitutional:      Appearance: Normal appearance.  HENT:     Head: Normocephalic and atraumatic.  Cardiovascular:     Rate and Rhythm: Normal rate and regular rhythm.     Heart sounds: Normal heart sounds.  Pulmonary:     Effort: Pulmonary effort is normal.     Breath sounds: Normal breath sounds.  Skin:    General: Skin is warm.  Neurological:     General: No focal deficit present.     Mental Status: She is alert.  Psychiatric:        Mood and Affect: Mood normal.        Behavior: Behavior normal.         Assessment And Plan:     1. Fall, initial encounter  This occurred on 6/14. I will request her notes from Ortho.   2. Hot flashes  Pt advised her sx are likely not due to menopause. I will check her thyroid function.   - TSH  3. Palpitations  I will check labs as listed below. She is encouraged to take magnesium 456m nightly. Pt reminded that magnesium is good for cardiovascular, as well as  bone health.  She will let me know if her sx persist.  - CBC no Diff - Magnesium  4. Essential hypertension  Well controlled. She will continue with current meds. She is encouraged to avoid adding salt to her foods.   5. Chronic bilateral low back pain without sciatica  Chronic. She is advised to use muscle relaxers as needed. She is also encouraged to perform stretching exercises regularly. She will f/u with Ortho should her sx persist.   - POCT Urinalysis Dipstick (81002)  6. Class 2 severe obesity due to excess calories with serious comorbidity and body mass index (BMI) of 36.0 to 36.9 in adult ([facility]  She was congratulated on her 23 pound weight loss thus far. She is encouraged to keep up the great work. She is encouraged to strive for BMI less than 30 to decrease cardiac risk.   RMaximino Greenland MD    THE PATIENT IS ENCOURAGED TO PRACTICE SOCIAL DISTANCING DUE TO THE COVID-19 PANDEMIC.

## 2019-05-22 LAB — COMPREHENSIVE METABOLIC PANEL
ALT: 13 IU/L (ref 0–32)
AST: 15 IU/L (ref 0–40)
Albumin/Globulin Ratio: 1.6 (ref 1.2–2.2)
Albumin: 4.6 g/dL (ref 3.8–4.8)
Alkaline Phosphatase: 59 IU/L (ref 39–117)
BUN/Creatinine Ratio: 21 (ref 12–28)
BUN: 24 mg/dL (ref 8–27)
Bilirubin Total: 0.3 mg/dL (ref 0.0–1.2)
CO2: 17 mmol/L — ABNORMAL LOW (ref 20–29)
Calcium: 10.4 mg/dL — ABNORMAL HIGH (ref 8.7–10.3)
Chloride: 102 mmol/L (ref 96–106)
Creatinine, Ser: 1.17 mg/dL — ABNORMAL HIGH (ref 0.57–1.00)
GFR calc Af Amer: 56 mL/min/{1.73_m2} — ABNORMAL LOW (ref >59)
GFR calc non Af Amer: 49 mL/min/{1.73_m2} — ABNORMAL LOW (ref >59)
Globulin, Total: 2.8 g/dL (ref 1.5–4.5)
Glucose: 98 mg/dL (ref 65–99)
Potassium: 4.2 mmol/L (ref 3.5–5.2)
Sodium: 139 mmol/L (ref 134–144)
Total Protein: 7.4 g/dL (ref 6.0–8.5)

## 2019-05-22 LAB — SPECIMEN STATUS REPORT

## 2019-05-25 ENCOUNTER — Other Ambulatory Visit: Payer: Self-pay

## 2019-07-09 ENCOUNTER — Ambulatory Visit: Payer: BC Managed Care – PPO | Admitting: Internal Medicine

## 2019-07-09 ENCOUNTER — Other Ambulatory Visit: Payer: Self-pay

## 2019-07-09 ENCOUNTER — Encounter: Payer: Self-pay | Admitting: Internal Medicine

## 2019-07-09 VITALS — BP 128/74 | HR 69 | Temp 97.5°F | Ht 67.0 in | Wt 235.2 lb

## 2019-07-09 DIAGNOSIS — Z0000 Encounter for general adult medical examination without abnormal findings: Secondary | ICD-10-CM

## 2019-07-09 DIAGNOSIS — M722 Plantar fascial fibromatosis: Secondary | ICD-10-CM

## 2019-07-09 DIAGNOSIS — I10 Essential (primary) hypertension: Secondary | ICD-10-CM | POA: Diagnosis not present

## 2019-07-09 DIAGNOSIS — E2839 Other primary ovarian failure: Secondary | ICD-10-CM | POA: Diagnosis not present

## 2019-07-09 DIAGNOSIS — J410 Simple chronic bronchitis: Secondary | ICD-10-CM

## 2019-07-09 DIAGNOSIS — I209 Angina pectoris, unspecified: Secondary | ICD-10-CM

## 2019-07-09 LAB — POCT URINALYSIS DIPSTICK
Bilirubin, UA: NEGATIVE
Blood, UA: NEGATIVE
Glucose, UA: NEGATIVE
Ketones, UA: NEGATIVE
Leukocytes, UA: NEGATIVE
Nitrite, UA: NEGATIVE
Protein, UA: NEGATIVE
Spec Grav, UA: >=1.03 — AB
Urobilinogen, UA: 0.2 U/dL
pH, UA: 6

## 2019-07-09 LAB — POCT UA - MICROALBUMIN
Albumin/Creatinine Ratio, Urine, POC: <30
Creatinine, POC: 100 mg/dL
Microalbumin Ur, POC: 10 mg/L

## 2019-07-09 MED ORDER — ALBUTEROL SULFATE HFA 108 (90 BASE) MCG/ACT IN AERS: 2.0000 | INHALATION_SPRAY | Freq: Four times a day (QID) | RESPIRATORY_TRACT | 1 refills | Status: DC | PRN

## 2019-07-09 NOTE — Patient Instructions (Addendum)
Health Maintenance, Female Adopting a healthy lifestyle and getting preventive care are important in promoting health and wellness. Ask your health care provider about:  The right schedule for you to have regular tests and exams.  Things you can do on your own to prevent diseases and keep yourself healthy. What should I know about diet, weight, and exercise? Eat a healthy diet   Eat a diet that includes plenty of vegetables, fruits, low-fat dairy products, and lean protein.  Do not eat a lot of foods that are high in solid fats, added sugars, or sodium. Maintain a healthy weight Body mass index (BMI) is used to identify weight problems. It estimates body fat based on height and weight. Your health care provider can help determine your BMI and help you achieve or maintain a healthy weight. Get regular exercise Get regular exercise. This is one of the most important things you can do for your health. Most adults should:  Exercise for at least 150 minutes each week. The exercise should increase your heart rate and make you sweat (moderate-intensity exercise).  Do strengthening exercises at least twice a week. This is in addition to the moderate-intensity exercise.  Spend less time sitting. Even light physical activity can be beneficial. Watch cholesterol and blood lipids Have your blood tested for lipids and cholesterol at 67 years of age, then have this test every 5 years. Have your cholesterol levels checked more often if:  Your lipid or cholesterol levels are high.  You are older than 67 years of age.  You are at high risk for heart disease. What should I know about cancer screening? Depending on your health history and family history, you may need to have cancer screening at various ages. This may include screening for:  Breast cancer.  Cervical cancer.  Colorectal cancer.  Skin cancer.  Lung cancer. What should I know about heart disease, diabetes, and high blood  pressure? Blood pressure and heart disease  High blood pressure causes heart disease and increases the risk of stroke. This is more likely to develop in people who have high blood pressure readings, are of African descent, or are overweight.  Have your blood pressure checked: ? Every 3-5 years if you are 68-56 years of age. ? Every year if you are 44 years old or older. Diabetes Have regular diabetes screenings. This checks your fasting blood sugar level. Have the screening done:  Once every three years after age 35 if you are at a normal weight and have a low risk for diabetes.  More often and at a younger age if you are overweight or have a high risk for diabetes. What should I know about preventing infection? Hepatitis B If you have a higher risk for hepatitis B, you should be screened for this virus. Talk with your health care provider to find out if you are at risk for hepatitis B infection. Hepatitis C Testing is recommended for:  Everyone born from [birth year] through 1964.  Anyone with known risk factors for hepatitis C. Sexually transmitted infections (STIs)  Get screened for STIs, including gonorrhea and chlamydia, if: ? You are sexually active and are younger than 67 years of age. ? You are older than 67 years of age and your health care provider tells you that you are at risk for this type of infection. ? Your sexual activity has changed since you were last screened, and you are at increased risk for chlamydia or gonorrhea. Ask your health care provider if  you are at risk.  Ask your health care provider about whether you are at high risk for HIV. Your health care provider may recommend a prescription medicine to help prevent HIV infection. If you choose to take medicine to prevent HIV, you should first get tested for HIV. You should then be tested every 3 months for as long as you are taking the medicine. Pregnancy  If you are about to stop having your period (premenopausal) and  you may become pregnant, seek counseling before you get pregnant.  Take 400 to 800 micrograms (mcg) of folic acid every day if you become pregnant.  Ask for birth control (contraception) if you want to prevent pregnancy. Osteoporosis and menopause Osteoporosis is a disease in which the bones lose minerals and strength with aging. This can result in bone fractures. If you are 46 years old or older, or if you are at risk for osteoporosis and fractures, ask your health care provider if you should:  Be screened for bone loss.  Take a calcium or vitamin D supplement to lower your risk of fractures.  Be given hormone replacement therapy (HRT) to treat symptoms of menopause. Follow these instructions at home: Lifestyle  Do not use any products that contain nicotine or tobacco, such as cigarettes, e-cigarettes, and chewing tobacco. If you need help quitting, ask your health care provider.  Do not use street drugs.  Do not share needles.  Ask your health care provider for help if you need support or information about quitting drugs. Alcohol use  Do not drink alcohol if: ? Your health care provider tells you not to drink. ? You are pregnant, may be pregnant, or are planning to become pregnant.  If you drink alcohol: ? Limit how much you use to 0-1 drink a day. ? Limit intake if you are breastfeeding.  Be aware of how much alcohol is in your drink. In the [place] one drink equals one 12 oz bottle of beer (355 mL), one 5 oz glass of wine (148 mL), or one 1 oz glass of hard liquor (44 mL). General instructions  Schedule regular health, dental, and eye exams.  Stay current with your vaccines.  Tell your health care provider if: ? You often feel depressed. ? You have ever been abused or do not feel safe at home. Summary  Adopting a healthy lifestyle and getting preventive care are important in promoting health and wellness.  Follow your health care provider's instructions about healthy  diet, exercising, and getting tested or screened for diseases.  Follow your health care provider's instructions on monitoring your cholesterol and blood pressure. This information is not intended to replace advice given to you by your health care provider. Make sure you discuss any questions you have with your health care provider. Document Released: 05/17/2011 Document Revised: 10/25/2018 Document Reviewed: 10/25/2018 Elsevier Patient Education  [place].    Plantar Fasciitis  Plantar fasciitis is a painful foot condition that affects the heel. It occurs when the band of tissue that connects the toes to the heel bone (plantar fascia) becomes irritated. This can happen as the result of exercising too much or doing other repetitive activities (overuse injury). The pain from plantar fasciitis can range from mild irritation to severe pain that makes it difficult to walk or move. The pain is usually worse in the morning after sleeping, or after sitting or lying down for a while. Pain may also be worse after long periods of walking or standing. What  are the causes? This condition may be caused by:  Standing for long periods of time.  Wearing shoes that do not have good arch support.  Doing activities that put stress on joints (high-impact activities), including running, aerobics, and ballet.  Being overweight.  An abnormal way of walking (gait).  Tight muscles in the back of your lower leg (calf).  High arches in your feet.  Starting a new athletic activity. What are the signs or symptoms? The main symptom of this condition is heel pain. Pain may:  Be worse with first steps after a time of rest, especially in the morning after sleeping or after you have been sitting or lying down for a while.  Be worse after long periods of standing still.  Decrease after 30-45 minutes of activity, such as gentle walking. How is this diagnosed? This condition may be diagnosed based on  your medical history and your symptoms. Your health care provider may ask questions about your activity level. Your health care provider will do a physical exam to check for:  A tender area on the bottom of your foot.  A high arch in your foot.  Pain when you move your foot.  Difficulty moving your foot. You may have imaging tests to confirm the diagnosis, such as:  X-rays.  Ultrasound.  MRI. How is this treated? Treatment for plantar fasciitis depends on how severe your condition is. Treatment may include:  Rest, ice, applying pressure (compression), and raising the affected foot (elevation). This may be called RICE therapy. Your health care provider may recommend RICE therapy along with over-the-counter pain medicines to manage your pain.  Exercises to stretch your calves and your plantar fascia.  A splint that holds your foot in a stretched, upward position while you sleep (night splint).  Physical therapy to relieve symptoms and prevent problems in the future.  Injections of steroid medicine (cortisone) to relieve pain and inflammation.  Stimulating your plantar fascia with electrical impulses (extracorporeal shock wave therapy). This is usually the last treatment option before surgery.  Surgery, if other treatments have not worked after 12 months. Follow these instructions at home:  Managing pain, stiffness, and swelling  If directed, put ice on the painful area: ? Put ice in a plastic bag, or use a frozen bottle of water. ? Place a towel between your skin and the bag or bottle. ? Roll the bottom of your foot over the bag or bottle. ? Do this for 20 minutes, 2-3 times a day.  Wear athletic shoes that have air-sole or gel-sole cushions, or try wearing soft shoe inserts that are designed for plantar fasciitis.  Raise (elevate) your foot above the level of your heart while you are sitting or lying down. Activity  Avoid activities that cause pain. Ask your health care  provider what activities are safe for you.  Do physical therapy exercises and stretches as told by your health care provider.  Try activities and forms of exercise that are easier on your joints (low-impact). Examples include swimming, water aerobics, and biking. General instructions  Take over-the-counter and prescription medicines only as told by your health care provider.  Wear a night splint while sleeping, if told by your health care provider. Loosen the splint if your toes tingle, become numb, or turn cold and blue.  Maintain a healthy weight, or work with your health care provider to lose weight as needed.  Keep all follow-up visits as told by your health care provider. This is important.  Contact a health care provider if you:  Have symptoms that do not go away after caring for yourself at home.  Have pain that gets worse.  Have pain that affects your ability to move or do your daily activities. Summary  Plantar fasciitis is a painful foot condition that affects the heel. It occurs when the band of tissue that connects the toes to the heel bone (plantar fascia) becomes irritated.  The main symptom of this condition is heel pain that may be worse after exercising too much or standing still for a long time.  Treatment varies, but it usually starts with rest, ice, compression, and elevation (RICE therapy) and over-the-counter medicines to manage pain. This information is not intended to replace advice given to you by your health care provider. Make sure you discuss any questions you have with your health care provider. Document Released: 07/27/2001 Document Revised: 10/14/2017 Document Reviewed: 08/29/2017 Elsevier Patient Education  2020 [employer].

## 2019-07-10 DIAGNOSIS — I209 Angina pectoris, unspecified: Secondary | ICD-10-CM | POA: Insufficient documentation

## 2019-07-10 LAB — BMP8+EGFR
BUN/Creatinine Ratio: 25 (ref 12–28)
BUN: 22 mg/dL (ref 8–27)
CO2: 23 mmol/L (ref 20–29)
Calcium: 9.6 mg/dL (ref 8.7–10.3)
Chloride: 106 mmol/L (ref 96–106)
Creatinine, Ser: 0.88 mg/dL (ref 0.57–1.00)
GFR calc Af Amer: 79 mL/min/{1.73_m2} (ref >59)
GFR calc non Af Amer: 69 mL/min/{1.73_m2} (ref >59)
Glucose: 92 mg/dL (ref 65–99)
Potassium: 4.3 mmol/L (ref 3.5–5.2)
Sodium: 143 mmol/L (ref 134–144)

## 2019-07-10 LAB — HEMOGLOBIN A1C
Est. average glucose Bld gHb Est-mCnc: 117 mg/dL
Hgb A1c MFr Bld: 5.7 % — ABNORMAL HIGH (ref 4.8–5.6)

## 2019-07-10 LAB — LIPID PANEL
Chol/HDL Ratio: 2.4 ratio (ref 0.0–4.4)
Cholesterol, Total: 126 mg/dL (ref 100–199)
HDL: 52 mg/dL (ref >39)
LDL Calculated: 59 mg/dL (ref 0–99)
Triglycerides: 74 mg/dL (ref 0–149)
VLDL Cholesterol Cal: 15 mg/dL (ref 5–40)

## 2019-07-10 NOTE — Progress Notes (Signed)
Subjective:     Patient ID: Kristine [name] , female    DOB: 1951/04/06 , 68 y.o.   MRN: [id number]   Chief Complaint  Patient presents with  . Annual Exam  . Hypertension    HPI  She is here today for a full physical examination. She is followed by GYN for her pelvic exams.   Hypertension This is a chronic problem. The current episode started more than 1 year ago. The problem has been gradually improving since onset. The problem is controlled. Pertinent negatives include no blurred vision, chest pain, palpitations or shortness of breath. Risk factors for coronary artery disease include sedentary lifestyle, obesity and post-menopausal state. The current treatment provides moderate improvement.     Past Medical History:  Diagnosis Date  . Allergy   . Arthritis   . Glaucoma    ROD  . Hypertension   . Sarcoidosis      Family History  Problem Relation Age of Onset  . Breast cancer Mother   . Lung cancer Mother   . Lung cancer Father        smoked  . Heart disease Maternal Grandmother      Current Outpatient Medications:  .  aspirin EC 81 MG tablet, Take 1 tablet (81 mg total) by mouth daily., Disp: , Rfl:  .  atorvastatin (LIPITOR) 10 MG tablet, TAKE ONE TABLET BY MOUTH DAILY, Disp: 90 tablet, Rfl: 2 .  Brinzolamide-Brimonidine (SIMBRINZA) 1-0.2 % SUSP, Apply 1 drop to eye 2 (two) times daily., Disp: , Rfl:  .  Cholecalciferol (VITAMIN D3) 5000 UNITS CAPS, Take 5,000 Units by mouth daily., Disp: , Rfl:  .  cyclobenzaprine (FLEXERIL) 10 MG tablet, TAKE ONE TABLET BY MOUTH EVERY NIGHT AT BEDTIME AS NEEDED, Disp: 90 tablet, Rfl: 0 .  Magnesium 400 MG CAPS, Take by mouth., Disp: , Rfl:  .  metoprolol succinate (TOPROL-XL) 25 MG 24 hr tablet, TAKE ONE TABLET BY MOUTH DAILY, Disp: 90 tablet, Rfl: 0 .  telmisartan (MICARDIS) 80 MG tablet, , Disp: , Rfl:  .  albuterol (PROAIR HFA) 108 (90 Base) MCG/ACT inhaler, Inhale 2 puffs into the lungs every 6 (six) hours as needed for  wheezing or shortness of breath., Disp: 18 g, Rfl: 1 .  famotidine (PEPCID) 20 MG tablet, TAKE ONE TABLET BY MOUTH AT BEDTIME (Patient not taking: Reported on 07/09/2019), Disp: 30 tablet, Rfl: 5   No Known Allergies    The patient states she uses post menopausal status for birth control. Last LMP was No LMP recorded. Patient has had a hysterectomy.. Negative for Dysmenorrhea Negative for: breast discharge, breast lump(s), breast pain and breast self exam. Associated symptoms include abnormal vaginal bleeding. Pertinent negatives include abnormal bleeding (hematology), anxiety, decreased libido, depression, difficulty falling sleep, dyspareunia, history of infertility, nocturia, sexual dysfunction, sleep disturbances, urinary incontinence, urinary urgency, vaginal discharge and vaginal itching. Diet regular.The patient states her exercise level is    . The patient's tobacco use is:  Social History   Tobacco Use  Smoking Status Never Smoker  Smokeless Tobacco Never Used  . She has been exposed to passive smoke. The patient's alcohol use is:  Social History   Substance and Sexual Activity  Alcohol Use Yes   Comment: occ    Review of Systems  Constitutional: Negative.   HENT: Negative.   Eyes: Negative.  Negative for blurred vision.  Respiratory: Negative.  Negative for shortness of breath.   Cardiovascular: Negative.  Negative for chest pain and  palpitations.  Gastrointestinal: Negative.   Endocrine: Negative.   Genitourinary: Negative.   Musculoskeletal: Positive for arthralgias.       She c/o b/l foot pain. There is pain with ambulation. Sx worsened in am when getting out of bed. Denies trauma/fall.   Skin: Negative.   Allergic/Immunologic: Negative.   Neurological: Negative.   Hematological: Negative.   Psychiatric/Behavioral: Negative.      Today's Vitals   07/09/19 1107  BP: 128/74  Pulse: 69  Temp: (!) 97.5 F (36.4 C)  TempSrc: Oral  Weight: 235 lb 3.2 oz (106.7 kg)   Height: _0  (1.702 m)  PainSc: 0-No pain   Body mass index is 36.84 kg/m.   Objective:  Physical Exam Vitals signs and nursing note reviewed.  Constitutional:      Appearance: Normal appearance. She is obese.  HENT:     Head: Normocephalic and atraumatic.     Right Ear: Tympanic membrane, ear canal and external ear normal.     Left Ear: Tympanic membrane, ear canal and external ear normal.     Nose: Nose normal.     Mouth/Throat:     Mouth: Mucous membranes are moist.     Pharynx: Oropharynx is clear.  Eyes:     Extraocular Movements: Extraocular movements intact.     Conjunctiva/sclera: Conjunctivae normal.     Pupils: Pupils are equal, round, and reactive to light.  Neck:     Musculoskeletal: Normal range of motion and neck supple.  Cardiovascular:     Rate and Rhythm: Normal rate and regular rhythm.     Pulses: Normal pulses.     Heart sounds: Normal heart sounds.  Pulmonary:     Effort: Pulmonary effort is normal.     Breath sounds: Normal breath sounds.  Chest:     Breasts: Tanner Score is 5.        Right: Normal. No swelling, bleeding, inverted nipple, mass, nipple discharge or skin change.        Left: Normal. No swelling, bleeding, inverted nipple, mass, nipple discharge or skin change.  Abdominal:     General: Bowel sounds are normal.     Palpations: Abdomen is soft.     Comments: Large pannus, obese  Genitourinary:    Comments: deferred Musculoskeletal: Normal range of motion.  Skin:    General: Skin is warm and dry.  Neurological:     General: No focal deficit present.     Mental Status: She is alert and oriented to person, place, and time.  Psychiatric:        Mood and Affect: Mood normal.        Behavior: Behavior normal.         Assessment And Plan:     1. Routine general medical examination at health care facility  A full exam was performed. Importance of monthly self breast exams was discussed with the patient. PATIENT HAS BEEN ADVISED TO  GET 30-45 MINUTES REGULAR EXERCISE NO LESS THAN FOUR TO FIVE DAYS PER WEEK - BOTH WEIGHTBEARING EXERCISES AND AEROBIC ARE RECOMMENDED.  SHE WAS ADVISED TO FOLLOW A HEALTHY DIET WITH AT LEAST SIX FRUITS/VEGGIES PER DAY, DECREASE INTAKE OF RED MEAT, AND TO INCREASE FISH INTAKE TO TWO DAYS PER WEEK.  MEATS/FISH SHOULD NOT BE FRIED, BAKED OR BROILED IS PREFERABLE.  I SUGGEST WEARING SPF 50 SUNSCREEN ON EXPOSED PARTS AND ESPECIALLY WHEN IN THE DIRECT SUNLIGHT FOR AN EXTENDED PERIOD OF TIME.  PLEASE AVOID FAST FOOD RESTAURANTS AND INCREASE  YOUR WATER INTAKE.  - BMP8+EGFR - Hemoglobin A1c - Lipid Profile  2. Essential hypertension  Chronic well controlled. She will continue with current meds. She is encouraged to avoid adding salt to her foods. She is encouraged to exercise no less than 150 minutes per week. EKG performed, no new changes noted.   - POCT Urinalysis Dipstick (81002) - POCT UA - Microalbumin - EKG 12-Lead  3. Plantar fasciitis, bilateral  She was given some exercises to perform upon awakening and several times during the day. She plans to f/u with her podiatrist. Pt advised that she may benefit from orthotics.   4. Estrogen deficiency  Importance of vitamin D and calcium supplementation was discussed with the patient. Additionally, she is encouraged to engage in weight-bearing exercises at least three days weekly. [facility].   - DG BONE DENSITY (DXA)        [name], MD    THE PATIENT IS ENCOURAGED TO PRACTICE SOCIAL DISTANCING DUE TO THE COVID-19 PANDEMIC.

## 2019-07-11 ENCOUNTER — Telehealth: Payer: Self-pay

## 2019-07-11 NOTE — Telephone Encounter (Signed)
-----  Message from Glendale Chard, MD sent at 07/10/2019  7:14 PM EDT ----- Your kidney fxn has improved. This is great! Please stay well hydrated. Your hba1c has improved, down to 5.7. Normal is less than 5.6.  Your chol is great! Keep up the great work !

## 2019-07-11 NOTE — Telephone Encounter (Signed)
Left a detailed message at the pt's request.

## 2019-07-19 ENCOUNTER — Other Ambulatory Visit: Payer: Self-pay | Admitting: Internal Medicine

## 2019-07-30 ENCOUNTER — Other Ambulatory Visit: Payer: Self-pay

## 2019-07-30 ENCOUNTER — Ambulatory Visit (INDEPENDENT_AMBULATORY_CARE_PROVIDER_SITE_OTHER): Payer: BC Managed Care – PPO

## 2019-07-30 VITALS — BP 122/78 | HR 76 | Temp 98.0°F | Ht 67.0 in | Wt 238.6 lb

## 2019-07-30 DIAGNOSIS — Z23 Encounter for immunization: Secondary | ICD-10-CM

## 2019-07-30 NOTE — Progress Notes (Signed)
Pt presented today for flu shot

## 2019-08-29 ENCOUNTER — Ambulatory Visit
Admission: RE | Admit: 2019-08-29 | Discharge: 2019-08-29 | Disposition: A | Discharge status: Home / Self Care | Payer: BC Managed Care – PPO | Source: Ambulatory Visit | Attending: Internal Medicine | Admitting: Internal Medicine

## 2019-08-29 ENCOUNTER — Other Ambulatory Visit: Payer: Self-pay

## 2019-09-03 ENCOUNTER — Telehealth: Payer: Self-pay

## 2019-09-03 NOTE — Telephone Encounter (Signed)
Pt will call back with the dose of vit d that she is taking

## 2019-09-03 NOTE — Telephone Encounter (Signed)
-----  Message from Glendale Chard, MD sent at 09/03/2019  3:32 PM EDT ----- Have her focus on getting vitamin D levels back up. What dose is she taking? If not taking any, start 1000 units once daily. Be sure it is in gel form

## 2019-10-17 ENCOUNTER — Other Ambulatory Visit: Payer: Self-pay | Admitting: Internal Medicine

## 2019-12-12 ENCOUNTER — Encounter: Payer: Self-pay | Admitting: Internal Medicine

## 2019-12-24 ENCOUNTER — Ambulatory Visit: Payer: BC Managed Care – PPO

## 2019-12-25 ENCOUNTER — Ambulatory Visit: Payer: Self-pay | Admitting: Cardiology

## 2019-12-26 ENCOUNTER — Ambulatory Visit: Payer: BC Managed Care – PPO | Admitting: Cardiology

## 2019-12-26 ENCOUNTER — Other Ambulatory Visit: Payer: Self-pay

## 2019-12-26 ENCOUNTER — Encounter: Payer: Self-pay | Admitting: Cardiology

## 2019-12-26 VITALS — BP 127/79 | HR 77 | Temp 95.5°F | Ht 66.0 in | Wt 235.6 lb

## 2019-12-26 DIAGNOSIS — E7800 Pure hypercholesterolemia, unspecified: Secondary | ICD-10-CM | POA: Diagnosis not present

## 2019-12-26 DIAGNOSIS — R0609 Other forms of dyspnea: Secondary | ICD-10-CM | POA: Diagnosis not present

## 2019-12-26 DIAGNOSIS — D862 Sarcoidosis of lung with sarcoidosis of lymph nodes: Secondary | ICD-10-CM

## 2019-12-26 DIAGNOSIS — R0600 Dyspnea, unspecified: Secondary | ICD-10-CM

## 2019-12-26 DIAGNOSIS — I25118 Atherosclerotic heart disease of native coronary artery with other forms of angina pectoris: Secondary | ICD-10-CM

## 2019-12-26 DIAGNOSIS — I2723 Pulmonary hypertension due to lung diseases and hypoxia: Secondary | ICD-10-CM | POA: Diagnosis not present

## 2019-12-26 NOTE — Progress Notes (Signed)
Primary Physician/Referring:  Glendale Chard, MD  Patient ID: Kristine [name], female    DOB: 1951-01-01, 69 y.o.   MRN: [id number]  Chief Complaint  Patient presents with  . Shortness of Breath  . Hyperlipidemia   HPI:    Kristine [name]  is a 69 y.o. with hypertension, sarcoidosis of lung being followed by Dr. Christinia Gully, morbid obesity, severe obstructive sleep apnea on CPAP since August 2018 and follows Dr. Roddie Mc, hypertension, mild hyperlipidemia,  prediabetes, positive RNP antibody and also elevated ANA, had non-ischemic stress test in May 2019 and echocardiogram also performed at that time normal LVEF, moderate pulmonary HTN. Last seen in June 2019, now presents for routine follow up.   She has noticed worsening symptoms of dyspnea over the past several months.  No PND or orthopnea.  No leg edema.  She has not had any chest pain.  Past Medical History:  Diagnosis Date  . Allergy   . Arthritis   . Glaucoma    ROD  . Hypertension   . Sarcoidosis    Past Surgical History:  Procedure Laterality Date  . ABDOMINAL HYSTERECTOMY    . BUNIONECTOMY     left  . CHOLECYSTECTOMY    . KNEE SURGERY     right  . LEFT HEART CATHETERIZATION WITH CORONARY ANGIOGRAM N/A 09/12/2014   Procedure: LEFT HEART CATHETERIZATION WITH CORONARY ANGIOGRAM;  Surgeon: Troy Sine, MD;  Location: [facility] CATH LAB;  Service: Cardiovascular;  Laterality: N/A;   Social History   Tobacco Use  . Smoking status: Never Smoker  . Smokeless tobacco: Never Used  Substance Use Topics  . Alcohol use: Yes    Comment: occ    ROS  Review of Systems  Cardiovascular: Positive for dyspnea on exertion. Negative for leg swelling.  Gastrointestinal: Negative for melena.   Objective  Blood pressure 127/79, pulse 77, temperature (!) 95.5 F (35.3 C), height _0  (1.676 m), weight 235 lb 9.6 oz (106.9 kg), SpO2 96 %.  Vitals with BMI 12/26/2019 07/30/2019 07/09/2019  Height _1  _2  _3   Weight 235 lbs  10 oz 238 lbs 10 oz 235 lbs 3 oz  BMI 38.04 93.23 55.73  Systolic 220 254 270  Diastolic 79 78 74  Pulse 77 76 69     Physical Exam  Neck: No thyromegaly present.  Cardiovascular: Normal rate, regular rhythm, normal heart sounds and intact distal pulses. Exam reveals no gallop.  No murmur heard. No leg edema, no JVD.  Pulmonary/Chest: Effort normal and breath sounds normal.  Abdominal: Soft. Bowel sounds are normal.  Obese and pannus is present.  Musculoskeletal:     Cervical back: Neck supple.  Skin: Skin is warm and dry.   Laboratory examination:   Recent Labs    05/16/19 1526 07/09/19 1220  NA 139 143  K 4.2 4.3  CL 102 106  CO2 17* 23  GLUCOSE 98 92  BUN 24 22  CREATININE 1.17* 0.88  CALCIUM 10.4* 9.6  GFRNONAA 49* 69  GFRAA 56* 79   CrCl cannot be calculated (Patient's most recent lab result is older than the maximum 21 days allowed.).  CMP Latest Ref Rng & Units 07/09/2019 05/16/2019 12/05/2018  Glucose 65 - 99 mg/dL 92 98 81  BUN 8 - 27 mg/dL _4 Creatinine 0.57 - 1.00 mg/dL 0.88 1.17(H) 0.93  Sodium 134 - 144 mmol/L 143 139 142  Potassium 3.5 - 5.2 mmol/L 4.3 4.2 4.3  Chloride  96 - 106 mmol/L 106 102 102  CO2 20 - 29 mmol/L 23 17(L) 22  Calcium 8.7 - 10.3 mg/dL 9.6 10.4(H) 9.8  Total Protein 6.0 - 8.5 g/dL - 7.4 -  Total Bilirubin 0.0 - 1.2 mg/dL - 0.3 -  Alkaline Phos 39 - 117 IU/L - 59 -  AST 0 - 40 IU/L - 15 -  ALT 0 - 32 IU/L - 13 -   CBC Latest Ref Rng & Units 05/16/2019 11/06/2018 07/06/2017  WBC 3.4 - 10.8 x10E3/uL 8.6 7.7 6.2  Hemoglobin 11.1 - 15.9 g/dL 13.1 12.1 12.4  Hematocrit 34.0 - 46.6 % 39.7 36.6 38.7  Platelets 150 - 450 x10E3/uL 267 226.0 273   Lipid Panel     Component Value Date/Time   CHOL 126 07/09/2019 1220   TRIG 74 07/09/2019 1220   HDL 52 07/09/2019 1220   CHOLHDL 2.4 07/09/2019 1220   CHOLHDL 4.1 04/05/2013 0858   VLDL 15 04/05/2013 0858   LDLCALC 59 07/09/2019 1220   HEMOGLOBIN A1C Lab Results  Component Value  Date   HGBA1C 5.7 (H) 07/09/2019   MPG 117 (H) 07/11/2014   TSH Recent Labs    05/16/19 1526  TSH 2.870    External labs 03/14/2018: Cholesterol 175, triglycerides 78, HDL 47, LDL 112. Sedimentation rate 16.  12/27/2017: Hemoglobin A1c 6.2%. Creatinine 0.92, EGFR 76/66, potassium 4.0, BMP normal. Vitamin B12 308.  05/02/2017: Anti--DNA less than 1. RNP antibodies 1.1 elevated. Sjorgens negative. ANA direct positive. TSH 2.6. Vitamin D 39.  03/07/2017: Cholesterol 183, triglycerides 66, HDL 56, LDL 114.  Medications and allergies  No Known Allergies   Current Outpatient Medications  Medication Instructions  . albuterol (PROAIR HFA) 108 (90 Base) MCG/ACT inhaler 2 puffs, Inhalation, Every 6 hours PRN  . aspirin EC 81 mg, Oral, Daily  . atorvastatin (LIPITOR) 10 MG tablet TAKE ONE TABLET BY MOUTH DAILY  . Brinzolamide-Brimonidine (SIMBRINZA) 1-0.2 % SUSP 1 drop, Ophthalmic, 2 times daily  . Magnesium 400 MG CAPS Oral  . metoprolol succinate (TOPROL-XL) 25 MG 24 hr tablet TAKE ONE TABLET BY MOUTH DAILY  . telmisartan (MICARDIS) 80 mg, Oral, Daily  . Vitamin D3 5,000 Units, Oral, Daily    Radiology:   Chest x-ray PA and lateral view 12/08/2017: No acute pneumonia nor CHF. Bilateral hilar lymphadenopathy and mild chronic interstitial prominence consistent with known sarcoidosis.  Cardiac Studies:   Coronary angiogram 09/12/2014: Normal LV systolic function at 29%, 40% ostial stenosis of the LAD, otherwise normal coronary arteries.  Echocardiogram 04/04/2018: Left ventricle cavity is moderately dilated. Mild concentric hypertrophy of the left ventricle. Normal global wall motion. Diastolic function assessment limited due to degree of mitral annular calcification (MAC) Calculated EF 57%. Left atrial cavity is moderately dilated. Mild calcification of aortic valve leaflets without significant stenosis. Severe calcification of the mitral valve annulus, especially posterior annulus.  Mild restriction of mitral valve leaflets without significant stenosis. Mild to moderate tricuspid regurgitation. Estimated pulmonary artery systolic pressure 40 mmHg. Trace pulmonic regurgitation. IVC is dilated with blunted respiratory response. Suggests elevated central venous pressure.  Exercise sestamibi stress test 03/20/2018: 1. The patient performed treadmill exercise using a Bruce protocol, completing 4:49 minutes. The patient completed an estimated workload of 6.81 METS, reaching 105% of the maximum predicted heart rate. No stress symptoms reported. Peak BP 220/98 mmHg. No ischemic changes seen on stress electrocardiogram. 2. The overall quality of the study is good. There is no evidence of abnormal lung activity. Stress and rest  SPECT images demonstrate homogeneous tracer distribution throughout the myocardium. Gated SPECT imaging reveals normal myocardial thickening and wall motion. The left ventricular ejection fraction was normal (56%). 3. Low risk study.  Assessment     ICD-10-CM   1. Dyspnea on exertion  R06.00 PCV ECHOCARDIOGRAM COMPLETE    CT CORONARY MORPH W/CTA COR W/SCORE W/CA W/CM &/OR WO/CM    CT CORONARY FRACTIONAL FLOW RESERVE DATA PREP    CT CORONARY FRACTIONAL FLOW RESERVE FLUID ANALYSIS  2. Hypercholesteremia  E78.00 EKG 12-Lead  3. 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 Melvyn Novas  D86.2   4. WHO group 3 pulmonary arterial hypertension (HCC)  I27.23   5. Coronary artery disease involving native coronary artery of native heart with other form of angina pectoris (HCC)  I25.118 CT CORONARY MORPH W/CTA COR W/SCORE W/CA W/CM &/OR WO/CM    CT CORONARY FRACTIONAL FLOW RESERVE DATA PREP    CT CORONARY FRACTIONAL FLOW RESERVE FLUID ANALYSIS    EKG 12/26/2019: Normal sinus rhythm with rate of 67 bpm, left axis deviation, left anterior fascicular block.  IVCD, incomplete left bundle branch block.  T wave normality, cannot exclude anterolateral ischemia.    Compared to EKG 03/13/2018, anterolateral T inversion new.   No orders of the defined types were placed in this encounter.   There are no discontinued medications.   Recommendations:   MORAYMA GODOWN  is a 69 y.o. 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 Christinia Gully, morbid obesity, severe obstructive sleep apnea on CPAP since August 2018 and follows Dr. Roddie Mc, hypertension, mild hyperlipidemia,  prediabetes, positive RNP antibody and also elevated ANA, had non-ischemic stress test in May 2019 and echocardiogram also performed at that time normal LVEF, moderate pulmonary HTN. Last seen in June 2019, now presents for routine follow up.   She has noticed worsening dyspnea on exertion.  She also has an abnormal EKG with marked T wave abnormality in anterolateral leads, previously she has had moderate coronary artery disease involving the proximal LAD.  Question is whether her EKG abnormalities related to worsening pulmonary hypertension secondary to underlying sarcoidosis and also has connective tissue disease and may indicate primary pulmonary hypertension versus progression of coronary artery disease.  I will set her up for coronary CTA, will also schedule repeat echocardiogram and I would like to see her back then.  I reviewed her labs, lipids are under excellent control, blood pressure is also well controlled.  No clinical evidence of congestive heart failure.  Adrian Prows, MD, [facility] 12/26/2019, 3:05 PM [facility]. [place] Office: [phone]

## 2020-01-05 ENCOUNTER — Other Ambulatory Visit: Payer: Self-pay | Admitting: Cardiology

## 2020-01-05 DIAGNOSIS — I25118 Atherosclerotic heart disease of native coronary artery with other forms of angina pectoris: Secondary | ICD-10-CM

## 2020-01-07 ENCOUNTER — Ambulatory Visit: Payer: BC Managed Care – PPO

## 2020-01-07 ENCOUNTER — Other Ambulatory Visit: Payer: Self-pay

## 2020-01-07 DIAGNOSIS — R0600 Dyspnea, unspecified: Secondary | ICD-10-CM

## 2020-01-07 DIAGNOSIS — R0609 Other forms of dyspnea: Secondary | ICD-10-CM

## 2020-01-09 ENCOUNTER — Other Ambulatory Visit: Payer: Self-pay

## 2020-01-09 ENCOUNTER — Encounter: Payer: Self-pay | Admitting: Internal Medicine

## 2020-01-09 ENCOUNTER — Ambulatory Visit: Payer: BC Managed Care – PPO | Admitting: Internal Medicine

## 2020-01-09 VITALS — BP 126/74 | HR 76 | Temp 98.0°F | Wt 233.0 lb

## 2020-01-09 DIAGNOSIS — Z6837 Body mass index (BMI) 37.0-37.9, adult: Secondary | ICD-10-CM

## 2020-01-09 DIAGNOSIS — Z23 Encounter for immunization: Secondary | ICD-10-CM

## 2020-01-09 DIAGNOSIS — R7309 Other abnormal glucose: Secondary | ICD-10-CM

## 2020-01-09 DIAGNOSIS — I10 Essential (primary) hypertension: Secondary | ICD-10-CM

## 2020-01-09 NOTE — Patient Instructions (Signed)

## 2020-01-10 ENCOUNTER — Encounter: Payer: Self-pay | Admitting: Internal Medicine

## 2020-01-10 LAB — CMP14+EGFR
ALT: 11 IU/L (ref 0–32)
AST: 16 IU/L (ref 0–40)
Albumin/Globulin Ratio: 1.6 (ref 1.2–2.2)
Albumin: 4.2 g/dL (ref 3.8–4.8)
Alkaline Phosphatase: 61 IU/L (ref 39–117)
BUN/Creatinine Ratio: 14 (ref 12–28)
BUN: 13 mg/dL (ref 8–27)
Bilirubin Total: 0.2 mg/dL (ref 0.0–1.2)
CO2: 22 mmol/L (ref 20–29)
Calcium: 9 mg/dL (ref 8.7–10.3)
Chloride: 107 mmol/L — ABNORMAL HIGH (ref 96–106)
Creatinine, Ser: 0.94 mg/dL (ref 0.57–1.00)
GFR calc Af Amer: 73 mL/min/{1.73_m2} (ref >59)
GFR calc non Af Amer: 63 mL/min/{1.73_m2} (ref >59)
Globulin, Total: 2.6 g/dL (ref 1.5–4.5)
Glucose: 95 mg/dL (ref 65–99)
Potassium: 4.2 mmol/L (ref 3.5–5.2)
Sodium: 143 mmol/L (ref 134–144)
Total Protein: 6.8 g/dL (ref 6.0–8.5)

## 2020-01-10 LAB — INSULIN, RANDOM: INSULIN: 22 u[IU]/mL (ref 2.6–24.9)

## 2020-01-10 LAB — HEMOGLOBIN A1C
Est. average glucose Bld gHb Est-mCnc: 117 mg/dL
Hgb A1c MFr Bld: 5.7 % — ABNORMAL HIGH (ref 4.8–5.6)

## 2020-01-12 NOTE — Progress Notes (Signed)
This visit occurred during the SARS-CoV-2 public health emergency.  Safety protocols were in place, including screening questions prior to the visit, additional usage of staff PPE, and extensive cleaning of exam room while observing appropriate contact time as indicated for disinfecting solutions.  Subjective:     Patient ID: Kristine [name] , female    DOB: 1951-03-05 , 69 y.o.   MRN: [id number]   Chief Complaint  Patient presents with  . Hypertension  . Immunizations    pneumonia / tdap    HPI  She is here today for BP check.   Hypertension This is a chronic problem. The current episode started more than 1 year ago. The problem has been gradually improving since onset. The problem is controlled. Pertinent negatives include no blurred vision, chest pain, palpitations or shortness of breath. Risk factors for coronary artery disease include obesity, post-menopausal state and sedentary lifestyle.     Past Medical History:  Diagnosis Date  . Allergy   . Arthritis   . Glaucoma    ROD  . Hypertension   . Sarcoidosis      Family History  Problem Relation Age of Onset  . Breast cancer Mother   . Lung cancer Mother   . Lung cancer Father        smoked  . Heart disease Maternal Grandmother      Current Outpatient Medications:  .  albuterol (PROAIR HFA) 108 (90 Base) MCG/ACT inhaler, Inhale 2 puffs into the lungs every 6 (six) hours as needed for wheezing or shortness of breath., Disp: 18 g, Rfl: 1 .  aspirin EC 81 MG tablet, Take 1 tablet (81 mg total) by mouth daily., Disp: , Rfl:  .  atorvastatin (LIPITOR) 10 MG tablet, TAKE ONE TABLET BY MOUTH DAILY, Disp: 90 tablet, Rfl: 2 .  Brinzolamide-Brimonidine (SIMBRINZA) 1-0.2 % SUSP, Apply 1 drop to eye 2 (two) times daily., Disp: , Rfl:  .  CALCIUM PO, Take by mouth., Disp: , Rfl:  .  Cholecalciferol (VITAMIN D3) 5000 UNITS CAPS, Take 5,000 Units by mouth daily., Disp: , Rfl:  .  Magnesium 400 MG CAPS, Take by mouth., Disp: ,  Rfl:  .  metoprolol succinate (TOPROL-XL) 25 MG 24 hr tablet, TAKE ONE TABLET BY MOUTH DAILY, Disp: 90 tablet, Rfl: 0 .  telmisartan (MICARDIS) 80 MG tablet, Take 80 mg by mouth daily. , Disp: , Rfl:    No Known Allergies   Review of Systems  Constitutional: Negative.   Eyes: Negative for blurred vision.  Respiratory: Negative.  Negative for shortness of breath.   Cardiovascular: Negative.  Negative for chest pain and palpitations.  Gastrointestinal: Negative.   Neurological: Negative.   Psychiatric/Behavioral: Negative.      Today's Vitals   01/09/20 1101  BP: 126/74  Pulse: 76  Temp: 98 F (36.7 C)  TempSrc: Oral  Weight: 233 lb (105.7 kg)  PainSc: 0-No pain   Body mass index is 37.61 kg/m.   Wt Readings from Last 3 Encounters:  01/09/20 233 lb (105.7 kg)  12/26/19 235 lb 9.6 oz (106.9 kg)  07/30/19 238 lb 9.6 oz (108.2 kg)    Objective:  Physical Exam Vitals and nursing note reviewed.  Constitutional:      Appearance: Normal appearance. She is obese.  HENT:     Head: Normocephalic and atraumatic.  Cardiovascular:     Rate and Rhythm: Normal rate and regular rhythm.     Heart sounds: Normal heart sounds.  Pulmonary:  Effort: Pulmonary effort is normal.     Breath sounds: Normal breath sounds.  Skin:    General: Skin is warm.  Neurological:     General: No focal deficit present.     Mental Status: She is alert.  Psychiatric:        Mood and Affect: Mood normal.        Behavior: Behavior normal.         Assessment And Plan:     1. Essential hypertension  Chronic, well controlled.  She will continue with current meds. She is encouraged to avoid adding salt to her foods. I will check renal function.   - CMP14+EGFR  2. Other abnormal glucose  HER A1C HAS BEEN ELEVATED IN THE PAST. I WILL CHECK AN A1C, BMET TODAY. SHE WAS ENCOURAGED TO AVOID SUGARY BEVERAGES AND PROCESSED FOODS INCLUDNG BREADS, RICE AND PASTA.  We discussed the use of Rybelsus to  help address this issue. She denies family history of thyroid cancer. Possible side effects were discussed with the patient in full detail. Also reminded to stop eating when full. I will make further recommendations once her labs are available for review.  - Hemoglobin A1c - Insulin, random([phone])  3. Class 2 severe obesity due to excess calories with serious comorbidity and body mass index (BMI) of 37.0 to 37.9 in adult [facility])  She was congratulated on her five pound weight loss since Sept 2020. She is encouraged to exercise 30 minutes five days per week. Our goal is a BMI less than 30 to decrease cardiac risk.    4. Immunization due  Since she has started her COVID vaccine series, I will defer administering Tdap and Prevnar-13 until later this year. She is in agreement with her treatment plan.   [name], MD    THE PATIENT IS ENCOURAGED TO PRACTICE SOCIAL DISTANCING DUE TO THE COVID-19 PANDEMIC.

## 2020-01-15 ENCOUNTER — Other Ambulatory Visit: Payer: Self-pay | Admitting: Cardiology

## 2020-01-15 ENCOUNTER — Other Ambulatory Visit: Payer: Self-pay | Admitting: Internal Medicine

## 2020-01-24 ENCOUNTER — Telehealth (HOSPITAL_COMMUNITY): Payer: Self-pay | Admitting: Emergency Medicine

## 2020-01-24 LAB — BASIC METABOLIC PANEL
BUN/Creatinine Ratio: 18 (ref 12–28)
BUN: 17 mg/dL (ref 8–27)
CO2: 22 mmol/L (ref 20–29)
Calcium: 9.2 mg/dL (ref 8.7–10.3)
Chloride: 108 mmol/L — ABNORMAL HIGH (ref 96–106)
Creatinine, Ser: 0.92 mg/dL (ref 0.57–1.00)
GFR calc Af Amer: 75 mL/min/{1.73_m2} (ref >59)
GFR calc non Af Amer: 65 mL/min/{1.73_m2} (ref >59)
Glucose: 109 mg/dL — ABNORMAL HIGH (ref 65–99)
Potassium: 4.3 mmol/L (ref 3.5–5.2)
Sodium: 144 mmol/L (ref 134–144)

## 2020-01-24 NOTE — Telephone Encounter (Signed)
Reaching out to patient to offer assistance regarding upcoming cardiac imaging study; pt verbalizes understanding of appt date/time, parking situation and where to check in, pre-test NPO status and medications ordered, and verified current allergies; name and call back number provided for further questions should they arise Kristine Bond RN [facility] [phone] office [phone] cell   Pt states she did not receive instructions for CCTA from [facility] office.  These were reviewed entirely over the phone. Pt appreciated the call.  Kristine [name]

## 2020-01-25 ENCOUNTER — Other Ambulatory Visit: Payer: Self-pay

## 2020-01-25 ENCOUNTER — Ambulatory Visit (HOSPITAL_COMMUNITY)
Admission: RE | Admit: 2020-01-25 | Discharge: 2020-01-25 | Disposition: A | Discharge status: Home / Self Care | Payer: BC Managed Care – PPO | Source: Ambulatory Visit | Attending: Cardiology | Admitting: Cardiology

## 2020-01-25 DIAGNOSIS — R0609 Other forms of dyspnea: Secondary | ICD-10-CM

## 2020-01-25 DIAGNOSIS — I25118 Atherosclerotic heart disease of native coronary artery with other forms of angina pectoris: Secondary | ICD-10-CM | POA: Insufficient documentation

## 2020-01-25 DIAGNOSIS — R0600 Dyspnea, unspecified: Secondary | ICD-10-CM

## 2020-01-25 MED ORDER — NITROGLYCERIN 0.4 MG SL SUBL: 0.8000 mg | SUBLINGUAL_TABLET | Freq: Once | SUBLINGUAL | Status: AC

## 2020-01-25 MED ORDER — IOHEXOL 350 MG/ML SOLN
80.0000 mL | Freq: Once | INTRAVENOUS | Status: AC | PRN
  Administered 2020-01-25: 80 mL via INTRAVENOUS

## 2020-01-25 MED ORDER — NITROGLYCERIN 0.4 MG SL SUBL
SUBLINGUAL_TABLET | SUBLINGUAL | Status: AC
  Administered 2020-01-25: 0.8 mg via SUBLINGUAL
  Filled 2020-01-25: qty 2

## 2020-01-26 ENCOUNTER — Ambulatory Visit (HOSPITAL_COMMUNITY)
Admission: RE | Admit: 2020-01-26 | Discharge: 2020-01-26 | Disposition: A | Discharge status: Home / Self Care | Payer: BC Managed Care – PPO | Source: Ambulatory Visit | Attending: Cardiology | Admitting: Cardiology

## 2020-01-26 DIAGNOSIS — R0600 Dyspnea, unspecified: Secondary | ICD-10-CM | POA: Diagnosis not present

## 2020-01-26 DIAGNOSIS — I25118 Atherosclerotic heart disease of native coronary artery with other forms of angina pectoris: Secondary | ICD-10-CM | POA: Insufficient documentation

## 2020-01-26 DIAGNOSIS — R0609 Other forms of dyspnea: Secondary | ICD-10-CM

## 2020-01-28 DIAGNOSIS — I25118 Atherosclerotic heart disease of native coronary artery with other forms of angina pectoris: Secondary | ICD-10-CM | POA: Diagnosis not present

## 2020-01-28 NOTE — Progress Notes (Signed)
Just FYI. Moderate CAD, medical therapy. JG

## 2020-02-04 ENCOUNTER — Other Ambulatory Visit: Payer: Self-pay

## 2020-02-04 ENCOUNTER — Ambulatory Visit (INDEPENDENT_AMBULATORY_CARE_PROVIDER_SITE_OTHER): Payer: BC Managed Care – PPO

## 2020-02-04 DIAGNOSIS — Z23 Encounter for immunization: Secondary | ICD-10-CM

## 2020-02-04 MED ORDER — TETANUS-DIPHTHERIA TOXOIDS TD 5-2 LFU IM INJ: 0.5000 mL | INJECTION | Freq: Once | INTRAMUSCULAR | Status: DC

## 2020-02-04 NOTE — Progress Notes (Signed)
Pt presents today for Tetanus/Tdap

## 2020-02-04 NOTE — Addendum Note (Signed)
Addended by: Candiss Norse T on: 02/04/2020 12:57 PM   Modules accepted: Orders

## 2020-02-25 ENCOUNTER — Other Ambulatory Visit: Payer: Self-pay

## 2020-02-25 ENCOUNTER — Ambulatory Visit (INDEPENDENT_AMBULATORY_CARE_PROVIDER_SITE_OTHER): Payer: BC Managed Care – PPO | Admitting: Cardiology

## 2020-02-25 ENCOUNTER — Encounter: Payer: Self-pay | Admitting: Cardiology

## 2020-02-25 VITALS — BP 130/76 | HR 71 | Temp 97.8°F | Resp 18 | Ht 65.0 in | Wt 238.0 lb

## 2020-02-25 DIAGNOSIS — E7800 Pure hypercholesterolemia, unspecified: Secondary | ICD-10-CM

## 2020-02-25 DIAGNOSIS — R0600 Dyspnea, unspecified: Secondary | ICD-10-CM

## 2020-02-25 DIAGNOSIS — R0609 Other forms of dyspnea: Secondary | ICD-10-CM

## 2020-02-25 DIAGNOSIS — I25118 Atherosclerotic heart disease of native coronary artery with other forms of angina pectoris: Secondary | ICD-10-CM

## 2020-02-25 NOTE — Progress Notes (Signed)
Primary Physician/Referring:  Glendale Chard, MD  Patient ID: Kristine [name], female    DOB: 1951/07/24, 69 y.o.   MRN: [id number]  Chief Complaint  Patient presents with  . Shortness of Breath  . Follow-up    6 week  . Results    CTA and Echo   HPI:    DANIQUE [name]  is a 69 y.o. 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 Christinia Gully, morbid obesity, severe obstructive sleep apnea on CPAP since August 2018 and follows Dr. Roddie Mc, hypertension, mild hyperlipidemia,  prediabetes, positive RNP antibody and also elevated ANA, had non-ischemic stress test in May 2019. Due to worsening dyspnea on exertion and abnormal EKG with marked T wave abnormality in anterolateral leads, underwent coronary CTA & repeat echocardiogram and now presents for follow-up.  She has noticed worsening symptoms of dyspnea over the past several months.  No PND or orthopnea.  No leg edema.  She has not had any chest pain.  Past Medical History:  Diagnosis Date  . Allergy   . Arthritis   . Glaucoma    ROD  . Hypertension   . Sarcoidosis    Past Surgical History:  Procedure Laterality Date  . ABDOMINAL HYSTERECTOMY    . BUNIONECTOMY     left  . CHOLECYSTECTOMY    . KNEE SURGERY     right  . LEFT HEART CATHETERIZATION WITH CORONARY ANGIOGRAM N/A 09/12/2014   Procedure: LEFT HEART CATHETERIZATION WITH CORONARY ANGIOGRAM;  Surgeon: Troy Sine, MD;  Location: [facility] CATH LAB;  Service: Cardiovascular;  Laterality: N/A;   Social History   Tobacco Use  . Smoking status: Never Smoker  . Smokeless tobacco: Never Used  Substance Use Topics  . Alcohol use: Yes    Comment: occ    ROS  Review of Systems  Cardiovascular: Positive for dyspnea on exertion. Negative for leg swelling.  Gastrointestinal: Negative for melena.   Objective  Blood pressure 130/76, pulse 71, temperature 97.8 F (36.6 C), temperature source Temporal, resp. rate 18, height 5' 5" (1.651 m), weight 238  lb (108 kg), SpO2 97 %.  Vitals with BMI 02/25/2020 02/04/2020 01/25/2020  Height 5' 5" 5' 5.8" -  Weight 238 lbs 236 lbs 3 oz -  BMI 80.99 83.38 -  Systolic 250 539 767  Diastolic 76 78 89  Pulse 71 84 69     Physical Exam  Neck: No thyromegaly present.  Cardiovascular: Normal rate, regular rhythm, normal heart sounds and intact distal pulses. Exam reveals no gallop.  No murmur heard. No leg edema, no JVD.  Pulmonary/Chest: Effort normal and breath sounds normal.  Abdominal: Soft. Bowel sounds are normal.  Obese and pannus is present.  Musculoskeletal:     Cervical back: Neck supple.  Skin: Skin is warm and dry.   Laboratory examination:   Recent Labs    07/09/19 1220 01/09/20 1747 01/23/20 1326  NA 143 143 144  K 4.3 4.2 4.3  CL 106 107* 108*  CO2 _0 GLUCOSE 92 95 109*  BUN _1 CREATININE 0.88 0.94 0.92  CALCIUM 9.6 9.0 9.2  GFRNONAA 69 63 65  GFRAA 79 73 75   CrCl cannot be calculated (Patient's most recent lab result is older than the maximum 21 days allowed.).  CMP Latest Ref Rng & Units 01/23/2020 01/09/2020 07/09/2019  Glucose 65 - 99 mg/dL 109(H) 95 92  BUN 8 - 27 mg/dL 17 13 22  Creatinine 0.57 - 1.00 mg/dL 0.92 0.94 0.88  Sodium 134 - 144 mmol/L 144 143 143  Potassium 3.5 - 5.2 mmol/L 4.3 4.2 4.3  Chloride 96 - 106 mmol/L 108(H) 107(H) 106  CO2 20 - 29 mmol/L 22 22 23  Calcium 8.7 - 10.3 mg/dL 9.2 9.0 9.6  Total Protein 6.0 - 8.5 g/dL - 6.8 -  Total Bilirubin 0.0 - 1.2 mg/dL - 0.2 -  Alkaline Phos 39 - 117 IU/L - 61 -  AST 0 - 40 IU/L - 16 -  ALT 0 - 32 IU/L - 11 -   CBC Latest Ref Rng & Units 05/16/2019 11/06/2018 07/06/2017  WBC 3.4 - 10.8 x10E3/uL 8.6 7.7 6.2  Hemoglobin 11.1 - 15.9 g/dL 13.1 12.1 12.4  Hematocrit 34.0 - 46.6 % 39.7 36.6 38.7  Platelets 150 - 450 x10E3/uL 267 226.0 273   Lipid Panel     Component Value Date/Time   CHOL 126 07/09/2019 1220   TRIG 74 07/09/2019 1220   HDL 52 07/09/2019 1220   CHOLHDL 2.4 07/09/2019  1220   CHOLHDL 4.1 04/05/2013 0858   VLDL 15 04/05/2013 0858   LDLCALC 59 07/09/2019 1220   HEMOGLOBIN A1C Lab Results  Component Value Date   HGBA1C 5.7 (H) 01/09/2020   MPG 117 (H) 07/11/2014   TSH Recent Labs    05/16/19 1526  TSH 2.870    External labs 03/14/2018: Cholesterol 175, triglycerides 78, HDL 47, LDL 112. Sedimentation rate 16.  12/27/2017: Hemoglobin A1c 6.2%. Creatinine 0.92, EGFR 76/66, potassium 4.0, BMP normal. Vitamin B12 308.  05/02/2017: Anti--DNA less than 1. RNP antibodies 1.1 elevated. Sjorgens negative. ANA direct positive. TSH 2.6. Vitamin D 39.  03/07/2017: Cholesterol 183, triglycerides 66, HDL 56, LDL 114.  Medications and allergies  No Known Allergies   Current Outpatient Medications  Medication Instructions  . albuterol (PROAIR HFA) 108 (90 Base) MCG/ACT inhaler 2 puffs, Inhalation, Every 6 hours PRN  . aspirin EC 81 mg, Oral, Daily  . atorvastatin (LIPITOR) 10 MG tablet TAKE ONE TABLET BY MOUTH DAILY  . Brinzolamide-Brimonidine (SIMBRINZA) 1-0.2 % SUSP 1 drop, Ophthalmic, 2 times daily  . CALCIUM PO Oral  . Magnesium 400 MG CAPS Oral  . metoprolol succinate (TOPROL-XL) 25 MG 24 hr tablet TAKE ONE TABLET BY MOUTH DAILY  . telmisartan (MICARDIS) 80 mg, Oral, Daily  . Vitamin D3 5,000 Units, Oral, Daily    Radiology:   Chest x-ray PA and lateral view 12/08/2017: No acute pneumonia nor CHF. Bilateral hilar lymphadenopathy and mild chronic interstitial prominence consistent with known sarcoidosis.  Cardiac Studies:   Coronary angiogram 09/12/2014: Normal LV systolic function at 55%, 40% ostial stenosis of the LAD, otherwise normal coronary arteries.  Exercise sestamibi stress test 03/20/2018: 1. The patient performed treadmill exercise using a Bruce protocol, completing 4:49 minutes. The patient completed an estimated workload of 6.81 METS, reaching 105% of the maximum predicted heart rate. No stress symptoms reported. Peak BP 220/98 mmHg. No  ischemic changes seen on stress electrocardiogram. 2. The overall quality of the study is good. There is no evidence of abnormal lung activity. Stress and rest SPECT images demonstrate homogeneous tracer distribution throughout the myocardium. Gated SPECT imaging reveals normal myocardial thickening and wall motion. The left ventricular ejection fraction was normal (56%). 3. Low risk study.  Echocardiogram 12/07/2019:  Left ventricle cavity is normal in size and wall thickness. Normal LV systolic function with EF 56%. Normal global wall motion. Unable to evaluate diastolic function due to   severity of mitral annular calcification. Calculated EF 56%.  Left atrial cavity is severely dilated.  Severe calcification of the mitral valve annulus. Mild (Grade I) mitral regurgitation.  Mild tricuspid regurgitation. Estimated pulmonary artery systolic pressure is 30 mmHg.  No significant change compared to previous study on 04/04/2018.   Coronary CTA 01/25/2020:  1. Coronary calcium score of 201. This was 89th percentile for age and sex matched control. 2. Normal coronary origin with right dominance. 3.  Mild (25-49%) calcified plaque in a small D1. 4.  Moderate (50-69%) mixed plaque in the mid LCX and OM2. 5.  Will send study for FFRct. 6.  Moderate mitral annular calcification. FFR findings consistent with nonobstructive coronary artery disease.  Extracardiac findings: Extensive mediastinal and bilateral hilar lymphadenopathy, compatible with reported clinical history of sarcoidosis. A few scattered tiny 2-4 mm pulmonary nodules noted in the lungs bilaterally, nonspecific, but statistically likely benign in this patient with history of sarcoidosis.   Assessment     ICD-10-CM   1. Coronary artery disease involving native coronary artery of native heart with other form of angina pectoris ([place])  I25.118   2. Dyspnea on exertion  R06.00   3. Hypercholesteremia  E78.00     EKG 12/26/2019: Normal  sinus rhythm with rate of 67 bpm, left axis deviation, left anterior fascicular block.  IVCD, incomplete left bundle branch block.  T wave normality, cannot exclude anterolateral ischemia.   Compared to EKG 03/13/2018, anterolateral T inversion new.   No orders of the defined types were placed in this encounter.   There are no discontinued medications.   Recommendations:   MEOSHIA BILLING  is a 69 y.o. 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 Christinia Gully, morbid obesity, severe obstructive sleep apnea on CPAP since August 2018 and follows Dr. Roddie Mc, hypertension, mild hyperlipidemia,  prediabetes, positive RNP antibody and also elevated ANA, had non-ischemic stress test in May 2019. Due to worsening dyspnea on exertion and abnormal EKG with marked T wave abnormality in anterolateral leads, underwent coronary CTA & repeat echocardiogram and now presents for follow-up.  I reviewed the results of the CT scan, revealing extensive sarcoid disease, no significant obstructive coronary disease and hence I reassured her. Her dyspnea is related to sarcoid disease and also obesity ambulation and deconditioning.  Otherwise risk factors are well controlled including lipids and also hypertension.  Echocardiogram reviewed, in 2019 she had moderate pulmonary hypertension which is not evident in the present echocardiogram.  No RV strain.  Continue primary prevention for now, she is on statins, beta-blocker, ARB.  I will see her back on a as needed basis.  Adrian Prows, MD, Operating Room Services 02/25/2020, 1:19 PM [facility] [place] Office: [phone]

## 2020-03-06 ENCOUNTER — Telehealth: Payer: Self-pay | Admitting: Internal Medicine

## 2020-03-06 NOTE — Telephone Encounter (Signed)
I called and spoke with patient and made her aware that I will have to get permission from the doctors and we would be in contact with her. She verbalized understanding.   Dr. Melvyn Novas and Dr. Lamonte Sakai are you ok with this switch? Please advise.

## 2020-03-07 NOTE — Telephone Encounter (Signed)
Fine with me

## 2020-03-07 NOTE — Telephone Encounter (Signed)
Spoke with pt, scheduled OV.  Nothing further needed at this time- will close encounter.

## 2020-03-07 NOTE — Telephone Encounter (Signed)
[place] with me - 30 minute visit please

## 2020-04-07 LAB — HM PAP SMEAR: HM Pap smear: POSITIVE

## 2020-04-10 ENCOUNTER — Institutional Professional Consult (permissible substitution): Payer: BC Managed Care – PPO | Admitting: Emergency Medicine

## 2020-04-14 ENCOUNTER — Other Ambulatory Visit: Payer: Self-pay | Admitting: Internal Medicine

## 2020-04-20 ENCOUNTER — Other Ambulatory Visit: Payer: Self-pay | Admitting: Internal Medicine

## 2020-05-27 ENCOUNTER — Encounter: Payer: Self-pay | Admitting: Internal Medicine

## 2020-06-12 ENCOUNTER — Other Ambulatory Visit: Payer: Self-pay

## 2020-06-12 ENCOUNTER — Encounter: Payer: Self-pay | Admitting: Emergency Medicine

## 2020-06-12 ENCOUNTER — Ambulatory Visit: Payer: BC Managed Care – PPO | Admitting: Emergency Medicine

## 2020-06-12 VITALS — BP 144/76 | HR 78 | Temp 98.3°F | Ht 66.0 in | Wt 236.8 lb

## 2020-06-12 DIAGNOSIS — D869 Sarcoidosis, unspecified: Secondary | ICD-10-CM | POA: Diagnosis not present

## 2020-06-12 NOTE — Patient Instructions (Signed)
Lab work today We will arrange for full pulmonary function testing in next office visit We will perform a high-resolution CT scan of the chest without contrast and review next visit Follow with Dr. Lamonte Sakai next available with full pulmonary function testing on the same day.

## 2020-06-12 NOTE — Progress Notes (Signed)
Subjective:    Patient ID: Kristine [name], female    DOB: 04-19-1951, 69 y.o.   MRN: [id number]  HPI 69 year old never smoker who has been followed in our office by Dr. 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 Follows with Dr Nadyne Coombes for her dyspnea, had a reassuring coronary CT as below.  The SOB has been worse over the last year, can happen with exertion or at rest. No cough, does hear occasional wheeze. She has lost 30 lbs over the last year. Denies any significant allergy sx.   Pulmonary function testing 11/06/2028 reviewed by me, show normal airflows and lung volumes without a bronchodilator response.  She has a slightly decrease diffusion capacity that corrects to normal range when adjusted for alveolar volume  Coronary CT done 01/25/2020 showed a calcium score of 201.  There is extensive mediastinal bilateral hilar lymphadenopathy with a few scattered 2 to 4 mm pulmonary nodules bilaterally without any significant interstitial disease.  TTE 01/07/20 reviewed, normal LV fxn EF 56%, dilated LA, estimated PASP 30 mmHg   Review of Systems As per HPI  Past Medical History:  Diagnosis Date  . Allergy   . Arthritis   . Glaucoma    ROD  . Hypertension   . Sarcoidosis      Family History  Problem Relation Age of Onset  . Breast cancer Mother   . Lung cancer Mother   . Lung cancer Father        smoked  . Heart disease Maternal Grandmother   . Hypertension Brother   . Liver cancer Brother   . Pneumonia Brother      Social History   Socioeconomic History  . Marital status: Married    Spouse name: Not on file  . Number of children: 2  . Years of education: Not on file  . Highest education level: Not on file  Occupational History  . Not on file  Tobacco Use  . Smoking status: Never Smoker  . Smokeless tobacco: Never Used  Vaping Use  . Vaping Use: Never used  Substance and Sexual Activity  . Alcohol use: Yes    Comment:  occ  . Drug use: No  . Sexual activity: Not on file    Comment: HYST   Other Topics Concern  . Not on file  Social History Narrative  . Not on file   Social Determinants of Health   Financial Resource Strain:   . Difficulty of Paying Living Expenses:   Food Insecurity:   . Worried About Charity fundraiser in the Last Year:   . Arboriculturist in the Last Year:   Transportation Needs:   . Film/video editor (Medical):   Marland Kitchen Lack of Transportation (Non-Medical):   Physical Activity:   . Days of Exercise per Week:   . Minutes of Exercise per Session:   Stress:   . Feeling of Stress :   Social Connections:   . Frequency of Communication with Friends and Family:   . Frequency of Social Gatherings with Friends and Family:   . Attends Religious Services:   . Active Member of Clubs or Organizations:   . Attends Archivist Meetings:   Marland Kitchen Marital Status:   Intimate Partner Violence:   . Fear of Current or Ex-Partner:   . Emotionally Abused:   Marland Kitchen Physically Abused:   . 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 albuterol (PROAIR HFA) 108 (90 Base) MCG/ACT inhaler Inhale 2 puffs into the lungs every 6 (six) hours as needed for wheezing or shortness of breath. 18 g 1  . aspirin EC 81 MG tablet Take 1 tablet (81 mg total) by mouth daily.    Marland Kitchen atorvastatin (LIPITOR) 10 MG tablet TAKE ONE TABLET BY MOUTH DAILY 90 tablet 1  . Brinzolamide-Brimonidine (SIMBRINZA) 1-0.2 % SUSP Apply 1 drop to eye 2 (two) times daily.    Marland Kitchen CALCIUM PO Take by mouth.    . Cholecalciferol (VITAMIN D3) 5000 UNITS CAPS Take 5,000 Units by mouth daily.    . Magnesium 400 MG CAPS Take by mouth.    . metoprolol succinate (TOPROL-XL) 25 MG 24 hr tablet TAKE ONE TABLET BY MOUTH DAILY 90 tablet 1  . telmisartan (MICARDIS) 80 MG tablet TAKE ONE TABLET BY MOUTH DAILY  90 tablet 1   Facility-Administered Medications Prior to Visit  Medication Dose Route Frequency Provider Last Rate Last Admin  . tetanus & diphtheria toxoids (adult) (TENIVAC) injection 0.5 mL  0.5 mL Intramuscular Once Glendale Chard, MD             Objective:   Physical Exam  Vitals:   06/12/20 1113  BP: (!) 144/76  Pulse: 78  Temp: 98.3 F (36.8 C)  TempSrc: Oral  SpO2: 97%  Weight: (!) 236 lb 12.8 oz (107.4 kg)  Height: _0  (1.676 m)   Gen: Pleasant, overwt woman, in no distress,  normal affect  ENT: No lesions,  mouth clear,  oropharynx clear, no postnasal drip  Neck: No JVD, no stridor  Lungs: No use of accessory muscles, no crackles or wheezing on normal respiration, no wheeze on forced expiration  Cardiovascular: RRR, heart sounds normal, no murmur or gallops, no peripheral edema  Musculoskeletal: No deformities, no cyanosis or clubbing  Neuro: alert, awake, non focal  Skin: Warm, no lesions or rash       Assessment & Plan:  Sarcoidosis ([place]) History of iritis/uveitis and characteristic imaging all consistent with sarcoidosis.  Never biopsied.  She is having increased dyspnea, does occasionally benefit from S ABA.  I believe we need to repeat her PFT, imaging to assess for any evidence of active sarcoid, obstructive lung disease.  Lab work today >> ACE level We will arrange for full pulmonary function testing in next office visit We will perform a high-resolution CT scan of the chest without contrast and review next visit Follow with Dr. Lamonte Sakai next available with full pulmonary function testing on the same day.  Baltazar Apo, MD, PhD 06/12/2020, 11:38 AM [facility] [phone] or if no answer [phone]

## 2020-06-12 NOTE — Assessment & Plan Note (Signed)
History of iritis/uveitis and characteristic imaging all consistent with sarcoidosis.  Never biopsied.  She is having increased dyspnea, does occasionally benefit from S ABA.  I believe we need to repeat her PFT, imaging to assess for any evidence of active sarcoid, obstructive lung disease.  Lab work today >> ACE level We will arrange for full pulmonary function testing in next office visit We will perform a high-resolution CT scan of the chest without contrast and review next visit Follow with Dr. Lamonte Sakai next available with full pulmonary function testing on the same day.

## 2020-06-13 LAB — ANGIOTENSIN CONVERTING ENZYME: Angiotensin-Converting Enzyme: 26 U/L (ref 9–67)

## 2020-06-20 ENCOUNTER — Other Ambulatory Visit: Payer: Self-pay

## 2020-06-20 ENCOUNTER — Ambulatory Visit (INDEPENDENT_AMBULATORY_CARE_PROVIDER_SITE_OTHER)
Admission: RE | Admit: 2020-06-20 | Discharge: 2020-06-20 | Disposition: A | Discharge status: Home / Self Care | Payer: BC Managed Care – PPO | Source: Ambulatory Visit | Attending: Emergency Medicine | Admitting: Emergency Medicine

## 2020-06-20 DIAGNOSIS — D869 Sarcoidosis, unspecified: Secondary | ICD-10-CM | POA: Diagnosis not present

## 2020-07-13 ENCOUNTER — Other Ambulatory Visit: Payer: Self-pay | Admitting: Cardiology

## 2020-07-14 ENCOUNTER — Ambulatory Visit: Payer: BC Managed Care – PPO | Admitting: Internal Medicine

## 2020-07-14 ENCOUNTER — Encounter: Payer: Self-pay | Admitting: Internal Medicine

## 2020-07-14 ENCOUNTER — Other Ambulatory Visit: Payer: Self-pay

## 2020-07-14 VITALS — BP 132/82 | HR 71 | Temp 97.6°F | Ht 66.0 in | Wt 239.2 lb

## 2020-07-14 DIAGNOSIS — Z0000 Encounter for general adult medical examination without abnormal findings: Secondary | ICD-10-CM | POA: Diagnosis not present

## 2020-07-14 DIAGNOSIS — I10 Essential (primary) hypertension: Secondary | ICD-10-CM | POA: Diagnosis not present

## 2020-07-14 DIAGNOSIS — N3281 Overactive bladder: Secondary | ICD-10-CM

## 2020-07-14 DIAGNOSIS — E66812 Obesity, class 2: Secondary | ICD-10-CM

## 2020-07-14 DIAGNOSIS — Z6838 Body mass index (BMI) 38.0-38.9, adult: Secondary | ICD-10-CM

## 2020-07-14 LAB — POCT UA - MICROALBUMIN
Albumin/Creatinine Ratio, Urine, POC: <30
Creatinine, POC: 300 mg/dL
Microalbumin Ur, POC: 10 mg/L

## 2020-07-14 LAB — POCT URINALYSIS DIPSTICK
Bilirubin, UA: NEGATIVE
Blood, UA: NEGATIVE
Glucose, UA: NEGATIVE
Ketones, UA: NEGATIVE
Leukocytes, UA: NEGATIVE
Nitrite, UA: NEGATIVE
Protein, UA: NEGATIVE
Spec Grav, UA: >=1.03 — AB (ref 1.010–1.025)
Urobilinogen, UA: 0.2 E.U./dL
pH, UA: 5.5 (ref 5.0–8.0)

## 2020-07-14 MED ORDER — MIRABEGRON ER 25 MG PO TB24: 25.0000 mg | ORAL_TABLET | Freq: Every day | ORAL | 3 refills | Status: DC

## 2020-07-14 NOTE — Progress Notes (Signed)
I,[name],acting as a Education administrator for [name], MD.,have documented all relevant documentation on the behalf of [name], MD,as directed by  [name], MD while in the presence of [name], MD.  This visit occurred during the SARS-CoV-2 public health emergency.  Safety protocols were in place, including screening questions prior to the visit, additional usage of staff PPE, and extensive cleaning of exam room while observing appropriate contact time as indicated for disinfecting solutions.  Subjective:     Patient ID: Kristine [name] , female    DOB: 04-07-1951 , 69 y.o.   MRN: [id number]   Chief Complaint  Patient presents with  . Annual Exam  . Hypertension    HPI  She is here today for a full physical examination. She is followed by GYN, Dr. Charlesetta Garibaldi for her pelvic exams.   Hypertension This is a chronic problem. The current episode started more than 1 year ago. The problem has been gradually improving since onset. The problem is controlled. Pertinent negatives include no blurred vision, chest pain, palpitations or shortness of breath. Risk factors for coronary artery disease include sedentary lifestyle, obesity and post-menopausal state. The current treatment provides moderate improvement.     Past Medical History:  Diagnosis Date  . Allergy   . Arthritis   . Glaucoma    ROD  . Hypertension   . Sarcoidosis      Family History  Problem Relation Age of Onset  . Breast cancer Mother   . Lung cancer Mother   . Lung cancer Father        smoked  . Heart disease Maternal Grandmother   . Hypertension Brother   . Liver cancer Brother   . Pneumonia Brother      Current Outpatient Medications:  .  albuterol (PROAIR HFA) 108 (90 Base) MCG/ACT inhaler, Inhale 2 puffs into the lungs every 6 (six) hours as needed for wheezing or shortness of breath., Disp: 18 g, Rfl: 1 .  aspirin EC 81 MG tablet, Take 1 tablet (81 mg total) by mouth daily., Disp: , Rfl:   .  atorvastatin (LIPITOR) 10 MG tablet, TAKE ONE TABLET BY MOUTH DAILY, Disp: 90 tablet, Rfl: 1 .  Brinzolamide-Brimonidine (SIMBRINZA) 1-0.2 % SUSP, Apply 1 drop to eye 2 (two) times daily., Disp: , Rfl:  .  CALCIUM PO, Take by mouth., Disp: , Rfl:  .  Cholecalciferol (VITAMIN D3) 5000 UNITS CAPS, Take 5,000 Units by mouth daily., Disp: , Rfl:  .  Magnesium 400 MG CAPS, Take by mouth., Disp: , Rfl:  .  metoprolol succinate (TOPROL-XL) 25 MG 24 hr tablet, TAKE ONE TABLET BY MOUTH DAILY, Disp: 90 tablet, Rfl: 1 .  telmisartan (MICARDIS) 80 MG tablet, TAKE ONE TABLET BY MOUTH DAILY, Disp: 90 tablet, Rfl: 1 .  mirabegron ER (MYRBETRIQ) 25 MG TB24 tablet, Take 1 tablet (25 mg total) by mouth daily., Disp: 30 tablet, Rfl: 3  Current Facility-Administered Medications:  .  tetanus & diphtheria toxoids (adult) (TENIVAC) injection 0.5 mL, 0.5 mL, Intramuscular, Once, [place]endale Chard, MD   No Known Allergies    The patient states she uses post menopausal status for birth control. Last LMP was No LMP recorded. Patient has had a hysterectomy.. Negative for Dysmenorrhea. Negative for: breast discharge, breast lump(s), breast pain and breast self exam. Associated symptoms include abnormal vaginal bleeding. Pertinent negatives include abnormal bleeding (hematology), anxiety, decreased libido, depression, difficulty falling sleep, dyspareunia, history of infertility, nocturia, sexual dysfunction, sleep  disturbances, urinary incontinence, urinary urgency, vaginal discharge and vaginal itching. Diet regular.The patient states her exercise level is    . The patient's tobacco use is:  Social History   Tobacco Use  Smoking Status Never Smoker  Smokeless Tobacco Never Used  . She has been exposed to passive smoke. The patient's alcohol use is:    Review of Systems  Constitutional: Negative.   HENT: Negative.   Eyes: Negative.  Negative for blurred vision.  Respiratory: Negative.  Negative for shortness of  breath.   Cardiovascular: Negative.  Negative for chest pain and palpitations.  Gastrointestinal: Negative.   Endocrine: Negative.   Genitourinary: Positive for urgency.  Musculoskeletal: Negative.   Skin: Negative.   Allergic/Immunologic: Negative.   Neurological: Negative.   Hematological: Negative.   Psychiatric/Behavioral: Negative.      Today's Vitals   07/14/20 1001  BP: 132/82  Pulse: 71  Temp: 97.6 F (36.4 C)  TempSrc: Oral  Weight: 239 lb 3.2 oz (108.5 kg)  Height: _0  (1.676 m)  PainSc: 0-No pain   Body mass index is 38.61 kg/m.   Objective:  Physical Exam Vitals and nursing note reviewed.  Constitutional:      General: She is not in acute distress.    Appearance: Normal appearance. She is well-developed. She is obese.  HENT:     Head: Normocephalic and atraumatic.     Right Ear: Hearing, tympanic membrane, ear canal and external ear normal. There is no impacted cerumen.     Left Ear: Hearing, tympanic membrane, ear canal and external ear normal. There is no impacted cerumen.     Nose:     Comments: Deferred, masked    Mouth/Throat:     Comments: Deferred, masked Eyes:     General: Lids are normal.     Extraocular Movements: Extraocular movements intact.     Conjunctiva/sclera: Conjunctivae normal.     Pupils: Pupils are equal, round, and reactive to light.     Funduscopic exam:    Right eye: No papilledema.        Left eye: No papilledema.  Neck:     Thyroid: No thyroid mass.     Vascular: No carotid bruit.  Cardiovascular:     Rate and Rhythm: Normal rate and regular rhythm.     Pulses: Normal pulses.     Heart sounds: Normal heart sounds. No murmur heard.   Pulmonary:     Effort: Pulmonary effort is normal.     Breath sounds: Normal breath sounds.  Chest:     Breasts: Tanner Score is 5.        Right: Normal.        Left: Normal.  Abdominal:     General: Bowel sounds are normal. There is no distension.     Palpations: Abdomen is soft.      Tenderness: There is no abdominal tenderness.     Comments: Soft, pannus present. Difficult to assess organomegaly due to body habitus.   Genitourinary:    Rectum: Guaiac result negative.  Musculoskeletal:        General: No swelling. Normal range of motion.     Cervical back: Full passive range of motion without pain, normal range of motion and neck supple.     Right lower leg: No edema.     Left lower leg: No edema.  Skin:    General: Skin is warm and dry.     Capillary Refill: Capillary refill takes less than 2 seconds.  Neurological:     General: No focal deficit present.     Mental Status: She is alert and oriented to person, place, and time.     Cranial Nerves: No cranial nerve deficit.     Sensory: No sensory deficit.  Psychiatric:        Mood and Affect: Mood normal.        Behavior: Behavior normal.        Thought Content: Thought content normal.        Judgment: Judgment normal.         Assessment And Plan:     1. Routine general medical examination at health care facility Comments: A full exam was performed.  Importance of monthly self breast exams was discussed with the patient. PATIENT IS ADVISED TO GET 30-45 MINUTES REGULAR EXERCISE NO LESS THAN FOUR TO FIVE DAYS PER WEEK - BOTH WEIGHTBEARING EXERCISES AND AEROBIC ARE RECOMMENDED.  PATIENT IS ADVISED TO FOLLOW A HEALTHY DIET WITH AT LEAST SIX FRUITS/VEGGIES PER DAY, DECREASE INTAKE OF RED MEAT, AND TO INCREASE FISH INTAKE TO TWO DAYS PER WEEK.  MEATS/FISH SHOULD NOT BE FRIED, BAKED OR BROILED IS PREFERABLE.  I SUGGEST WEARING SPF 50 SUNSCREEN ON EXPOSED PARTS AND ESPECIALLY WHEN IN THE DIRECT SUNLIGHT FOR AN EXTENDED PERIOD OF TIME.  PLEASE AVOID FAST FOOD RESTAURANTS AND INCREASE YOUR WATER INTAKE.  - CBC - Hemoglobin A1c - CMP14+EGFR - Lipid panel - VITAMIN D 25 Hydroxy (Vit-D Deficiency, Fractures)  2. Essential hypertension Comments: Chronic, controlled.  She will continue with current meds. EKG performed,  NSR w/ IVCD and left axis. No new changes noted. She will rto in six months for re-evaluation.  - POCT UA - Microalbumin - POCT urinalysis dipstick - EKG 12-Lead  3. OAB (overactive bladder) Comments: Declined PT referral. Urinalysis obtained. If negative, I will give her samples of Myrbetriq, 22m once daily. If started, she will rto in six weeks for re-evaluation.   4. Class 2 severe obesity due to excess calories with serious comorbidity and body mass index (BMI) of 38.0 to 38.9 in adult ([facility] Readings from Last 3 Encounters:  07/14/20 239 lb 3.2 oz (108.5 kg)  06/12/20 (!) 236 lb 12.8 oz (107.4 kg)  02/25/20 238 lb (108 kg)   She is encouraged to strive for BMI less than 30 to decrease cardiac risk. Advised to aim for at least 150 minutes of exercise per week.  She has been doing [employer] [facility]   Patient was given opportunity to ask questions. Patient verbalized understanding of the plan and was able to repeat key elements of the plan. All questions were answered to their satisfaction.   RMaximino Greenland MD   I, RMaximino Greenland MD, have reviewed all documentation for this visit. The documentation on 07/14/20 for the exam, diagnosis, procedures, and orders are all accurate and complete.  THE PATIENT IS ENCOURAGED TO PRACTICE SOCIAL DISTANCING DUE TO THE COVID-19 PANDEMIC.

## 2020-07-14 NOTE — Patient Instructions (Signed)
Health Maintenance After Age 68 After age 39, you are at a higher risk for certain long-term diseases and infections as well as injuries from falls. Falls are a major cause of broken bones and head injuries in people who are older than age 66. Getting regular preventive care can help to keep you healthy and well. Preventive care includes getting regular testing and making lifestyle changes as recommended by your health care provider. Talk with your health care provider about:  Which screenings and tests you should have. A screening is a test that checks for a disease when you have no symptoms.  A diet and exercise plan that is right for you. What should I know about screenings and tests to prevent falls? Screening and testing are the best ways to find a health problem early. Early diagnosis and treatment give you the best chance of managing medical conditions that are common after age 8. Certain conditions and lifestyle choices may make you more likely to have a fall. Your health care provider may recommend:  Regular vision checks. Poor vision and conditions such as cataracts can make you more likely to have a fall. If you wear glasses, make sure to get your prescription updated if your vision changes.  Medicine review. Work with your health care provider to regularly review all of the medicines you are taking, including over-the-counter medicines. Ask your health care provider about any side effects that may make you more likely to have a fall. Tell your health care provider if any medicines that you take make you feel dizzy or sleepy.  Osteoporosis screening. Osteoporosis is a condition that causes the bones to get weaker. This can make the bones weak and cause them to break more easily.  Blood pressure screening. Blood pressure changes and medicines to control blood pressure can make you feel dizzy.  Strength and balance checks. Your health care provider may recommend certain tests to check your  strength and balance while standing, walking, or changing positions.  Foot health exam. Foot pain and numbness, as well as not wearing proper footwear, can make you more likely to have a fall.  Depression screening. You may be more likely to have a fall if you have a fear of falling, feel emotionally low, or feel unable to do activities that you used to do.  Alcohol use screening. Using too much alcohol can affect your balance and may make you more likely to have a fall. What actions can I take to lower my risk of falls? General instructions  Talk with your health care provider about your risks for falling. Tell your health care provider if: ? You fall. Be sure to tell your health care provider about all falls, even ones that seem minor. ? You feel dizzy, sleepy, or off-balance.  Take over-the-counter and prescription medicines only as told by your health care provider. These include any supplements.  Eat a healthy diet and maintain a healthy weight. A healthy diet includes low-fat dairy products, low-fat (lean) meats, and fiber from whole grains, beans, and lots of fruits and vegetables. Home safety  Remove any tripping hazards, such as rugs, cords, and clutter.  Install safety equipment such as grab bars in bathrooms and safety rails on stairs.  Keep rooms and walkways well-lit. Activity   Follow a regular exercise program to stay fit. This will help you maintain your balance. Ask your health care provider what types of exercise are appropriate for you.  If you need a cane or  walker, use it as recommended by your health care provider.  Wear supportive shoes that have nonskid soles. Lifestyle  Do not drink alcohol if your health care provider tells you not to drink.  If you drink alcohol, limit how much you have: ? 0-1 drink a day for women. ? 0-2 drinks a day for men.  Be aware of how much alcohol is in your drink. In the [place] one drink equals one typical bottle of beer (12  oz), one-half glass of wine (5 oz), or one shot of hard liquor (1 oz).  Do not use any products that contain nicotine or tobacco, such as cigarettes and e-cigarettes. If you need help quitting, ask your health care provider. Summary  Having a healthy lifestyle and getting preventive care can help to protect your health and wellness after age 18.  Screening and testing are the best way to find a health problem early and help you avoid having a fall. Early diagnosis and treatment give you the best chance for managing medical conditions that are more common for people who are older than age 56.  Falls are a major cause of broken bones and head injuries in people who are older than age 23. Take precautions to prevent a fall at home.  Work with your health care provider to learn what changes you can make to improve your health and wellness and to prevent falls. This information is not intended to replace advice given to you by your health care provider. Make sure you discuss any questions you have with your health care provider. Document Revised: 02/22/2019 Document Reviewed: 09/14/2017 Elsevier Patient Education  2020 [employer].

## 2020-07-15 LAB — CMP14+EGFR
ALT: 14 IU/L (ref 0–32)
AST: 14 IU/L (ref 0–40)
Albumin/Globulin Ratio: 1.6 (ref 1.2–2.2)
Albumin: 4.2 g/dL (ref 3.8–4.8)
Alkaline Phosphatase: 64 IU/L (ref 48–121)
BUN/Creatinine Ratio: 19 (ref 12–28)
BUN: 17 mg/dL (ref 8–27)
Bilirubin Total: 0.2 mg/dL (ref 0.0–1.2)
CO2: 22 mmol/L (ref 20–29)
Calcium: 9.8 mg/dL (ref 8.7–10.3)
Chloride: 104 mmol/L (ref 96–106)
Creatinine, Ser: 0.9 mg/dL (ref 0.57–1.00)
GFR calc Af Amer: 77 mL/min/{1.73_m2} (ref >59)
GFR calc non Af Amer: 66 mL/min/{1.73_m2} (ref >59)
Globulin, Total: 2.7 g/dL (ref 1.5–4.5)
Glucose: 108 mg/dL — ABNORMAL HIGH (ref 65–99)
Potassium: 4.4 mmol/L (ref 3.5–5.2)
Sodium: 140 mmol/L (ref 134–144)
Total Protein: 6.9 g/dL (ref 6.0–8.5)

## 2020-07-15 LAB — CBC
Hematocrit: 35.4 % (ref 34.0–46.6)
Hemoglobin: 11.8 g/dL (ref 11.1–15.9)
MCH: 26.6 pg (ref 26.6–33.0)
MCHC: 33.3 g/dL (ref 31.5–35.7)
MCV: 80 fL (ref 79–97)
Platelets: 244 10*3/uL (ref 150–450)
RBC: 4.43 x10E6/uL (ref 3.77–5.28)
RDW: 12.9 % (ref 11.7–15.4)
WBC: 6.3 10*3/uL (ref 3.4–10.8)

## 2020-07-15 LAB — LIPID PANEL
Chol/HDL Ratio: 2.4 ratio (ref 0.0–4.4)
Cholesterol, Total: 131 mg/dL (ref 100–199)
HDL: 55 mg/dL (ref >39)
LDL Chol Calc (NIH): 58 mg/dL (ref 0–99)
Triglycerides: 98 mg/dL (ref 0–149)
VLDL Cholesterol Cal: 18 mg/dL (ref 5–40)

## 2020-07-15 LAB — VITAMIN D 25 HYDROXY (VIT D DEFICIENCY, FRACTURES): Vit D, 25-Hydroxy: 46.2 ng/mL (ref 30.0–100.0)

## 2020-07-15 LAB — HEMOGLOBIN A1C
Est. average glucose Bld gHb Est-mCnc: 117 mg/dL
Hgb A1c MFr Bld: 5.7 % — ABNORMAL HIGH (ref 4.8–5.6)

## 2020-08-04 ENCOUNTER — Ambulatory Visit: Payer: BC Managed Care – PPO | Admitting: Emergency Medicine

## 2020-08-04 ENCOUNTER — Encounter: Payer: Self-pay | Admitting: Emergency Medicine

## 2020-08-04 ENCOUNTER — Ambulatory Visit (INDEPENDENT_AMBULATORY_CARE_PROVIDER_SITE_OTHER): Payer: BC Managed Care – PPO | Admitting: Emergency Medicine

## 2020-08-04 ENCOUNTER — Other Ambulatory Visit: Payer: Self-pay

## 2020-08-04 VITALS — BP 144/82 | HR 72 | Temp 97.1°F | Ht 67.0 in | Wt 239.6 lb

## 2020-08-04 DIAGNOSIS — D869 Sarcoidosis, unspecified: Secondary | ICD-10-CM

## 2020-08-04 DIAGNOSIS — Z23 Encounter for immunization: Secondary | ICD-10-CM | POA: Diagnosis not present

## 2020-08-04 DIAGNOSIS — R0600 Dyspnea, unspecified: Secondary | ICD-10-CM

## 2020-08-04 DIAGNOSIS — R0609 Other forms of dyspnea: Secondary | ICD-10-CM

## 2020-08-04 LAB — PULMONARY FUNCTION TEST
DL/VA % pred: 92 %
DL/VA: 3.78 ml/min/mmHg/L
DLCO cor % pred: 91 %
DLCO cor: 19.9 ml/min/mmHg
DLCO unc % pred: 91 %
DLCO unc: 19.9 ml/min/mmHg
FEF 25-75 Post: 2.21 L/sec
FEF 25-75 Pre: 1.98 L/sec
FEF2575-%Change-Post: 11 %
FEF2575-%Pred-Post: 109 %
FEF2575-%Pred-Pre: 97 %
FEV1-%Change-Post: 3 %
FEV1-%Pred-Post: 116 %
FEV1-%Pred-Pre: 112 %
FEV1-Post: 2.55 L
FEV1-Pre: 2.47 L
FEV1FVC-%Change-Post: 6 %
FEV1FVC-%Pred-Pre: 96 %
FEV6-%Change-Post: -2 %
FEV6-%Pred-Post: 118 %
FEV6-%Pred-Pre: 120 %
FEV6-Post: 3.2 L
FEV6-Pre: 3.27 L
FEV6FVC-%Change-Post: 0 %
FEV6FVC-%Pred-Post: 103 %
FEV6FVC-%Pred-Pre: 102 %
FVC-%Change-Post: -2 %
FVC-%Pred-Post: 114 %
FVC-%Pred-Pre: 117 %
FVC-Post: 3.2 L
FVC-Pre: 3.29 L
Post FEV1/FVC ratio: 80 %
Post FEV6/FVC ratio: 100 %
Pre FEV1/FVC ratio: 75 %
Pre FEV6/FVC Ratio: 99 %
RV % pred: 100 %
RV: 2.28 L
TLC % pred: 104 %
TLC: 5.73 L

## 2020-08-04 NOTE — Assessment & Plan Note (Signed)
Reassuring pulmonary function today without any evidence of a bronchodilator response.  No indication for maintenance medication.  She can keep albuterol available to use if/when she needs it

## 2020-08-04 NOTE — Assessment & Plan Note (Signed)
With reassuring CT and PFT as above.  Suspect an element of deconditioning.  She is going to work on weight loss, exercise.

## 2020-08-04 NOTE — Patient Instructions (Addendum)
Your CT scan of the chest and pulmonary function testing are both stable.  This is good news.  There is no evidence for active sarcoidosis. No evidence of active asthma on your pulmonary function testing.  It is okay to keep albuterol available to use 2 puffs if needed for shortness of breath, chest tightness, wheezing. Please follow with Dr. Lamonte Sakai in 1 year or sooner if you develop any changes in your breathing or any new respiratory symptoms. Flu shot today

## 2020-08-04 NOTE — Progress Notes (Signed)
   Subjective:    Patient ID: Kristine [name], female    DOB: 1951-05-10, 69 y.o.   MRN: [id number]  HPI 69 year old never smoker who has been followed in our office by Dr. 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 Follows with Dr Nadyne Coombes for her dyspnea, had a reassuring coronary CT as below.  The SOB has been worse over the last year, can happen with exertion or at rest. No cough, does hear occasional wheeze. She has lost 30 lbs over the last year. Denies any significant allergy sx.   Pulmonary function testing 11/06/2018 reviewed by me, show normal airflows and lung volumes without a bronchodilator response.  She has a slightly decrease diffusion capacity that corrects to normal range when adjusted for alveolar volume  Coronary CT done 01/25/2020 showed a calcium score of 201.  There is extensive mediastinal bilateral hilar lymphadenopathy with a few scattered 2 to 4 mm pulmonary nodules bilaterally without any significant interstitial disease.  TTE 01/07/20 reviewed, normal LV fxn EF 56%, dilated LA, estimated PASP 30 mmHg   ROV 08/04/20 --pleasant 69 year old woman who follows up today for her history of sarcoidosis, initially diagnosed by iritis.  She has slow progressive dyspnea over several years time.  CT chest 01/25/2020 reassuring without any evidence of interstitial disease as above.  We repeated her pulmonary function testing today which I have reviewed, shows normal lung volumes, normal spirometry without a bronchodilator response, normal DLCO.  Her FEV1 is 112% predicted, stable compared with 11/06/2018.  High-resolution CT scan of the chest done on 06/20/2020 reviewed by me, shows no evidence of interstitial lung disease possible patchy air trapping, moderate bilateral hilar mediastinal lymphadenopathy with some suspected calcification, scattered perilymphatic solid pulmonary nodules consistent with sarcoid. She is still working, is quite busy.  Still has exertional SOB, stable. Minimal cough, does have to clear some mucous most mornings. Her exertional SOB is less frequent, she is working on losing some wt.   MDM reviewed office notes from Dr. Baird Cancer 07/14/2020   Review of Systems As per HPI     Objective:   Physical Exam  Vitals:   08/04/20 1008  BP: (!) 144/82  Pulse: 72  Temp: (!) 97.1 F (36.2 C)  TempSrc: Temporal  SpO2: 98%  Weight: 239 lb 9.6 oz (108.7 kg)  Height: _0  (1.702 m)   Gen: Pleasant, overwt woman, in no distress,  normal affect  ENT: No lesions,  mouth clear,  oropharynx clear, no postnasal drip  Neck: No JVD, no stridor  Lungs: No use of accessory muscles, no crackles or wheezing on normal respiration, no wheeze on forced expiration  Cardiovascular: RRR, heart sounds normal, no murmur or gallops, no peripheral edema  Musculoskeletal: No deformities, no cyanosis or clubbing  Neuro: alert, awake, non focal  Skin: Warm, no lesions or rash       Assessment & Plan:  No problem-specific Assessment & Plan notes found for this encounter.  Baltazar Apo, MD, PhD 08/04/2020, 10:11 AM [facility] [phone] or if no answer [phone]

## 2020-08-04 NOTE — Assessment & Plan Note (Signed)
Reviewed high-resolution CT scan of the chest and pulmonary function testing with her today.  Both stable.  No evidence of active sarcoidosis.  No ILD.  Plan to follow her clinically and decide on repeat imaging, physiology depending on how she is doing.  Probably at some point in the next 2 years.

## 2020-08-04 NOTE — Progress Notes (Signed)
PFT done today.

## 2020-08-18 ENCOUNTER — Telehealth: Payer: Self-pay

## 2020-08-18 NOTE — Telephone Encounter (Signed)
The pt was asked if she takes her telmisartan daily and the pt said no because she forgets to take it sometimes.

## 2020-09-09 ENCOUNTER — Ambulatory Visit: Payer: BC Managed Care – PPO | Attending: Internal Medicine

## 2020-09-09 DIAGNOSIS — Z23 Encounter for immunization: Secondary | ICD-10-CM

## 2020-09-09 NOTE — Progress Notes (Signed)
   Covid-19 Vaccination [facility]  Name:  RUT BETTERTON    MRN: [id number] DOB: 1950-12-26  09/09/2020  Ms. Dupuy was observed post Covid-19 immunization for 15 minutes without incident. She was provided with Vaccine Information Sheet and instruction to access the V-Safe system.   Ms. Payer was instructed to call 911 with any severe reactions post vaccine: Marland Kitchen Difficulty breathing  . Swelling of face and throat  . A fast heartbeat  . A bad rash all over body  . Dizziness and weakness

## 2020-10-11 ENCOUNTER — Other Ambulatory Visit: Payer: Self-pay | Admitting: Internal Medicine

## 2020-11-24 ENCOUNTER — Other Ambulatory Visit: Payer: Self-pay

## 2020-11-24 DIAGNOSIS — Z20822 Contact with and (suspected) exposure to covid-19: Secondary | ICD-10-CM

## 2020-11-24 NOTE — Addendum Note (Signed)
Addended by: Reggy Eye on: 11/24/2020 07:48 PM   Modules accepted: Orders

## 2020-11-27 LAB — SARS-COV-2, NAA 2 DAY TAT

## 2020-11-27 LAB — NOVEL CORONAVIRUS, NAA: SARS-CoV-2, NAA: NOT DETECTED

## 2020-12-19 ENCOUNTER — Other Ambulatory Visit: Payer: Self-pay | Admitting: Internal Medicine

## 2021-01-02 ENCOUNTER — Encounter: Payer: Self-pay | Admitting: Internal Medicine

## 2021-01-02 LAB — HM DIABETES EYE EXAM

## 2021-01-09 ENCOUNTER — Other Ambulatory Visit: Payer: Self-pay | Admitting: Cardiology

## 2021-01-12 ENCOUNTER — Encounter: Payer: Self-pay | Admitting: Internal Medicine

## 2021-01-12 ENCOUNTER — Ambulatory Visit: Payer: BC Managed Care – PPO | Admitting: Internal Medicine

## 2021-01-12 ENCOUNTER — Other Ambulatory Visit: Payer: Self-pay

## 2021-01-12 VITALS — BP 124/80 | HR 73 | Temp 97.1°F | Ht 67.0 in | Wt 233.6 lb

## 2021-01-12 DIAGNOSIS — N3281 Overactive bladder: Secondary | ICD-10-CM | POA: Diagnosis not present

## 2021-01-12 DIAGNOSIS — I10 Essential (primary) hypertension: Secondary | ICD-10-CM

## 2021-01-12 DIAGNOSIS — R0602 Shortness of breath: Secondary | ICD-10-CM | POA: Diagnosis not present

## 2021-01-12 DIAGNOSIS — R7309 Other abnormal glucose: Secondary | ICD-10-CM

## 2021-01-12 DIAGNOSIS — I2510 Atherosclerotic heart disease of native coronary artery without angina pectoris: Secondary | ICD-10-CM

## 2021-01-12 DIAGNOSIS — Z6836 Body mass index (BMI) 36.0-36.9, adult: Secondary | ICD-10-CM

## 2021-01-12 DIAGNOSIS — Z9989 Dependence on other enabling machines and devices: Secondary | ICD-10-CM

## 2021-01-12 DIAGNOSIS — G4733 Obstructive sleep apnea (adult) (pediatric): Secondary | ICD-10-CM

## 2021-01-12 LAB — POCT URINALYSIS DIPSTICK
Bilirubin, UA: NEGATIVE
Blood, UA: NEGATIVE
Glucose, UA: NEGATIVE
Ketones, UA: NEGATIVE
Leukocytes, UA: NEGATIVE
Nitrite, UA: NEGATIVE
Protein, UA: NEGATIVE
Spec Grav, UA: >=1.03 — AB (ref 1.010–1.025)
Urobilinogen, UA: 0.2 E.U./dL
pH, UA: 5.5 (ref 5.0–8.0)

## 2021-01-12 NOTE — Patient Instructions (Signed)

## 2021-01-12 NOTE — Progress Notes (Signed)
I,Katawbba Wiggins,acting as a Education administrator for [name], MD.,have documented all relevant documentation on the behalf of [name], MD,as directed by  [name], MD while in the presence of [name], MD.  This visit occurred during the SARS-CoV-2 public health emergency.  Safety protocols were in place, including screening questions prior to the visit, additional usage of staff PPE, and extensive cleaning of exam room while observing appropriate contact time as indicated for disinfecting solutions.  Subjective:     Patient ID: Kristine [name] , female    DOB: Aug 21, 1951 , 70 y.o.   MRN: [id number]   Chief Complaint  Patient presents with  . Hypertension    HPI  The patient is here today for blood pressure follow-up.  She reports compliance with meds. She reports compliance with telmisartan and metoprolol. She is not having any problems with the medication.   Hypertension This is a chronic problem. The current episode started more than 1 year ago. The problem has been gradually improving since onset. The problem is controlled. Associated symptoms include shortness of breath. Pertinent negatives include no blurred vision, chest pain or palpitations. Risk factors for coronary artery disease include sedentary lifestyle, obesity and post-menopausal state. The current treatment provides moderate improvement.     Past Medical History:  Diagnosis Date  . Allergy   . Arthritis   . Glaucoma    ROD  . Hypertension   . Sarcoidosis      Family History  Problem Relation Age of Onset  . Breast cancer Mother   . Lung cancer Mother   . Lung cancer Father        smoked  . Heart disease Maternal Grandmother   . Hypertension Brother   . Liver cancer Brother   . Pneumonia Brother      Current Outpatient Medications:  .  aspirin EC 81 MG tablet, Take 1 tablet (81 mg total) by mouth daily., Disp: , Rfl:  .  atorvastatin (LIPITOR) 10 MG tablet, TAKE ONE TABLET BY MOUTH  DAILY, Disp: 90 tablet, Rfl: 1 .  Brinzolamide-Brimonidine 1-0.2 % SUSP, Apply 1 drop to eye 2 (two) times daily., Disp: , Rfl:  .  CALCIUM PO, Take by mouth., Disp: , Rfl:  .  Cholecalciferol (VITAMIN D3) 5000 UNITS CAPS, Take 5,000 Units by mouth daily., Disp: , Rfl:  .  Magnesium 400 MG CAPS, Take by mouth., Disp: , Rfl:  .  metoprolol succinate (TOPROL-XL) 25 MG 24 hr tablet, TAKE ONE TABLET BY MOUTH DAILY, Disp: 90 tablet, Rfl: 1 .  telmisartan (MICARDIS) 80 MG tablet, TAKE ONE TABLET BY MOUTH DAILY, Disp: 90 tablet, Rfl: 1 .  albuterol (PROAIR HFA) 108 (90 Base) MCG/ACT inhaler, Inhale 2 puffs into the lungs every 6 (six) hours as needed for wheezing or shortness of breath. (Patient not taking: Reported on 01/12/2021), Disp: 18 g, Rfl: 1 .  mirabegron ER (MYRBETRIQ) 25 MG TB24 tablet, Take 1 tablet (25 mg total) by mouth daily. (Patient not taking: Reported on 01/12/2021), Disp: 30 tablet, Rfl: 3  Current Facility-Administered Medications:  .  tetanus & diphtheria toxoids (adult) (TENIVAC) injection 0.5 mL, 0.5 mL, Intramuscular, Once, Glendale Chard, MD   No Known Allergies   Review of Systems  Constitutional: Positive for fatigue.  Eyes: Negative for blurred vision.  Respiratory: Positive for shortness of breath.   Cardiovascular: Negative.  Negative for chest pain and palpitations.  Gastrointestinal: Negative.   Psychiatric/Behavioral: Negative.   All other systems  reviewed and are negative.    Today's Vitals   01/12/21 0958  BP: 124/80  Pulse: 73  Temp: (!) 97.1 F (36.2 C)  TempSrc: Oral  Weight: 233 lb 9.6 oz (106 kg)  Height: _0  (1.702 m)   Body mass index is 36.59 kg/m.  Wt Readings from Last 3 Encounters:  01/12/21 233 lb 9.6 oz (106 kg)  08/04/20 239 lb 9.6 oz (108.7 kg)  07/14/20 239 lb 3.2 oz (108.5 kg)   Objective:  Physical Exam Vitals and nursing note reviewed.  Constitutional:      Appearance: Normal appearance. She is obese.  HENT:     Head:  Normocephalic and atraumatic.     Ears:     Comments: Masked     Nose:     Comments: Masked  Cardiovascular:     Rate and Rhythm: Normal rate and regular rhythm.     Heart sounds: Normal heart sounds.  Pulmonary:     Breath sounds: Normal breath sounds.  Musculoskeletal:     Cervical back: Normal range of motion.  Skin:    General: Skin is warm.  Neurological:     General: No focal deficit present.     Mental Status: She is alert and oriented to person, place, and time.         Assessment And Plan:     1. Essential hypertension Comments: Chronic, well controlled. She is encouraged to follow low sodium diet. I will check renal function today.  - CMP14+EGFR  2. Shortness of breath Comments: She reports her sx are worsening. She is encouraged to f/u with Cardiology. Has h/o nonobstructive CAD. Will schedule f/u with Cardiology. - CBC with Diff  3. Coronary artery disease involving native coronary artery of native heart, unspecified whether angina present Comments: Coronary CT results reviewed during her visit, results pos for non-obstructive CAD. Pt advised this could be contributing to her sx if this has worsened.  4. Other abnormal glucose Comments: Her a1c has been elevated in the past. I will recheck this today. She is encouraged to avoid sugary beverages.  - Hemoglobin A1c  5. OSA on CPAP Comments: Importance of CPAP compliance was stressed to the patient. Pt advised that noncompliance is likely contributing to her fatigue.   6. OAB (overactive bladder) Comments: I will check urinalysis. She was given samples of Myrbetriq 62m to take once daily x 7 days, then increase to 544m  - POCT Urinalysis Dipstick (81002)  7. Class 2 severe obesity due to excess calories with serious comorbidity and body mass index (BMI) of 36.0 to 36.9 in adult [facility]: She was congratulated on her 6 pound weight loss in six months. She has [facility] [employer] [facility] the [facility]. [facility]  - Amb Ref to Medical Weight Management  Patient was given opportunity to ask questions. Patient verbalized understanding of the plan and was able to repeat key elements of the plan. All questions were answered to their satisfaction.   I, RoMaximino GreenlandMD, have reviewed all documentation for this visit. The documentation on 01/12/21 for the exam, diagnosis, procedures, and orders are all accurate and complete.  THE PATIENT IS ENCOURAGED TO PRACTICE SOCIAL DISTANCING DUE TO THE COVID-19 PANDEMIC.

## 2021-01-13 ENCOUNTER — Ambulatory Visit: Payer: BC Managed Care – PPO | Admitting: Student

## 2021-01-13 ENCOUNTER — Encounter: Payer: Self-pay | Admitting: Student

## 2021-01-13 VITALS — BP 128/75 | HR 73 | Temp 96.2°F | Resp 17 | Ht 67.0 in | Wt 236.0 lb

## 2021-01-13 DIAGNOSIS — G4739 Other sleep apnea: Secondary | ICD-10-CM

## 2021-01-13 DIAGNOSIS — D862 Sarcoidosis of lung with sarcoidosis of lymph nodes: Secondary | ICD-10-CM

## 2021-01-13 DIAGNOSIS — I25118 Atherosclerotic heart disease of native coronary artery with other forms of angina pectoris: Secondary | ICD-10-CM

## 2021-01-13 DIAGNOSIS — R0600 Dyspnea, unspecified: Secondary | ICD-10-CM

## 2021-01-13 DIAGNOSIS — E7800 Pure hypercholesterolemia, unspecified: Secondary | ICD-10-CM

## 2021-01-13 DIAGNOSIS — R0609 Other forms of dyspnea: Secondary | ICD-10-CM

## 2021-01-13 LAB — CBC WITH DIFFERENTIAL/PLATELET
Basophils Absolute: 0.1 10*3/uL (ref 0.0–0.2)
Basos: 1 %
EOS (ABSOLUTE): 0.2 10*3/uL (ref 0.0–0.4)
Eos: 2 %
Hematocrit: 39 % (ref 34.0–46.6)
Hemoglobin: 12.8 g/dL (ref 11.1–15.9)
Immature Grans (Abs): 0 10*3/uL (ref 0.0–0.1)
Immature Granulocytes: 0 %
Lymphocytes Absolute: 2.1 10*3/uL (ref 0.7–3.1)
Lymphs: 22 %
MCH: 26.2 pg — ABNORMAL LOW (ref 26.6–33.0)
MCHC: 32.8 g/dL (ref 31.5–35.7)
MCV: 80 fL (ref 79–97)
Monocytes Absolute: 0.8 10*3/uL (ref 0.1–0.9)
Monocytes: 8 %
Neutrophils Absolute: 6.4 10*3/uL (ref 1.4–7.0)
Neutrophils: 67 %
Platelets: 263 10*3/uL (ref 150–450)
RBC: 4.89 x10E6/uL (ref 3.77–5.28)
RDW: 13.6 % (ref 11.7–15.4)
WBC: 9.5 10*3/uL (ref 3.4–10.8)

## 2021-01-13 LAB — CMP14+EGFR
ALT: 15 IU/L (ref 0–32)
AST: 11 IU/L (ref 0–40)
Albumin/Globulin Ratio: 1.7 (ref 1.2–2.2)
Albumin: 4.5 g/dL (ref 3.8–4.8)
Alkaline Phosphatase: 62 IU/L (ref 44–121)
BUN/Creatinine Ratio: 21 (ref 12–28)
BUN: 22 mg/dL (ref 8–27)
Bilirubin Total: 0.5 mg/dL (ref 0.0–1.2)
CO2: 22 mmol/L (ref 20–29)
Calcium: 9.8 mg/dL (ref 8.7–10.3)
Chloride: 106 mmol/L (ref 96–106)
Creatinine, Ser: 1.04 mg/dL — ABNORMAL HIGH (ref 0.57–1.00)
Globulin, Total: 2.7 g/dL (ref 1.5–4.5)
Glucose: 108 mg/dL — ABNORMAL HIGH (ref 65–99)
Potassium: 4.9 mmol/L (ref 3.5–5.2)
Sodium: 141 mmol/L (ref 134–144)
Total Protein: 7.2 g/dL (ref 6.0–8.5)
eGFR: 59 mL/min/{1.73_m2} — ABNORMAL LOW (ref >59)

## 2021-01-13 LAB — HEMOGLOBIN A1C
Est. average glucose Bld gHb Est-mCnc: 120 mg/dL
Hgb A1c MFr Bld: 5.8 % — ABNORMAL HIGH (ref 4.8–5.6)

## 2021-01-13 NOTE — Progress Notes (Signed)
Primary Physician/Referring:  Glendale Chard, MD  Patient ID: Kristine [name], female    DOB: 1951/09/29, 70 y.o.   MRN: [id number]  Chief Complaint  Patient presents with  . Shortness of Breath  . Follow-up   HPI:    KOREE [name]  is a 70 y.o. 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 Christinia Gully, morbid obesity, severe obstructive sleep apnea on CPAP since August 2018 and follows Dr. Roddie Mc, hypertension, mild hyperlipidemia,  prediabetes, positive RNP antibody and also elevated ANA, had non-ischemic stress test in May 2019. Due to worsening dyspnea on exertion and abnormal EKG with marked T wave abnormality in anterolateral leads, underwent coronary CTA & repeat echocardiogram in 2021 revealing no obstructive CAD normal LVEF.   Patient was seen by pulmonary team in 07/2020 at which time her PFTs and CT of the chest were both stable without evidence of active sarcoidosis, it was suspected dyspnea was related to deconditioning. She has no upcoming appointment with pulmonolgy.   Patient presents for urgent visit with concerns of shortness of breath worsening over the last 6 months. Patient was last seen in our office in 02/2020 by Dr. Einar Gip, at which time she was stable from a cardiovascular standpoint and recommended for as needed follow up.  Patient reports over the last 6 months she is noticed worsening dyspnea on exertion, even with minimal activities.  States typically improves after 3 to 5 minutes of rest.  Denies associated chest pain, dizziness, lightheadedness, syncope, near syncope.  She has history of obstructive sleep apnea, previously on CPAP however she admits she is not currently using her CPAP regularly.  Notably over the last 2 months patient has been sleeping with 2 pillows, previously using only 1. Although patient denies PND or orthopnea, as well as leg swelling. In the past patient has been evaluated for similar symptoms, felt by Dr. Einar Gip to be due  to underlying pulmonary sarcoidosis, obesity, deconditioning. She has used albuterol inhaler in the past with improvement of shortness of breath, however she reports she has not tried using her inhaler in the last several months.   She is currently in the process of establishing care with [place] [facility] for weight loss.   Past Medical History:  Diagnosis Date  . Allergy   . Arthritis   . Glaucoma    ROD  . Hypertension   . Sarcoidosis    Past Surgical History:  Procedure Laterality Date  . ABDOMINAL HYSTERECTOMY    . BUNIONECTOMY     left  . CHOLECYSTECTOMY    . KNEE SURGERY     right  . LEFT HEART CATHETERIZATION WITH CORONARY ANGIOGRAM N/A 09/12/2014   Procedure: LEFT HEART CATHETERIZATION WITH CORONARY ANGIOGRAM;  Surgeon: Troy Sine, MD;  Location[facility] CATH LAB;  Service: Cardiovascular;  Laterality: N/A;   Social History   Tobacco Use  . Smoking status: Never Smoker  . Smokeless tobacco: Never Used  Substance Use Topics  . Alcohol use: Yes    Comment: occ    ROS  Review of Systems  Constitutional: Negative for malaise/fatigue and weight gain.  Cardiovascular: Positive for dyspnea on exertion. Negative for chest pain, claudication, leg swelling, near-syncope, orthopnea, palpitations, paroxysmal nocturnal dyspnea and syncope.  Hematologic/Lymphatic: Does not bruise/bleed easily.  Gastrointestinal: Negative for melena.  Neurological: Negative for dizziness and weakness.   Objective  Blood pressure 128/75, pulse 73, temperature (!) 96.2 F (35.7 C), resp. rate 17, height 5'  7" (1.702 m), weight 236 lb (107 kg), SpO2 97 %.  Vitals with BMI 01/13/2021 01/12/2021 08/04/2020  Height _0  _1  _2   Weight 236 lbs 233 lbs 10 oz 239 lbs 10 oz  BMI 36.95 93.26 71.24  Systolic 580 998 338  Diastolic 75 80 82  Pulse 73 73 72     Physical Exam Vitals reviewed.  HENT:     Head: Normocephalic and atraumatic.  Neck:     Thyroid: No thyromegaly.   Cardiovascular:     Rate and Rhythm: Normal rate and regular rhythm.     Pulses: Intact distal pulses.     Heart sounds: Normal heart sounds, S1 normal and S2 normal. No murmur heard. No gallop.      Comments: No leg edema, no JVD. Pulmonary:     Effort: Pulmonary effort is normal. No respiratory distress.     Breath sounds: Normal breath sounds. No wheezing, rhonchi or rales.  Abdominal:     General: Bowel sounds are normal. There is no distension.     Palpations: Abdomen is soft.     Comments: Obese and pannus is present.  Musculoskeletal:     Cervical back: Neck supple.     Right lower leg: No edema.     Left lower leg: No edema.  Skin:    General: Skin is warm and dry.     Capillary Refill: Capillary refill takes less than 2 seconds.  Neurological:     General: No focal deficit present.     Mental Status: She is alert and oriented to person, place, and time.    Laboratory examination:   Recent Labs    01/23/20 1326 07/14/20 1112 01/12/21 1057  NA 144 140 141  K 4.3 4.4 4.9  CL 108* 104 106  CO2 _3 GLUCOSE 109* 108* 108*  BUN _4 CREATININE 0.92 0.90 1.04*  CALCIUM 9.2 9.8 9.8  GFRNONAA 65 66  --   GFRAA 75 77  --    estimated creatinine clearance is 65.2 mL/min (A) (by C-G formula based on SCr of 1.04 mg/dL (H)).  CMP Latest Ref Rng & Units 01/12/2021 07/14/2020 01/23/2020  Glucose 65 - 99 mg/dL 108(H) 108(H) 109(H)  BUN 8 - 27 mg/dL _5 Creatinine 0.57 - 1.00 mg/dL 1.04(H) 0.90 0.92  Sodium 134 - 144 mmol/L 141 140 144  Potassium 3.5 - 5.2 mmol/L 4.9 4.4 4.3  Chloride 96 - 106 mmol/L 106 104 108(H)  CO2 20 - 29 mmol/L _6 Calcium 8.7 - 10.3 mg/dL 9.8 9.8 9.2  Total Protein 6.0 - 8.5 g/dL 7.2 6.9 -  Total Bilirubin 0.0 - 1.2 mg/dL 0.5 0.2 -  Alkaline Phos 44 - 121 IU/L 62 64 -  AST 0 - 40 IU/L 11 14 -  ALT 0 - 32 IU/L 15 14 -   CBC Latest Ref Rng & Units 01/12/2021 07/14/2020 05/16/2019  WBC 3.4 - 10.8 x10E3/uL 9.5 6.3 8.6   Hemoglobin 11.1 - 15.9 g/dL 12.8 11.8 13.1  Hematocrit 34.0 - 46.6 % 39.0 35.4 39.7  Platelets 150 - 450 x10E3/uL 263 244 267   Lipid Panel     Component Value Date/Time   CHOL 131 07/14/2020 1109   TRIG 98 07/14/2020 1109   HDL 55 07/14/2020 1109   CHOLHDL 2.4 07/14/2020 1109   CHOLHDL 4.1 04/05/2013 0858   VLDL 15 04/05/2013 0858   LDLCALC 58 07/14/2020 1109  HEMOGLOBIN A1C Lab Results  Component Value Date   HGBA1C 5.8 (H) 01/12/2021   MPG 117 (H) 07/11/2014   TSH No results for input(s): TSH in the last 8760 hours.  External labs  03/14/2018: Cholesterol 175, triglycerides 78, HDL 47, LDL 112. Sedimentation rate 16.  12/27/2017: Hemoglobin A1c 6.2%. Creatinine 0.92, EGFR 76/66, potassium 4.0, BMP normal. Vitamin B12 308.  05/02/2017: Anti--DNA less than 1. RNP antibodies 1.1 elevated. Sjorgens negative. ANA direct positive. TSH 2.6. Vitamin D 39.  03/07/2017: Cholesterol 183, triglycerides 66, HDL 56, LDL 114.  Medications and allergies  No Known Allergies   Current Outpatient Medications  Medication Instructions  . albuterol (PROAIR HFA) 108 (90 Base) MCG/ACT inhaler 2 puffs, Inhalation, Every 6 hours PRN  . aspirin EC 81 mg, Oral, Daily  . atorvastatin (LIPITOR) 10 MG tablet TAKE ONE TABLET BY MOUTH DAILY  . Brinzolamide-Brimonidine 1-0.2 % SUSP 1 drop, Ophthalmic, 2 times daily  . CALCIUM PO Oral  . Magnesium 400 MG CAPS Oral  . metoprolol succinate (TOPROL-XL) 25 MG 24 hr tablet TAKE ONE TABLET BY MOUTH DAILY  . telmisartan (MICARDIS) 80 MG tablet TAKE ONE TABLET BY MOUTH DAILY  . Vitamin D3 5,000 Units, Oral, Daily    Radiology:   Chest x-ray PA and lateral view 12/08/2017: No acute pneumonia nor CHF. Bilateral hilar lymphadenopathy and mild chronic interstitial prominence consistent with known sarcoidosis.  Cardiac Studies:   Coronary angiogram 09/12/2014: Normal LV systolic function at 49%, 40% ostial stenosis of the LAD, otherwise normal coronary  arteries.  Exercise sestamibi stress test 03/20/2018: 1. The patient performed treadmill exercise using a Bruce protocol, completing 4:49 minutes. The patient completed an estimated workload of 6.81 METS, reaching 105% of the maximum predicted heart rate. No stress symptoms reported. Peak BP 220/98 mmHg. No ischemic changes seen on stress electrocardiogram. 2. The overall quality of the study is good. There is no evidence of abnormal lung activity. Stress and rest SPECT images demonstrate homogeneous tracer distribution throughout the myocardium. Gated SPECT imaging reveals normal myocardial thickening and wall motion. The left ventricular ejection fraction was normal (56%). 3. Low risk study.  Echocardiogram 12/07/2019:  Left ventricle cavity is normal in size and wall thickness. Normal LV systolic function with EF 56%. Normal global wall motion. Unable to evaluate diastolic function due to severity of mitral annular calcification. Calculated EF 56%.  Left atrial cavity is severely dilated.  Severe calcification of the mitral valve annulus. Mild (Grade I) mitral regurgitation.  Mild tricuspid regurgitation. Estimated pulmonary artery systolic pressure is 30 mmHg.  No significant change compared to previous study on 04/04/2018.   Coronary CTA 01/25/2020:  1. Coronary calcium score of 201. This was 89th percentile for age and sex matched control. 2. Normal coronary origin with right dominance. 3.  Mild (25-49%) calcified plaque in a small D1. 4.  Moderate (50-69%) mixed plaque in the mid LCX and OM2. 5.  Will send study for FFRct. 6.  Moderate mitral annular calcification.  FFR findings consistent with nonobstructive coronary artery disease.  Extracardiac findings: Extensive mediastinal and bilateral hilar lymphadenopathy, compatible with reported clinical history of sarcoidosis. A few scattered tiny 2-4 mm pulmonary nodules noted in the lungs bilaterally, nonspecific, but statistically  likely benign in this patient with history of sarcoidosis.    EKG   EKG 01/13/2021: Sinus rhythm rate of 76 bpm.  Left axis, left anterior fascicular block.  Poor progression, cannot exclude anteroseptal infarct old.  LVH with secondary ST-T abnormalities, cannot  exclude anterolateral ischemia.  EKG 12/26/2019: Normal sinus rhythm with rate of 67 bpm, left axis deviation, left anterior fascicular block.  IVCD, incomplete left bundle branch block.  T wave normality, cannot exclude anterolateral ischemia.   Compared to EKG 03/13/2018, anterolateral T inversion new.  Assessment     ICD-10-CM   1. Dyspnea on exertion  R06.00 EKG 12-Lead    PCV ECHOCARDIOGRAM COMPLETE    Brain natriuretic peptide  2. 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 Melvyn Novas  D86.2   3. Hypercholesteremia  E78.00   4. Coronary artery disease involving native coronary artery of native heart with other form of angina pectoris ([place])  I25.118 PCV ECHOCARDIOGRAM COMPLETE    Brain natriuretic peptide  5. Other sleep apnea  G47.39 Ambulatory referral to Sleep Studies     No orders of the defined types were placed in this encounter.   Medications Discontinued During This Encounter  Medication Reason  . mirabegron ER (MYRBETRIQ) 25 MG TB24 tablet Error     Recommendations:   JARELI HIGHLAND  is a 70 y.o. 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 Christinia Gully, morbid obesity, severe obstructive sleep apnea on CPAP since August 2018 and follows Dr. Roddie Mc, hypertension, mild hyperlipidemia,  prediabetes, positive RNP antibody and also elevated ANA, had non-ischemic stress test in May 2019. Due to worsening dyspnea on exertion and abnormal EKG with marked T wave abnormality in anterolateral leads, underwent coronary CTA & repeat echocardiogram in 2021 revealing no obstructive CAD normal LVEF.   Patient presents for urgent visit with concerns of shortness of breath worsening over the  last 6 months. Suspect dyspnea to be multifactorial including obesity, deconditioning, underlying pulmonary sarcoidosis, struct of sleep apnea noncompliant with CPAP.  In view of worsening dyspnea as well as potential orthopnea, will obtain BNP and echocardiogram.  Patient does have multiple cardiovascular risk factors, however she had coronary CTA less than 1 year ago revealing nonobstructive coronary artery disease and calcium score 201.  Patient is presently on appropriate medical therapy including aspirin, atorvastatin, metoprolol, and telmisartan.  Lipids are under excellent control.  EKG days without evidence of ischemia or underlying injury pattern, or ACS however does reveal LVH with secondary changes cannot exclude ischemia.  We will plan to repeat EKG at follow-up visit.  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  Dyspnea may be related to underlying worsening pulmonary hypertension.  Will await echocardiogram results.  If echocardiogram and lab results are unyielding, can consider repeat ischemic evaluation in the future although my suspicion for obstructive coronary artery disease is low given recent CTA.  I have personally reviewed external labs, renal function is stable and lipids are under excellent control.  Advised patient follow-up with PCP regarding use of albuterol inhaler.  Counseled patient regarding signs and symptoms that would warrant urgent/emergent evaluation, patient verbalized understanding and agreement.   Alethia Berthold, PA-C 01/13/2021, 10:50 AM Office: [phone]

## 2021-01-14 ENCOUNTER — Other Ambulatory Visit: Payer: Self-pay | Admitting: Internal Medicine

## 2021-01-15 LAB — BRAIN NATRIURETIC PEPTIDE: BNP: 17.4 pg/mL (ref 0.0–100.0)

## 2021-01-18 NOTE — Progress Notes (Signed)
Please inform patient BNP is normal, not indicative of acute  heart failure.

## 2021-01-19 NOTE — Progress Notes (Signed)
Called and spoke with patient regarding her lab results.

## 2021-01-22 ENCOUNTER — Other Ambulatory Visit: Payer: Self-pay

## 2021-01-22 ENCOUNTER — Ambulatory Visit: Payer: BC Managed Care – PPO

## 2021-01-22 DIAGNOSIS — R0609 Other forms of dyspnea: Secondary | ICD-10-CM

## 2021-01-22 DIAGNOSIS — R0600 Dyspnea, unspecified: Secondary | ICD-10-CM

## 2021-01-22 DIAGNOSIS — I25118 Atherosclerotic heart disease of native coronary artery with other forms of angina pectoris: Secondary | ICD-10-CM

## 2021-01-26 NOTE — Progress Notes (Signed)
Please inform patient no change from previous echo, will discuss more at next office visit.

## 2021-01-27 NOTE — Progress Notes (Signed)
Called and spoke to pt regarding echo results. Pt voiced understanding.

## 2021-01-29 ENCOUNTER — Encounter (INDEPENDENT_AMBULATORY_CARE_PROVIDER_SITE_OTHER): Payer: Self-pay | Admitting: Bariatrics

## 2021-01-29 ENCOUNTER — Other Ambulatory Visit: Payer: Self-pay

## 2021-01-29 ENCOUNTER — Ambulatory Visit (INDEPENDENT_AMBULATORY_CARE_PROVIDER_SITE_OTHER): Payer: BC Managed Care – PPO | Admitting: Bariatrics

## 2021-01-29 VITALS — BP 115/72 | HR 71 | Temp 98.0°F | Ht 66.0 in | Wt 233.0 lb

## 2021-01-29 DIAGNOSIS — R0602 Shortness of breath: Secondary | ICD-10-CM | POA: Diagnosis not present

## 2021-01-29 DIAGNOSIS — Z0289 Encounter for other administrative examinations: Secondary | ICD-10-CM

## 2021-01-29 DIAGNOSIS — R5383 Other fatigue: Secondary | ICD-10-CM

## 2021-01-29 DIAGNOSIS — Z1331 Encounter for screening for depression: Secondary | ICD-10-CM | POA: Diagnosis not present

## 2021-01-29 DIAGNOSIS — E559 Vitamin D deficiency, unspecified: Secondary | ICD-10-CM

## 2021-01-29 DIAGNOSIS — Z9189 Other specified personal risk factors, not elsewhere classified: Secondary | ICD-10-CM | POA: Diagnosis not present

## 2021-01-29 DIAGNOSIS — I10 Essential (primary) hypertension: Secondary | ICD-10-CM | POA: Diagnosis not present

## 2021-01-29 DIAGNOSIS — Z6837 Body mass index (BMI) 37.0-37.9, adult: Secondary | ICD-10-CM

## 2021-01-29 DIAGNOSIS — R7303 Prediabetes: Secondary | ICD-10-CM

## 2021-01-29 DIAGNOSIS — E785 Hyperlipidemia, unspecified: Secondary | ICD-10-CM | POA: Diagnosis not present

## 2021-01-29 DIAGNOSIS — G4733 Obstructive sleep apnea (adult) (pediatric): Secondary | ICD-10-CM

## 2021-01-30 LAB — T3: T3, Total: 126 ng/dL (ref 71–180)

## 2021-01-30 LAB — INSULIN, RANDOM: INSULIN: 19.3 u[IU]/mL (ref 2.6–24.9)

## 2021-01-30 LAB — VITAMIN D 25 HYDROXY (VIT D DEFICIENCY, FRACTURES): Vit D, 25-Hydroxy: 45.9 ng/mL (ref 30.0–100.0)

## 2021-01-30 LAB — T4, FREE: Free T4: 1.06 ng/dL (ref 0.82–1.77)

## 2021-01-30 LAB — TSH: TSH: 2.56 u[IU]/mL (ref 0.450–4.500)

## 2021-02-03 ENCOUNTER — Encounter (INDEPENDENT_AMBULATORY_CARE_PROVIDER_SITE_OTHER): Payer: Self-pay | Admitting: Bariatrics

## 2021-02-03 NOTE — Progress Notes (Signed)
Dear Dr. Baird [name],   Thank you for referring Kristine [name] to our clinic. The following note includes my evaluation and treatment recommendations.  Chief Complaint:   OBESITY Kristine [name] (MR# [id number]) is a 70 y.o. female who presents for evaluation and treatment of obesity and related comorbidities. Current BMI is Body mass index is 37.61 kg/m. Kristine [name] has been struggling with her weight for many years and has been unsuccessful in either losing weight, maintaining weight loss, or reaching her healthy weight goal.  Kristine [name] is currently in the action stage of change and ready to dedicate time achieving and maintaining a healthier weight. Kristine [name] is interested in becoming our patient and working on intensive lifestyle modifications including (but not limited to) diet and exercise for weight loss.  Kristine [name] likes to [employer], but struggles with coming up with a meal.  She skips breakfast.  Kristine [name]'s habits were [name] today and are as follows: Her family eats meals together, she thinks her family [name] eat healthier with her, her desired weight loss is 55 pounds, she has been heavy most of her life, she started gaining weight after having her last child, her heaviest weight ever was 257 pounds, she craves chocolate milkshakes, she skips breakfast frequently, she is frequently drinking liquids with calories, she frequently makes poor food choices, she frequently eats larger portions than normal and she struggles with emotional eating.  Depression Screen Kristine [name]'s Food and Mood (modified PHQ-9) score was 6.  Depression screen PHQ 2/9 01/29/2021  Decreased Interest 1  Down, Depressed, Hopeless 0  PHQ - 2 Score 1  Altered sleeping 2  Tired, decreased energy 1  Change in appetite 1  Feeling bad or failure about yourself  0  Trouble concentrating 1  Moving slowly or fidgety/restless 0  Suicidal thoughts 0  PHQ-9 Score 6  Difficult doing work/chores Not difficult at all    Subjective:   1. Other fatigue Kristine [name] admits to daytime somnolence and denies waking up still tired. Patent has a history of symptoms of daytime fatigue and snoring. Kristine [name] generally gets 6 hours of sleep per night, and states that she has poor quality sleep. Snoring is present. Apneic episodes are present. Epworth Sleepiness Score is 3.  Occurs with certain activities.   2. SOBOE (shortness of breath on exertion) Kristine [name] notes increasing shortness of breath with exercising and seems to be worsening over time with weight gain. She notes getting out of breath sooner with activity than she used to. This has gotten worse recently. Kristine [name] denies shortness of breath at rest or orthopnea.  Occurs with certain activities.   3. Essential hypertension Controlled.  Review: taking medications as instructed, no medication side effects noted, no chest pain on exertion, no dyspnea on exertion, no swelling of ankles.  She is taking metoprolol and telmisartan.  BP Readings from Last 3 Encounters:  01/29/21 115/72  01/13/21 128/75  01/12/21 124/80   4. Hyperlipidemia, unspecified hyperlipidemia type Kristine [name] has hyperlipidemia and has been trying to improve her cholesterol levels with intensive lifestyle modification including a low saturated fat diet, exercise and weight loss. She denies any chest pain, claudication or myalgias.  Taking atorvastatin.  Lab Results  Component Value Date   ALT 15 01/12/2021   AST 11 01/12/2021   ALKPHOS 62 01/12/2021   BILITOT 0.5 01/12/2021   Lab Results  Component Value Date   CHOL 131 07/14/2020   HDL 55 07/14/2020   LDLCALC 58 07/14/2020   TRIG 98 07/14/2020  CHOLHDL 2.4 07/14/2020   5. Vitamin D deficiency Kristine [name]'s Vitamin D level was 46.2 on 07/14/2020. She is currently taking OTC vitamin D 5,000 IU each day. She denies nausea, vomiting or muscle weakness.  6. OSA (obstructive sleep apnea) Kristine [name] has a diagnosis of sleep apnea.  She has a CPAP  machine.  She has an appointment with Neurology.  7. Prediabetes Kristine [name] has a diagnosis of prediabetes based on her elevated HgA1c and was informed this puts her at greater [name] of developing diabetes. She continues to work on diet and exercise to decrease her [name] of diabetes. She denies nausea or hypoglycemia.  She is on no medication for this.  A1c is 5.8.  Lab Results  Component Value Date   HGBA1C 5.8 (H) 01/12/2021   Lab Results  Component Value Date   INSULIN 19.3 01/29/2021   INSULIN 22.0 01/09/2020   8. Depression screen Kristine [name] was screened for depression as part of her new patient workup today.  PHQ-9 is 3.  9. At [name] for activity intolerance Kristine [name] is at [name] for activity intolerance due to obesity.  Assessment/Plan:   1. Other fatigue Kristine [name] does feel that her weight is causing her energy to be lower than it should be. Fatigue may be related to obesity, depression or many other causes. Labs [name] be ordered, and in the meanwhile, Kristine [name] including making healthy food choices, increasing physical activity and focusing on stress reduction.  Gradually increase activity.  [name] check thyroid panel today.  - T3 - T4, free - TSH  2. SOBOE (shortness of breath on exertion) Kristine [name] does feel that she gets out of breath more easily that she used to when she exercises. Kristine [name] shortness of breath appears to be obesity related and exercise induced. She has agreed to work on weight loss and gradually increase exercise to treat her exercise induced shortness of breath. [name] continue to monitor closely.  Gradually increase activity.  3. Essential hypertension Kristine [name] is working on healthy weight loss and exercise to improve blood pressure control. Kristine [name] watch for signs of hypotension as she continues her lifestyle modifications.  Continue medications.  4. Hyperlipidemia, unspecified hyperlipidemia type Kristine [name] and specific lipid/LDL  goals [name].  Kristine [name] continue to work on diet, exercise and weight loss efforts. Orders and follow up as documented in patient record.  Continue medication.  Counseling Intensive lifestyle modifications are the first line treatment for this issue. . Dietary changes: Increase soluble fiber. Decrease simple carbohydrates. . Exercise changes: Moderate to vigorous-intensity aerobic activity 150 minutes per week if tolerated. . Lipid-lowering medications: see documented in medical record.  5. Vitamin D deficiency Low Vitamin D level contributes to fatigue and are associated with obesity, breast, and colon [name]. She agrees to continue to take vitamin D 5,000 IU daily and Kristine [name] check her vitamin D level today.  - VITAMIN D 25 Hydroxy (Vit-D Deficiency, Fractures)  6. OSA (obstructive sleep apnea) Intensive lifestyle modifications are the first line treatment for this issue. Kristine discussed several lifestyle modifications today and she [name] continue to work on diet, exercise and weight loss efforts. Kristine [name] continue to monitor.  She [name] follow-up with Neurology.  Her appointment is scheduled next month.  7. Prediabetes Kristine [name] [name] continue to work on weight loss, exercise, and decreasing simple carbohydrates to help decrease the [name] of diabetes.  Decrease carbohydrates, increase activities, check insulin level today.  - Insulin, random  8. Depression screen Depression screen is negative today.  9. At [name] for activity intolerance Kristine [name] was given approximately 15 minutes of exercise intolerance counseling today. She is 70 y.o. female and has [name] factors exercise intolerance including obesity. Kristine discussed intensive lifestyle modifications today with an emphasis on specific weight loss instructions and strategies. Kristine [name] [name] slowly increase activity as tolerated.  Repetitive spaced learning was employed today to elicit  superior memory formation and behavioral change.  10. Class 2 severe obesity with serious comorbidity and body mass index (BMI) of 37.0 to 37.9 in adult, unspecified obesity type (HCC)  Kristine [name] is currently in the action stage of change and her goal is to continue with weight loss efforts. I recommend Kristine [name] begin the structured treatment plan as follows:  She has agreed to the Category 1 Plan +100 calories.  She [name] work on meal planning.  Labs from 01/12/2021, including CMP, CBC, and A1c, were [name] with the patient.  Exercise goals: No exercise has been prescribed at this time.   Behavioral modification strategies: increasing lean protein intake, decreasing simple carbohydrates, increasing vegetables, increasing water intake, decreasing eating out, no skipping meals, meal planning and cooking strategies, keeping healthy foods in the home and planning for success.  She was informed of the importance of frequent follow-up visits to maximize her success with intensive lifestyle modifications for her multiple health conditions. She was informed Kristine would discuss her lab results at her next visit unless there is a critical issue that needs to be addressed sooner. Kristine [name] agreed to keep her next visit at the agreed upon time to discuss these results.  Objective:   Blood pressure 115/72, pulse 71, temperature 98 F (36.7 C), height _0  (1.676 m), weight 233 lb (105.7 kg), SpO2 97 %. Body mass index is 37.61 kg/m.  EKG:  Performed on 01/13/2021.  Indirect Calorimeter completed today shows a VO2 of 228 and a REE of 1590.  Her calculated basal metabolic rate is 2119 thus her basal metabolic rate is worse than expected.  General: Cooperative, alert, well developed, in no acute distress. HEENT: Conjunctivae and lids unremarkable. Kristine: Regular rhythm.  Lungs: Normal work of breathing. Neurologic: No focal deficits.   Lab Results  Component Value Date   CREATININE 1.04 (H)  01/12/2021   BUN 22 01/12/2021   NA 141 01/12/2021   K 4.9 01/12/2021   CL 106 01/12/2021   CO2 22 01/12/2021   Lab Results  Component Value Date   ALT 15 01/12/2021   AST 11 01/12/2021   ALKPHOS 62 01/12/2021   BILITOT 0.5 01/12/2021   Lab Results  Component Value Date   HGBA1C 5.8 (H) 01/12/2021   HGBA1C 5.7 (H) 07/14/2020   HGBA1C 5.7 (H) 01/09/2020   HGBA1C 5.7 (H) 07/09/2019   HGBA1C 5.8 (H) 12/05/2018   Lab Results  Component Value Date   INSULIN 19.3 01/29/2021   INSULIN 22.0 01/09/2020   Lab Results  Component Value Date   TSH 2.560 01/29/2021   Lab Results  Component Value Date   CHOL 131 07/14/2020   HDL 55 07/14/2020   LDLCALC 58 07/14/2020   TRIG 98 07/14/2020   CHOLHDL 2.4 07/14/2020   Lab Results  Component Value Date   WBC 9.5 01/12/2021   HGB 12.8 01/12/2021   HCT 39.0 01/12/2021   MCV 80 01/12/2021   PLT 263 01/12/2021   Attestation Statements:   [name] by clinician on day of visit: allergies, medications, problem list, medical history, surgical  history, family history, social history, and previous encounter notes.  I, Water quality scientist, CMA, am acting as Location manager for [employer], DO  I have [name] the above documentation for accuracy and completeness, and I agree with the above. Jearld Lesch, DO

## 2021-02-09 NOTE — Progress Notes (Signed)
Primary Physician/Referring:  Glendale Chard, MD  Patient ID: Kristine [name], female    DOB: [birth year], 70 y.o.   MRN: [id number]  Chief Complaint  Patient presents with  . Coronary Artery Disease  . Follow-up  . Results   HPI:    Kristine [name]  is a 70 y.o. 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 Christinia Gully, morbid obesity, severe obstructive sleep apnea on CPAP since August 2018 and follows Dr. Roddie Mc, hypertension, mild hyperlipidemia,  prediabetes, positive RNP antibody and also elevated ANA, had non-ischemic stress test in May 2019. Due to worsening dyspnea on exertion and abnormal EKG with marked T wave abnormality in anterolateral leads, underwent coronary CTA & repeat echocardiogram in 2021 revealing no obstructive CAD normal LVEF.   Patient was seen by pulmonary team in 07/2020 at which time her PFTs and CT of the chest were both stable without evidence of active sarcoidosis, it was suspected dyspnea was related to deconditioning. She has no upcoming appointment with pulmonolgy.   Patient presents for 4 week follow up of dyspnea and results of cardiac testing. Echocardiogram is unchanged compared to prior with normal LVEF and severe calcification of mitral valve annulus. He BNP was also normal.  Patient reports her symptoms of dyspnea on exertion significantly improved since last visit.  She has only had one episode, which resolved with use of her albuterol inhaler.  Denies chest pain, palpitations, dizziness, lightheadedness, syncope, near syncope.  Denies leg swelling, orthopnea, PND.  Patient states she has been able to clean out her house and do her regular activities, although she does not have a formal exercise routine, without dyspnea on exertion.  Patient reports she is considering rejoining weight watchers in order to lose weight, as she had previously established with [facility] for weight management, however would like more  flexibility in the weight management program.   Past Medical History:  Diagnosis Date  . Allergy   . Arthritis   . Back pain   . Glaucoma    ROD  . Hypertension   . Joint pain   . Kidney problem   . Knee pain   . Obesity   . Sarcoidosis   . Sleep apnea   . SOB (shortness of breath)    Past Surgical History:  Procedure Laterality Date  . ABDOMINAL HYSTERECTOMY    . BUNIONECTOMY     left  . CHOLECYSTECTOMY    . KNEE SURGERY     right  . LEFT HEART CATHETERIZATION WITH CORONARY ANGIOGRAM N/A 09/12/2014   Procedure: LEFT HEART CATHETERIZATION WITH CORONARY ANGIOGRAM;  Surgeon: Troy Sine, MD;  Location: [facility] CATH LAB;  Service: Cardiovascular;  Laterality: N/A;   Family History  Problem Relation Age of Onset  . Breast cancer Mother   . Lung cancer Mother   . Lung cancer Father        smoked  . Heart disease Maternal Grandmother   . Hypertension Brother   . Liver cancer Brother   . Pneumonia Brother    Social History   Tobacco Use  . Smoking status: Never Smoker  . Smokeless tobacco: Never Used  Substance Use Topics  . Alcohol use: Yes    Comment: occ  Marital Status: Married  ROS  Review of Systems  Constitutional: Negative for malaise/fatigue and weight gain.  Cardiovascular: Positive for dyspnea on exertion (1 episode resolved with albuterol use). Negative for chest pain, claudication, leg swelling, near-syncope, orthopnea, palpitations,  paroxysmal nocturnal dyspnea and syncope.  Hematologic/Lymphatic: Does not bruise/bleed easily.  Gastrointestinal: Negative for melena.  Neurological: Negative for dizziness and weakness.   Objective  Blood pressure 121/72, pulse 70, temperature 97.7 F (36.5 C), height _0  (1.676 m), weight 249 lb (112.9 kg), SpO2 97 %.  Vitals with BMI 02/10/2021 01/29/2021 01/13/2021  Height _1  _2  _3   Weight 249 lbs 233 lbs 236 lbs  BMI 40.21 98.26 41.58  Systolic 309 407 680  Diastolic 72 72 75  Pulse 70 71 73      Physical Exam Vitals reviewed.  HENT:     Head: Normocephalic and atraumatic.  Neck:     Thyroid: No thyromegaly.  Cardiovascular:     Rate and Rhythm: Normal rate and regular rhythm.     Pulses: Intact distal pulses.     Heart sounds: Normal heart sounds, S1 normal and S2 normal. No murmur heard. No gallop.      Comments: No leg edema, no JVD. Pulmonary:     Effort: Pulmonary effort is normal. No respiratory distress.     Breath sounds: Normal breath sounds. No wheezing, rhonchi or rales.  Abdominal:     Comments: Obese and pannus is present.  Musculoskeletal:     Cervical back: Neck supple.     Right lower leg: No edema.     Left lower leg: No edema.  Skin:    General: Skin is warm and dry.  Neurological:     General: No focal deficit present.     Mental Status: She is alert and oriented to person, place, and time.    Laboratory examination:   Recent Labs    07/14/20 1112 01/12/21 1057  NA 140 141  K 4.4 4.9  CL 104 106  CO2 22 22  GLUCOSE 108* 108*  BUN 17 22  CREATININE 0.90 1.04*  CALCIUM 9.8 9.8  GFRNONAA 66  --   GFRAA 77  --    CrCl cannot be calculated (Patient's most recent lab result is older than the maximum 21 days allowed.).  CMP Latest Ref Rng & Units 01/12/2021 07/14/2020 01/23/2020  Glucose 65 - 99 mg/dL 108(H) 108(H) 109(H)  BUN 8 - 27 mg/dL _4 Creatinine 0.57 - 1.00 mg/dL 1.04(H) 0.90 0.92  Sodium 134 - 144 mmol/L 141 140 144  Potassium 3.5 - 5.2 mmol/L 4.9 4.4 4.3  Chloride 96 - 106 mmol/L 106 104 108(H)  CO2 20 - 29 mmol/L _5 Calcium 8.7 - 10.3 mg/dL 9.8 9.8 9.2  Total Protein 6.0 - 8.5 g/dL 7.2 6.9 -  Total Bilirubin 0.0 - 1.2 mg/dL 0.5 0.2 -  Alkaline Phos 44 - 121 IU/L 62 64 -  AST 0 - 40 IU/L 11 14 -  ALT 0 - 32 IU/L 15 14 -   CBC Latest Ref Rng & Units 01/12/2021 07/14/2020 05/16/2019  WBC 3.4 - 10.8 x10E3/uL 9.5 6.3 8.6  Hemoglobin 11.1 - 15.9 g/dL 12.8 11.8 13.1  Hematocrit 34.0 - 46.6 % 39.0 35.4 39.7  Platelets 150  - 450 x10E3/uL 263 244 267   Lipid Panel     Component Value Date/Time   CHOL 131 07/14/2020 1109   TRIG 98 07/14/2020 1109   HDL 55 07/14/2020 1109   CHOLHDL 2.4 07/14/2020 1109   CHOLHDL 4.1 04/05/2013 0858   VLDL 15 04/05/2013 0858   LDLCALC 58 07/14/2020 1109   HEMOGLOBIN A1C Lab Results  Component Value Date  HGBA1C 5.8 (H) 01/12/2021   MPG 117 (H) 07/11/2014   TSH Recent Labs    01/29/21 1013  TSH 2.560   BNP    Component Value Date/Time   BNP 17.4 01/14/2021 1616    ProBNP    Component Value Date/Time   PROBNP 34.0 07/30/2016 1623     External labs  03/14/2018: Cholesterol 175, triglycerides 78, HDL 47, LDL 112. Sedimentation rate 16.  12/27/2017: Hemoglobin A1c 6.2%. Creatinine 0.92, EGFR 76/66, potassium 4.0, BMP normal. Vitamin B12 308.  05/02/2017: Anti--DNA less than 1. RNP antibodies 1.1 elevated. Sjorgens negative. ANA direct positive. TSH 2.6. Vitamin D 39.  03/07/2017: Cholesterol 183, triglycerides 66, HDL 56, LDL 114.  Medications and allergies  No Known Allergies   Current Outpatient Medications  Medication Instructions  . albuterol (VENTOLIN HFA) 108 (90 Base) MCG/ACT inhaler INHALE TWO PUFFS BY MOUTH EVERY 6 HOURS AS NEEDED FOR WHEEZING OR SHORTNESS OF BREATH  . aspirin EC 81 mg, Oral, Daily  . atorvastatin (LIPITOR) 10 MG tablet TAKE ONE TABLET BY MOUTH DAILY  . Brinzolamide-Brimonidine 1-0.2 % SUSP 1 drop, Ophthalmic, 2 times daily  . CALCIUM PO Oral  . Magnesium 400 MG CAPS Oral  . metoprolol succinate (TOPROL-XL) 25 MG 24 hr tablet TAKE ONE TABLET BY MOUTH DAILY  . telmisartan (MICARDIS) 80 MG tablet TAKE ONE TABLET BY MOUTH DAILY  . Vitamin D3 5,000 Units, Oral, Daily    Radiology:   Chest x-ray PA and lateral view 12/08/2017: No acute pneumonia nor CHF. Bilateral hilar lymphadenopathy and mild chronic interstitial prominence consistent with known sarcoidosis.  Cardiac Studies:   Coronary angiogram 09/12/2014: Normal LV  systolic function at 56%, 40% ostial stenosis of the LAD, otherwise normal coronary arteries.  Exercise sestamibi stress test 03/20/2018: 1. The patient performed treadmill exercise using a Bruce protocol, completing 4:49 minutes. The patient completed an estimated workload of 6.81 METS, reaching 105% of the maximum predicted heart rate. No stress symptoms reported. Peak BP 220/98 mmHg. No ischemic changes seen on stress electrocardiogram. 2. The overall quality of the study is good. There is no evidence of abnormal lung activity. Stress and rest SPECT images demonstrate homogeneous tracer distribution throughout the myocardium. Gated SPECT imaging reveals normal myocardial thickening and wall motion. The left ventricular ejection fraction was normal (56%). 3. Low risk study.  Echocardiogram 12/07/2019:  Left ventricle cavity is normal in size and wall thickness. Normal LV systolic function with EF 56%. Normal global wall motion. Unable to evaluate diastolic function due to severity of mitral annular calcification. Calculated EF 56%.  Left atrial cavity is severely dilated.  Severe calcification of the mitral valve annulus. Mild (Grade I) mitral regurgitation.  Mild tricuspid regurgitation. Estimated pulmonary artery systolic pressure is 30 mmHg.  No significant change compared to previous study on 04/04/2018.   Coronary CTA 01/25/2020:  1. Coronary calcium score of 201. This was 89th percentile for age and sex matched control. 2. Normal coronary origin with right dominance. 3.  Mild (25-49%) calcified plaque in a small D1. 4.  Moderate (50-69%) mixed plaque in the mid LCX and OM2. 5.  Will send study for FFRct. 6.  Moderate mitral annular calcification.  FFR findings consistent with nonobstructive coronary artery disease.  Extracardiac findings: Extensive mediastinal and bilateral hilar lymphadenopathy, compatible with reported clinical history of sarcoidosis. A few scattered tiny 2-4 mm  pulmonary nodules noted in the lungs bilaterally, nonspecific, but statistically likely benign in this patient with history of sarcoidosis.   PCV  ECHOCARDIOGRAM COMPLETE 55/73/2202 Normal LV systolic function with visual EF 55-60%. Left ventricle cavity is normal in size. The left ventricle is normal thickness, septal bulge. Normal global wall motion. Unable to evaluate diastolic function due to severity of mitral annular  calcification. Elevated LAP. Severe calcification of the mitral valve annulus. Native mitral valve with trace regurgitation. No evidence of mitral valve stenosis. Compared to prior study dated 12/07/2019 no significant change.  EKG   EKG 01/13/2021: Sinus rhythm rate of 76 bpm.  Left axis, left anterior fascicular block.  Poor progression, cannot exclude anteroseptal infarct old.  LVH with secondary ST-T abnormalities, cannot exclude anterolateral ischemia.  EKG 12/26/2019: Normal sinus rhythm with rate of 67 bpm, left axis deviation, left anterior fascicular block.  IVCD, incomplete left bundle branch block.  T wave normality, cannot exclude anterolateral ischemia.   Compared to EKG 03/13/2018, anterolateral T inversion new.  Assessment     ICD-10-CM   1. Dyspnea on exertion  R06.00   2. Coronary artery disease involving native coronary artery of native heart with other form of angina pectoris ([place])  I25.118   3. WHO group 3 pulmonary arterial hypertension (HCC)  I27.23      No orders of the defined types were placed in this encounter.   There are no discontinued medications.   Recommendations:   MATILDE POTTENGER  is a 70 y.o. 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 Christinia Gully, morbid obesity, severe obstructive sleep apnea on CPAP since August 2018 and follows Dr. Roddie Mc, hypertension, mild hyperlipidemia,  prediabetes, positive RNP antibody and also elevated ANA, had non-ischemic stress test in May 2019. Due to worsening dyspnea on exertion  and abnormal EKG with marked T wave abnormality in anterolateral leads, underwent coronary CTA & repeat echocardiogram in 2021 revealing no obstructive CAD normal LVEF.   Patient presents for 4 week follow up of dyspnea and results of cardiac testing.  Reviewed and discussed results of echocardiogram with patient, details above. Echocardiogram is unchanged compared to prior with normal LVEF and severe calcification of mitral valve annulus. He BNP was also normal.  Patient's dyspnea on exertion has improved since last visit, she had a single episode which resolved with use of albuterol inhaler.  In view of normal BNP, unchanged echocardiogram, and recent CTA consistent with nonobstructive coronary artery disease, suspicion for ischemia remains low.  Suspect dyspnea on exertion is related to deconditioning.  Had a long discussion with patient regarding diet and lifestyle modifications including weight loss and increasing physical activity in order to improve dyspnea on exertion.  Did discuss with patient option of undergoing further ischemic evaluation, however shared decision was to hold off as symptoms have improved and she has recent coronary CTA with FFR findings consistent with nonobstructive CAD.  Patient will attempt to make diet and lifestyle modifications, as well as lose weight and reevaluate symptoms at next office visit.  She will notify our office if she experiences worsening symptoms.  Notably patient does have underlying who group 3 pulmonary hypertension, however repeat echocardiogram did not show worsening pulmonary hypertension.  Follow-up in 6 months, sooner if needed, for hypertension, hyperlipidemia, CAD.   Alethia Berthold, PA-C 02/10/2021, 11:25 AM Office: [phone]

## 2021-02-10 ENCOUNTER — Encounter: Payer: Self-pay | Admitting: Student

## 2021-02-10 ENCOUNTER — Ambulatory Visit: Payer: BC Managed Care – PPO | Admitting: Student

## 2021-02-10 ENCOUNTER — Other Ambulatory Visit: Payer: Self-pay

## 2021-02-10 VITALS — BP 121/72 | HR 70 | Temp 97.7°F | Ht 66.0 in | Wt 249.0 lb

## 2021-02-10 DIAGNOSIS — R0600 Dyspnea, unspecified: Secondary | ICD-10-CM

## 2021-02-10 DIAGNOSIS — I2723 Pulmonary hypertension due to lung diseases and hypoxia: Secondary | ICD-10-CM

## 2021-02-10 DIAGNOSIS — I25118 Atherosclerotic heart disease of native coronary artery with other forms of angina pectoris: Secondary | ICD-10-CM

## 2021-02-10 DIAGNOSIS — R0609 Other forms of dyspnea: Secondary | ICD-10-CM

## 2021-02-12 ENCOUNTER — Ambulatory Visit (INDEPENDENT_AMBULATORY_CARE_PROVIDER_SITE_OTHER): Payer: BC Managed Care – PPO | Admitting: Bariatrics

## 2021-02-17 ENCOUNTER — Institutional Professional Consult (permissible substitution): Payer: BC Managed Care – PPO | Admitting: Neurology

## 2021-02-24 ENCOUNTER — Other Ambulatory Visit: Payer: Self-pay

## 2021-02-24 ENCOUNTER — Encounter: Payer: Self-pay | Admitting: Internal Medicine

## 2021-02-24 ENCOUNTER — Ambulatory Visit: Payer: BC Managed Care – PPO | Admitting: Internal Medicine

## 2021-02-24 VITALS — BP 116/78 | HR 80 | Temp 97.8°F | Ht 66.0 in | Wt 236.6 lb

## 2021-02-24 DIAGNOSIS — J301 Allergic rhinitis due to pollen: Secondary | ICD-10-CM | POA: Diagnosis not present

## 2021-02-24 DIAGNOSIS — R059 Cough, unspecified: Secondary | ICD-10-CM | POA: Diagnosis not present

## 2021-02-24 DIAGNOSIS — N3281 Overactive bladder: Secondary | ICD-10-CM | POA: Diagnosis not present

## 2021-02-24 DIAGNOSIS — J069 Acute upper respiratory infection, unspecified: Secondary | ICD-10-CM

## 2021-02-24 MED ORDER — MONTELUKAST SODIUM 10 MG PO TABS: 10.0000 mg | ORAL_TABLET | Freq: Every day | ORAL | 2 refills | Status: DC

## 2021-02-24 MED ORDER — MIRABEGRON ER 50 MG PO TB24: 50.0000 mg | ORAL_TABLET | Freq: Every day | ORAL | 3 refills | Status: DC

## 2021-02-24 MED ORDER — BENZONATATE 100 MG PO CAPS: 100.0000 mg | ORAL_CAPSULE | Freq: Three times a day (TID) | ORAL | 1 refills | Status: DC | PRN

## 2021-02-24 MED ORDER — FLUTICASONE PROPIONATE 50 MCG/ACT NA SUSP: 1.0000 | Freq: Every day | NASAL | 2 refills | Status: DC

## 2021-02-24 NOTE — Progress Notes (Signed)
I,Katawbba Wiggins,acting as a Education administrator for [name], MD.,have documented all relevant documentation on the behalf of [name], MD,as directed by  [name], MD while in the presence of [name], MD.  This visit occurred during the SARS-CoV-2 public health emergency.  Safety protocols were in place, including screening questions prior to the visit, additional usage of staff PPE, and extensive cleaning of exam room while observing appropriate contact time as indicated for disinfecting solutions.  Subjective:     Patient ID: Kristine [name] , female    DOB: August 05, 1951 , 70 y.o.   MRN: [id number]   Chief Complaint  Patient presents with  . Cough    HPI  The patient is here today for evaluation of a cough. She describes it as a dry cough. Denies fever/chills. She has been feeling tired. Thinks it is likely due to her allergies.   Cough This is a recurrent problem. The current episode started in the past 7 days. The problem has been unchanged. The cough is non-productive. Associated symptoms include postnasal drip and rhinorrhea. Pertinent negatives include no chills or shortness of breath. The symptoms are aggravated by lying down and pollens. Her past medical history is significant for environmental allergies.     Past Medical History:  Diagnosis Date  . Allergy   . Arthritis   . Back pain   . Glaucoma    ROD  . Hypertension   . Joint pain   . Kidney problem   . Knee pain   . Obesity   . Sarcoidosis   . Sleep apnea   . SOB (shortness of breath)      Family History  Problem Relation Age of Onset  . Breast cancer Mother   . Lung cancer Mother   . Lung cancer Father        smoked  . Heart disease Maternal Grandmother   . Hypertension Brother   . Liver cancer Brother   . Pneumonia Brother      Current Outpatient Medications:  .  albuterol (VENTOLIN HFA) 108 (90 Base) MCG/ACT inhaler, INHALE TWO PUFFS BY MOUTH EVERY 6 HOURS AS NEEDED FOR  WHEEZING OR SHORTNESS OF BREATH, Disp: 18 g, Rfl: 1 .  aspirin EC 81 MG tablet, Take 1 tablet (81 mg total) by mouth daily., Disp: , Rfl:  .  atorvastatin (LIPITOR) 10 MG tablet, TAKE ONE TABLET BY MOUTH DAILY, Disp: 90 tablet, Rfl: 1 .  benzonatate (TESSALON PERLES) 100 MG capsule, Take 1 capsule (100 mg total) by mouth 3 (three) times daily as needed for cough., Disp: 30 capsule, Rfl: 1 .  Brinzolamide-Brimonidine 1-0.2 % SUSP, Apply 1 drop to eye 2 (two) times daily., Disp: , Rfl:  .  CALCIUM PO, Take by mouth., Disp: , Rfl:  .  Cholecalciferol (VITAMIN D3) 5000 UNITS CAPS, Take 5,000 Units by mouth daily., Disp: , Rfl:  .  fluticasone (FLONASE) 50 MCG/ACT nasal spray, Place 1 spray into both nostrils daily., Disp: 16 g, Rfl: 2 .  Magnesium 400 MG CAPS, Take by mouth., Disp: , Rfl:  .  metoprolol succinate (TOPROL-XL) 25 MG 24 hr tablet, TAKE ONE TABLET BY MOUTH DAILY, Disp: 90 tablet, Rfl: 1 .  montelukast (SINGULAIR) 10 MG tablet, Take 1 tablet (10 mg total) by mouth daily., Disp: 30 tablet, Rfl: 2 .  telmisartan (MICARDIS) 80 MG tablet, TAKE ONE TABLET BY MOUTH DAILY, Disp: 90 tablet, Rfl: 1 .  mirabegron ER (MYRBETRIQ) 50 MG TB24  tablet, Take 1 tablet (50 mg total) by mouth daily., Disp: 30 tablet, Rfl: 3  Current Facility-Administered Medications:  .  tetanus & diphtheria toxoids (adult) (TENIVAC) injection 0.5 mL, 0.5 mL, Intramuscular, Once, Glendale Chard, MD   No Known Allergies   Review of Systems  Constitutional: Negative.  Negative for chills.  HENT: Positive for postnasal drip and rhinorrhea.   Respiratory: Positive for cough. Negative for shortness of breath.   Cardiovascular: Negative.   Genitourinary: Positive for frequency.  Allergic/Immunologic: Positive for environmental allergies.  Psychiatric/Behavioral: Negative.   All other systems reviewed and are negative.    Today's Vitals   02/24/21 1403  BP: 116/78  Pulse: 80  Temp: 97.8 F (36.6 C)  TempSrc: Oral   SpO2: 99%  Weight: 236 lb 9.6 oz (107.3 kg)  Height: 5' 6" (1.676 m)   Body mass index is 38.19 kg/m.  Wt Readings from Last 3 Encounters:  02/24/21 236 lb 9.6 oz (107.3 kg)  02/10/21 249 lb (112.9 kg)  01/29/21 233 lb (105.7 kg)   Objective:  Physical Exam Vitals and nursing note reviewed.  Constitutional:      Appearance: Normal appearance.  HENT:     Head: Normocephalic and atraumatic.     Right Ear: Tympanic membrane, ear canal and external ear normal.     Left Ear: Tympanic membrane, ear canal and external ear normal.     Nose:     Comments: Masked     Mouth/Throat:     Comments: Masked  Cardiovascular:     Rate and Rhythm: Normal rate and regular rhythm.     Heart sounds: Normal heart sounds.  Pulmonary:     Effort: Pulmonary effort is normal.     Breath sounds: Normal breath sounds.  Skin:    General: Skin is warm.  Neurological:     General: No focal deficit present.     Mental Status: She is alert.  Psychiatric:        Mood and Affect: Mood normal.        Behavior: Behavior normal.         Assessment And Plan:     1. Cough Comments: She agrees to COVID testing. She is encouraged to go to ER should her sx persist/worsen. I do not think ABx are indicated at this time.  - Novel Coronavirus, NAA ([facility])  2. Seasonal allergic rhinitis due to pollen Comments: I will send rx montelukast 87m w/ daily dosing and fluticasone NS.  I will also send rx tessalon perles to use prn.   3. OAB (overactive bladder) Comments: I will send rx Myrbetriq [id number] daily to her pharmacy.   Patient was given opportunity to ask questions. Patient verbalized understanding of the plan and was able to repeat key elements of the plan. All questions were answered to their satisfaction.   I, RoMaximino GreenlandMD, have reviewed all documentation for this visit. The documentation on 03/11/21 for the exam, diagnosis, procedures, and orders are all accurate and complete.   IF YOU  HAVE BEEN REFERRED TO A SPECIALIST, IT MAY TAKE 1-2 WEEKS TO SCHEDULE/PROCESS THE REFERRAL. IF YOU HAVE NOT HEARD FROM US/SPECIALIST IN TWO WEEKS, PLEASE GIVE [place] CALL AT [phone].   THE PATIENT IS ENCOURAGED TO PRACTICE SOCIAL DISTANCING DUE TO THE COVID-19 PANDEMIC.

## 2021-02-24 NOTE — Patient Instructions (Signed)

## 2021-02-25 ENCOUNTER — Other Ambulatory Visit: Payer: Self-pay

## 2021-02-25 LAB — SARS-COV-2, NAA 2 DAY TAT

## 2021-02-25 LAB — NOVEL CORONAVIRUS, NAA: SARS-CoV-2, NAA: NOT DETECTED

## 2021-03-03 ENCOUNTER — Telehealth: Payer: Self-pay

## 2021-03-03 ENCOUNTER — Telehealth: Payer: Self-pay | Admitting: Neurology

## 2021-03-03 ENCOUNTER — Encounter: Payer: Self-pay | Admitting: Neurology

## 2021-03-03 ENCOUNTER — Institutional Professional Consult (permissible substitution): Payer: BC Managed Care – PPO | Admitting: Neurology

## 2021-03-03 NOTE — Telephone Encounter (Signed)
The pt was asked if she is willing to try Lisbeth Ply because her insurance prefers that over the myrebetriq.  The pt said she wants to see if she can give it some time because she hasn't taken the medication and hasn't had any issue.

## 2021-03-03 NOTE — Telephone Encounter (Signed)
Pt was a no show to the sleep consult apt that was scheduled for today

## 2021-05-13 ENCOUNTER — Ambulatory Visit: Payer: BC Managed Care – PPO | Admitting: Internal Medicine

## 2021-05-13 ENCOUNTER — Encounter: Payer: Self-pay | Admitting: Internal Medicine

## 2021-05-13 ENCOUNTER — Other Ambulatory Visit: Payer: Self-pay

## 2021-05-13 VITALS — BP 124/80 | HR 86 | Temp 98.1°F | Ht 66.0 in | Wt 238.8 lb

## 2021-05-13 DIAGNOSIS — M898X1 Other specified disorders of bone, shoulder: Secondary | ICD-10-CM | POA: Diagnosis not present

## 2021-05-13 DIAGNOSIS — R7309 Other abnormal glucose: Secondary | ICD-10-CM | POA: Diagnosis not present

## 2021-05-13 DIAGNOSIS — I10 Essential (primary) hypertension: Secondary | ICD-10-CM | POA: Diagnosis not present

## 2021-05-13 DIAGNOSIS — Z6838 Body mass index (BMI) 38.0-38.9, adult: Secondary | ICD-10-CM

## 2021-05-13 NOTE — Patient Instructions (Signed)
Cooking With Less Salt Cooking with less salt is one way to reduce the amount of sodium you get from food. Sodium is one of the elements that make up salt. It is found naturally in foods and is also added to certain foods. Depending on your condition and overall health, your health care provider or dietitian may recommend that you reduce your sodium intake. Most people should have less than 2,300 milligrams (mg) of sodium each day. If you have high blood pressure (hypertension), you may need to limit your sodium to 1,500 mg each day. Follow the tipsbelow to help reduce your sodium intake. What are tips for eating less sodium? Reading food labels  Check the food label before buying or using packaged ingredients. Always check the label for the serving size and sodium content. Look for products with no more than 140 mg of sodium in one serving. Check the % Daily Value column to see what percent of the daily recommended amount of sodium is provided in one serving of the product. Foods with 5% or less in this column are considered low in sodium. Foods with 20% or higher are considered high in sodium. Do not choose foods with salt as one of the first three ingredients on the ingredients list. If salt is one of the first three ingredients, it usually means the item is high in sodium.  Shopping Buy sodium-free or low-sodium products. Look for the following words on food labels: Low-sodium. Sodium-free. Reduced-sodium. No salt added. Unsalted. Always check the sodium content even if foods are labeled as low-sodium or no salt added. Buy fresh foods. Cooking Use herbs, seasonings without salt, and spices as substitutes for salt. Use sodium-free baking soda when baking. Grill, braise, or roast foods to add flavor with less salt. Avoid adding salt to pasta, rice, or hot cereals. Drain and rinse canned vegetables, beans, and meat before use. Avoid adding salt when cooking sweets and desserts. Cook with  low-sodium ingredients. What foods are high in sodium? Vegetables Regular canned vegetables (not low-sodium or reduced-sodium). Sauerkraut, pickled vegetables, and relishes. Olives. [place] fries. Onion rings. Regular canned tomato sauce and paste. Regular tomato and vegetable juice. Frozenvegetables in sauces. Grains Instant hot cereals. Bread stuffing, pancake, and biscuit mixes. Croutons. Seasoned rice or pasta mixes. Noodle soup cups. Boxed or frozen macaroni and cheese. Regular salted crackers. Self-rising flour. Rolls. Bagels. Flourtortillas and wraps. Meats and other proteins Meat or fish that is salted, canned, smoked, cured, spiced, or pickled. This includes bacon, ham, sausages, hot dogs, corned beef, chipped beef, meat loaves, salt pork, jerky, pickled herring, anchovies, regular canned tuna, andsardines. Salted nuts. Dairy Processed cheese and cheese spreads. Cheese curds. Blue cheese. Feta cheese.String cheese. Regular cottage cheese. Buttermilk. Canned milk. The items listed above may not be a complete list of foods high in sodium. Actual amounts of sodium may be different depending on processing. Contact a dietitian for more information. What foods are low in sodium? Fruits Fresh, frozen, or canned fruit with no sauce added. Fruit juice. Vegetables Fresh or frozen vegetables with no sauce added. "No salt added" canned vegetables. "No salt added" tomato sauce and paste. Low-sodium orreduced-sodium tomato and vegetable juice. Grains Noodles, pasta, quinoa, rice. Shredded or puffed wheat or puffed rice. Regular or quick oats (not instant). Low-sodium crackers. Low-sodium bread. Whole-grainbread and whole-grain pasta. Unsalted popcorn. Meats and other proteins Fresh or frozen whole meats, poultry (not injected with sodium), and fish with no sauce added. Unsalted nuts. Dried peas, beans, and  lentils without added salt. Unsalted canned beans. Eggs. Unsalted nut butters. Low-sodium canned  tunaor chicken. Dairy Milk. Soy milk. Yogurt. Low-sodium cheeses, such as Swiss, Monte[place] Jack, [place], and [employer]. Sherbet or ice cream (keep to  cup per serving).Cream cheese. Fats and oils Unsalted butter or margarine. Other foods Homemade pudding. Sodium-free baking soda and baking powder. Herbs and spices.Low-sodium seasoning mixes. Beverages Coffee and tea. Carbonated beverages. The items listed above may not be a complete list of foods low in sodium. Actual amounts of sodium may be different depending on processing. Contact a dietitian for more information. What are some salt alternatives when cooking? The following are herbs, seasonings, and spices that can be used instead of salt to flavor your food. Herbs should be fresh or dried. Do not choose packaged mixes. Next to the name of the herb, spice, or seasoning aresome examples of foods you can pair it with. Herbs [place] leaves - Soups, meat and vegetable dishes, and spaghetti sauce. Basil - [employer], soups, pasta, and fish dishes. Cilantro - Meat, poultry, and vegetable dishes. Chili powder - Marinades and Mexican dishes. Chives - Salad dressings and potato dishes. Cumin - Mexican dishes, couscous, and meat dishes. Dill - Fish dishes, sauces, and salads. Fennel - Meat and vegetable dishes, breads, and cookies. Garlic (do not use garlic salt) - [place] dishes, meat dishes, salad dressings, and sauces. Marjoram - Soups, potato dishes, and meat dishes. Oregano - Pizza and spaghetti sauce. Parsley - Salads, soups, pasta, and meat dishes. Rosemary - [place] dishes, salad dressings, soups, and red meats. Saffron - Fish dishes, pasta, and some poultry dishes. Sage - Stuffings and sauces. Tarragon - Fish and [employer]. Thyme - Stuffing, meat, and fish dishes. Seasonings Lemon juice - Fish dishes, poultry dishes, vegetables, and salads. Vinegar - Salad dressings, vegetables, and fish dishes. Spices Cinnamon - Sweet  dishes, such as cakes, cookies, and puddings. Cloves - Gingerbread, puddings, and marinades for meats. Curry - Vegetable dishes, fish and poultry dishes, and stir-fry dishes. Ginger - Vegetable dishes, fish dishes, and stir-fry dishes. Nutmeg - Pasta, vegetables, poultry, fish dishes, and custard. Summary Cooking with less salt is one way to reduce the amount of sodium that you get from food. Buy sodium-free or low-sodium products. Check the food label before using or buying packaged ingredients. Use herbs, seasonings without salt, and spices as substitutes for salt in foods. This information is not intended to replace advice given to you by your health care provider. Make sure you discuss any questions you have with your healthcare provider. Document Revised: 10/24/2019 Document Reviewed: 10/24/2019 Elsevier Patient Education  [place].

## 2021-05-13 NOTE — Progress Notes (Signed)
I,Katawbba Wiggins,acting as a Education administrator for [name], MD.,have documented all relevant documentation on the behalf of [name], MD,as directed by  [name], MD while in the presence of [name], MD.  This visit occurred during the SARS-CoV-2 public health emergency.  Safety protocols were in place, including screening questions prior to the visit, additional usage of staff PPE, and extensive cleaning of exam room while observing appropriate contact time as indicated for disinfecting solutions.  Subjective:     Patient ID: Kristine [name] , female    DOB: [birth year] , 70 y.o.   MRN: [id number]   Chief Complaint  Patient presents with   Hypertension    HPI  She is here today for BP check. She reports compliance with meds. She denies headaches, chest pain and shortness of breath.   Hypertension This is a chronic problem. The current episode started more than 1 year ago. The problem has been gradually improving since onset. The problem is controlled. Pertinent negatives include no blurred vision, chest pain, palpitations or shortness of breath. Risk factors for coronary artery disease include obesity, post-menopausal state and sedentary lifestyle.    Past Medical History:  Diagnosis Date   Allergy    Arthritis    Back pain    Glaucoma    ROD   Hypertension    Joint pain    Kidney problem    Knee pain    Obesity    Sarcoidosis    Sleep apnea    SOB (shortness of breath)      Family History  Problem Relation Age of Onset   Breast cancer Mother    Lung cancer Mother    Lung cancer Father        smoked   Heart disease Maternal Grandmother    Hypertension Brother    Liver cancer Brother    Pneumonia Brother      Current Outpatient Medications:    albuterol (VENTOLIN HFA) 108 (90 Base) MCG/ACT inhaler, INHALE TWO PUFFS BY MOUTH EVERY 6 HOURS AS NEEDED FOR WHEEZING OR SHORTNESS OF BREATH, Disp: 18 g, Rfl: 1   aspirin EC 81 MG tablet, Take 1 tablet  (81 mg total) by mouth daily., Disp: , Rfl:    atorvastatin (LIPITOR) 10 MG tablet, TAKE ONE TABLET BY MOUTH DAILY, Disp: 90 tablet, Rfl: 1   benzonatate (TESSALON PERLES) 100 MG capsule, Take 1 capsule (100 mg total) by mouth 3 (three) times daily as needed for cough., Disp: 30 capsule, Rfl: 1   Brinzolamide-Brimonidine 1-0.2 % SUSP, Apply 1 drop to eye 2 (two) times daily., Disp: , Rfl:    CALCIUM PO, Take by mouth., Disp: , Rfl:    Cholecalciferol (VITAMIN D3) 5000 UNITS CAPS, Take 5,000 Units by mouth daily., Disp: , Rfl:    fluticasone (FLONASE) 50 MCG/ACT nasal spray, Place 1 spray into both nostrils daily., Disp: 16 g, Rfl: 2   Magnesium 400 MG CAPS, Take by mouth., Disp: , Rfl:    metoprolol succinate (TOPROL-XL) 25 MG 24 hr tablet, TAKE ONE TABLET BY MOUTH DAILY, Disp: 90 tablet, Rfl: 1   montelukast (SINGULAIR) 10 MG tablet, Take 1 tablet (10 mg total) by mouth daily., Disp: 30 tablet, Rfl: 2   telmisartan (MICARDIS) 80 MG tablet, TAKE ONE TABLET BY MOUTH DAILY, Disp: 90 tablet, Rfl: 1   No Known Allergies   Review of Systems  Constitutional: Negative.   Eyes:  Negative for blurred vision.  Respiratory: Negative.  Negative for shortness of breath.   Cardiovascular: Negative.  Negative for chest pain and palpitations.  Gastrointestinal: Negative.   Musculoskeletal:        She c/o left sided scapular pain. Occurs on occasion. Described as sharp, stabbing pain. Usually occurs when she is sitting at desk working. Most recently bothered her last night.   Psychiatric/Behavioral: Negative.    All other systems reviewed and are negative.   Today's Vitals   05/13/21 1407  BP: 124/80  Pulse: 86  Temp: 98.1 F (36.7 C)  TempSrc: Oral  Weight: 238 lb 12.8 oz (108.3 kg)  Height: _0  (1.676 m)   Body mass index is 38.54 kg/m.  Wt Readings from Last 3 Encounters:  05/13/21 238 lb 12.8 oz (108.3 kg)  02/24/21 236 lb 9.6 oz (107.3 kg)  02/10/21 249 lb (112.9 kg)    BP Readings  from Last 3 Encounters:  05/13/21 124/80  02/24/21 116/78  02/10/21 121/72    Objective:  Physical Exam Vitals and nursing note reviewed.  Constitutional:      Appearance: Normal appearance. She is obese.  HENT:     Head: Normocephalic and atraumatic.     Nose:     Comments: Masked     Mouth/Throat:     Comments: Masked  Cardiovascular:     Rate and Rhythm: Normal rate and regular rhythm.     Heart sounds: Normal heart sounds.  Pulmonary:     Effort: Pulmonary effort is normal.     Breath sounds: Normal breath sounds.  Musculoskeletal:     Comments: There is point tenderness beneath left scapula. No overlying erythema  Skin:    General: Skin is warm.  Neurological:     General: No focal deficit present.     Mental Status: She is alert.  Psychiatric:        Mood and Affect: Mood normal.        Behavior: Behavior normal.        Assessment And Plan:     1. Essential hypertension Comments: Chronic, well controlled. She is encouraged to follow low sodium diet.  I will check renal function today. She will f/u in 4-6 months.  - BMP8+EGFR  2. Scapulalgia Comments: She may benefit from muscle rub on affected area. We also discussed proper posture when working at computer. If persistent, will consider radiographic studies.   3. Other abnormal glucose Comments: She is encouraged to limit her intake of sweetened beverages, including diet drinks.  - Hemoglobin A1c - Insulin, random(561)  4. Class 2 severe obesity due to excess calories with serious comorbidity and body mass index (BMI) of 38.0 to 38.9 in adult [facility]) Comments: She is encouraged to strive for BMI less than 30 to decrease risk. WE discussed the use of Wegovy to address obesity.    Patient was given opportunity to ask questions. Patient verbalized understanding of the plan and was able to repeat key elements of the plan. All questions were answered to their satisfaction.   I, [name], MD, have reviewed  all documentation for this visit. The documentation on 05/19/21 for the exam, diagnosis, procedures, and orders are all accurate and complete.   IF YOU HAVE BEEN REFERRED TO A SPECIALIST, IT MAY TAKE 1-2 WEEKS TO SCHEDULE/PROCESS THE REFERRAL. IF YOU HAVE NOT HEARD FROM US/SPECIALIST IN TWO WEEKS, PLEASE GIVE [place] A CALL AT [phone] X [phone].   THE PATIENT IS ENCOURAGED TO PRACTICE SOCIAL DISTANCING DUE TO THE COVID-19 PANDEMIC.

## 2021-05-14 LAB — BMP8+EGFR
BUN/Creatinine Ratio: 20 (ref 12–28)
BUN: 19 mg/dL (ref 8–27)
CO2: 24 mmol/L (ref 20–29)
Calcium: 9.5 mg/dL (ref 8.7–10.3)
Chloride: 104 mmol/L (ref 96–106)
Creatinine, Ser: 0.94 mg/dL (ref 0.57–1.00)
Glucose: 81 mg/dL (ref 65–99)
Potassium: 4.5 mmol/L (ref 3.5–5.2)
Sodium: 143 mmol/L (ref 134–144)
eGFR: 66 mL/min/{1.73_m2} (ref >59)

## 2021-05-14 LAB — HEMOGLOBIN A1C
Est. average glucose Bld gHb Est-mCnc: 117 mg/dL
Hgb A1c MFr Bld: 5.7 % — ABNORMAL HIGH (ref 4.8–5.6)

## 2021-05-14 LAB — INSULIN, RANDOM: INSULIN: 21.3 u[IU]/mL (ref 2.6–24.9)

## 2021-06-18 LAB — HM COLONOSCOPY

## 2021-06-19 ENCOUNTER — Encounter: Payer: Self-pay | Admitting: Internal Medicine

## 2021-06-23 ENCOUNTER — Other Ambulatory Visit: Payer: Self-pay

## 2021-06-23 MED ORDER — OZEMPIC (0.25 OR 0.5 MG/DOSE) 2 MG/1.5ML ~~LOC~~ SOPN: 0.5000 mg | PEN_INJECTOR | SUBCUTANEOUS | 1 refills | Status: DC

## 2021-06-30 ENCOUNTER — Other Ambulatory Visit: Payer: Self-pay

## 2021-06-30 ENCOUNTER — Encounter: Payer: Self-pay | Admitting: Nurse Practitioner

## 2021-06-30 ENCOUNTER — Telehealth (INDEPENDENT_AMBULATORY_CARE_PROVIDER_SITE_OTHER): Payer: BC Managed Care – PPO | Admitting: Nurse Practitioner

## 2021-06-30 VITALS — BP 138/88 | Temp 98.0°F | Ht 66.0 in

## 2021-06-30 DIAGNOSIS — R059 Cough, unspecified: Secondary | ICD-10-CM

## 2021-06-30 DIAGNOSIS — U071 COVID-19: Secondary | ICD-10-CM | POA: Diagnosis not present

## 2021-06-30 MED ORDER — GUAIFENESIN-DM 100-10 MG/5ML PO SYRP: 5.0000 mL | ORAL_SOLUTION | ORAL | 0 refills | Status: DC | PRN

## 2021-06-30 MED ORDER — NIRMATRELVIR/RITONAVIR (PAXLOVID)TABLET: 3.0000 | ORAL_TABLET | Freq: Two times a day (BID) | ORAL | 0 refills | Status: DC

## 2021-06-30 MED ORDER — NIRMATRELVIR/RITONAVIR (PAXLOVID)TABLET
3.0000 | ORAL_TABLET | Freq: Two times a day (BID) | ORAL | 0 refills | Status: AC
Start: 2021-06-30 — End: 2021-07-05

## 2021-06-30 NOTE — Addendum Note (Signed)
Addended by: Melba Coon on: 06/30/2021 04:54 PM   Modules accepted: Orders

## 2021-06-30 NOTE — Addendum Note (Signed)
Addended by: Melba Coon on: 06/30/2021 04:55 PM   Modules accepted: Orders

## 2021-06-30 NOTE — Patient Instructions (Signed)
10 Things You Can Do to Manage Your COVID-19 Symptoms at Home If you have possible or confirmed COVID-19 Stay home except to get medical care. Monitor your symptoms carefully. If your symptoms get worse, call your healthcare provider immediately. Get rest and stay hydrated. If you have a medical appointment, call the healthcare provider ahead of time and tell them that you have or may have COVID-19. For medical emergencies, call 911 and notify the dispatch personnel that you have or may have COVID-19. Cover your cough and sneezes with a tissue or use the inside of your elbow. Wash your hands often with soap and water for at least 20 seconds or clean your hands with an alcohol-based hand sanitizer that contains at least 60% alcohol. As much as possible, stay in a specific room and away from other people in your home. Also, you should use a separate bathroom, if available. If you need to be around other people in or outside of the home, wear a mask. Avoid sharing personal items with other people in your household, like dishes, towels, and bedding. Clean all surfaces that are touched often, like counters, tabletops, and doorknobs. Use household cleaning sprays or wipes according to the label instructions. [email or web address] 05/30/2020 This information is not intended to replace advice given to you by your health care provider. Make sure you discuss any questions you have with your healthcare provider. Document Revised: 12/19/2020 Document Reviewed: 12/19/2020 Elsevier Patient Education  [place].

## 2021-06-30 NOTE — Progress Notes (Signed)
Virtual Visit via video    This visit type was conducted due to national recommendations for restrictions regarding the COVID-19 Pandemic (e.g. social distancing) in an effort to limit this patient's exposure and mitigate transmission in our community.  Due to her co-morbid illnesses, this patient is at least at moderate risk for complications without adequate follow up.  This format is felt to be most appropriate for this patient at this time.  All issues noted in this document were discussed and addressed.  A limited physical exam was performed with this format.    This visit type was conducted due to national recommendations for restrictions regarding the COVID-19 Pandemic (e.g. social distancing) in an effort to limit this patient's exposure and mitigate transmission in our community.  Patients identity confirmed using two different identifiers.  This format is felt to be most appropriate for this patient at this time.  All issues noted in this document were discussed and addressed.  No physical exam was performed (except for noted visual exam findings with Video Visits).    Date:  06/30/2021   ID:  Kristine [name], DOB March 11, 1951, MRN [id number]  Patient Location:  Home   Provider location:   Office  Chief Complaint:  Pt testes positive on Monday. She had a bad cough on Monday. Sunday she had chills and bodyaches. She is vaccinated and has had both of her booster. She denies SOB, chest pain or leg pain.   History of Present Illness:    Kristine [name] is a 70 y.o. female who presents via video conferencing for a telehealth visit today.    The patient does have symptoms concerning for COVID-19 infection (fever, chills, cough, or new shortness of breath).   The patient is covid on Monday morning. Bad cough on Sunday. Sunday night chills, bodyaches Monday night. She is vaccinated and both booster. She does not have any fever. A little fatigue and body aches.     Past Medical  History:  Diagnosis Date   Allergy    Arthritis    Back pain    Glaucoma    ROD   Hypertension    Joint pain    Kidney problem    Knee pain    Obesity    Sarcoidosis    Sleep apnea    SOB (shortness of breath)    Past Surgical History:  Procedure Laterality Date   ABDOMINAL HYSTERECTOMY     BUNIONECTOMY     left   CHOLECYSTECTOMY     KNEE SURGERY     right   LEFT HEART CATHETERIZATION WITH CORONARY ANGIOGRAM N/A 09/12/2014   Procedure: LEFT HEART CATHETERIZATION WITH CORONARY ANGIOGRAM;  Surgeon: Troy Sine, MD;  Location: [facility] CATH LAB;  Service: Cardiovascular;  Laterality: N/A;     Current Meds  Medication Sig   guaiFENesin-dextromethorphan (ROBITUSSIN DM) 100-10 MG/5ML syrup Take 5 mLs by mouth every 4 (four) hours as needed for cough.   nirmatrelvir/ritonavir EUA (PAXLOVID) TABS Take 3 tablets by mouth 2 (two) times daily for 5 days. Patient GFR is 66. Take nirmatrelvir (150 mg) two tablets twice daily for 5 days and ritonavir (100 mg) one tablet twice daily for 5 days.     Allergies:   Patient has no known allergies.   Social History   Tobacco Use   Smoking status: Never   Smokeless tobacco: Never  Vaping Use   Vaping Use: Never used  Substance Use Topics   Alcohol use: Yes  Comment: occ   Drug use: No     Family Hx: The patient's family history includes Breast cancer in her mother; Heart disease in her maternal grandmother; Hypertension in her brother; Liver cancer in her brother; Lung cancer in her father and mother; Pneumonia in her brother.  ROS:   Please see the history of present illness.    Review of Systems  Constitutional:  Positive for chills and malaise/fatigue. Negative for fever.  HENT:  Negative for congestion.   Respiratory:  Positive for cough. Negative for shortness of breath and wheezing.   Cardiovascular:  Negative for chest pain and leg swelling.  Gastrointestinal:  Negative for abdominal pain, constipation and diarrhea.   Neurological:  Positive for headaches.   All other systems reviewed and are negative.   Labs/Other Tests and Data Reviewed:    Recent Labs: 01/12/2021: ALT 15; Hemoglobin 12.8; Platelets 263 01/14/2021: BNP 17.4 01/29/2021: TSH 2.560 05/13/2021: BUN 19; Creatinine, Ser 0.94; Potassium 4.5; Sodium 143   Recent Lipid Panel Lab Results  Component Value Date/Time   CHOL 131 07/14/2020 11:09 AM   TRIG 98 07/14/2020 11:09 AM   HDL 55 07/14/2020 11:09 AM   CHOLHDL 2.4 07/14/2020 11:09 AM   CHOLHDL 4.1 04/05/2013 08:58 AM   LDLCALC 58 07/14/2020 11:09 AM    Wt Readings from Last 3 Encounters:  05/13/21 238 lb 12.8 oz (108.3 kg)  02/24/21 236 lb 9.6 oz (107.3 kg)  02/10/21 249 lb (112.9 kg)     Exam:    Vital Signs:  BP 138/88 (BP Location: Left Arm, Patient Position: Sitting, Cuff Size: Normal) Comment: pt provided  Temp 98 F (36.7 C) (Oral)   Ht _0  (1.676 m)   BMI 38.54 kg/m     Physical Exam Vitals and nursing note reviewed.  Constitutional:      Appearance: Normal appearance.  HENT:     Head: Normocephalic and atraumatic.  Pulmonary:     Effort: Pulmonary effort is normal.  Skin:    General: Skin is warm and dry.  Neurological:     Mental Status: She is alert and oriented to person, place, and time.  Psychiatric:        Mood and Affect: Affect normal.    ASSESSMENT & PLAN:    1. COVID-19 -Patient tested positive on Monday for COVID.  -She is vaccinated and booster x 2  -Denies SOB, chest pain. -Will prescribe her Paxlovid GFR 66 on 6/29  - nirmatrelvir/ritonavir EUA (PAXLOVID) TABS; Take 3 tablets by mouth 2 (two) times daily for 5 days. Patient GFR is 66. Take nirmatrelvir (150 mg) two tablets twice daily for 5 days and ritonavir (100 mg) one tablet twice daily for 5 days.  Dispense: 30 tablet; Refill: 0 -Discussed drug interaction with patient. Advised patient to Stop her Atorvastatin 12 hours before taking med, during therapy and one week after taking  the med.  -Advised patient to also reach out to her pharmacist for any drug to drug interactions.  Side effects and appropriate use of all the medication(s) were discussed with the patient today. Patient advised to use the medication(s) as directed by their healthcare provider. The patient was encouraged to read, review, and understand all associated package inserts and contact our office with any questions or concerns. The patient accepts the risks of the treatment plan and had an opportunity to ask questions.  -Advised patient to take Vitamin C, D, Zinc. Keep yourself hydrated with a lot of water and rest. Take  Delsym for cough and Mucinex. Take Tylenol or pain reliever every 4-6 hours as needed for pain/fever/body ache. Educated patient if symptoms get worse or if she experiences any SOB or pain in her legs to seek immediate emergency care. Continue to monitor your pulse oxygen. Call us if you have any questions. Quarantine for 5 days if tested positive and no symptoms or 10 days if tested positive and have symptoms. Wear a mask around other people.   2. Cough - guaiFENesin-dextromethorphan (ROBITUSSIN DM) 100-10 MG/5ML syrup; Take 5 mLs by mouth every 4 (four) hours as needed for cough.  Dispense: 118 mL; Refill: 0 -Continue taking Tessalon Pearls as needed.   COVID-19 Education: The signs and symptoms of COVID-19 were discussed with the patient and how to seek care for testing (follow up with PCP or arrange E-visit).  The importance of social distancing was discussed today.  Patient Risk:   After full review of this patients clinical status, I feel that they are at least moderate risk at this time.  Time:   Today, I have spent 15 minutes/ seconds with the patient with telehealth technology discussing above diagnoses.     Medication Adjustments/Labs and Tests Ordered: Current medicines are reviewed at length with the patient today.  Concerns regarding medicines are outlined above.   Tests  Ordered: No orders of the defined types were placed in this encounter.   Medication Changes: Meds ordered this encounter  Medications   guaiFENesin-dextromethorphan (ROBITUSSIN DM) 100-10 MG/5ML syrup    Sig: Take 5 mLs by mouth every 4 (four) hours as needed for cough.    Dispense:  118 mL    Refill:  0   nirmatrelvir/ritonavir EUA (PAXLOVID) TABS    Sig: Take 3 tablets by mouth 2 (two) times daily for 5 days. Patient GFR is 66. Take nirmatrelvir (150 mg) two tablets twice daily for 5 days and ritonavir (100 mg) one tablet twice daily for 5 days.    Dispense:  30 tablet    Refill:  0     Disposition:  Follow up prn  Signed, Bary Castilla, NP

## 2021-07-02 ENCOUNTER — Ambulatory Visit: Payer: BC Managed Care – PPO | Admitting: Internal Medicine

## 2021-07-15 ENCOUNTER — Other Ambulatory Visit: Payer: Self-pay | Admitting: Internal Medicine

## 2021-07-15 DIAGNOSIS — Z1231 Encounter for screening mammogram for malignant neoplasm of breast: Secondary | ICD-10-CM

## 2021-07-23 ENCOUNTER — Encounter: Payer: BC Managed Care – PPO | Admitting: Internal Medicine

## 2021-08-10 ENCOUNTER — Other Ambulatory Visit: Payer: Self-pay

## 2021-08-10 MED ORDER — TELMISARTAN 80 MG PO TABS: 80.0000 mg | ORAL_TABLET | Freq: Every day | ORAL | 1 refills | Status: DC

## 2021-08-11 NOTE — Progress Notes (Deleted)
Primary Physician/Referring:  Glendale Chard, MD  Patient ID: Kristine [name], female    DOB: [birth year], 70 y.o.   MRN: [id number]  No chief complaint on file.  HPI:    Kristine [name]  is a 70 y.o. 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 Christinia Gully, morbid obesity, severe obstructive sleep apnea on CPAP since August 2018 and follows Dr. Roddie Mc, hypertension, mild hyperlipidemia,  prediabetes, positive RNP antibody and also elevated ANA, had non-ischemic stress test in May 2019. Due to worsening dyspnea on exertion and abnormal EKG with marked T wave abnormality in anterolateral leads, underwent coronary CTA & repeat echocardiogram in 2021 revealing no obstructive CAD normal LVEF.   Patient was seen by pulmonary team in 07/2020 at which time her PFTs and CT of the chest were both stable without evidence of active sarcoidosis, it was suspected dyspnea was related to deconditioning.   Patient presents for 69-monthfollow-up of hypertension, hyperlipidemia, CAD.  Last office visit patient was asymptomatic and recent coronary CTA with FFR was consistent with nonobstructive CAD, recommend primary prevention. ***  ***  She has no upcoming appointment with pulmonolgy.   Patient presents for 4 week follow up of dyspnea and results of cardiac testing. Echocardiogram is unchanged compared to prior with normal LVEF and severe calcification of mitral valve annulus. He BNP was also normal.  Patient reports her symptoms of dyspnea on exertion significantly improved since last visit.  She has only had one episode, which resolved with use of her albuterol inhaler.  Denies chest pain, palpitations, dizziness, lightheadedness, syncope, near syncope.  Denies leg swelling, orthopnea, PND.  Patient states she has been able to clean out her house and do her regular activities, although she does not have a formal exercise routine, without dyspnea on exertion.  Patient reports she is  considering rejoining weight watchers in order to lose weight, as she had previously established with [facility] for weight management, however would like more flexibility in the weight management program.   Past Medical History:  Diagnosis Date   Allergy    Arthritis    Back pain    Glaucoma    ROD   Hypertension    Joint pain    Kidney problem    Knee pain    Obesity    Sarcoidosis    Sleep apnea    SOB (shortness of breath)    Past Surgical History:  Procedure Laterality Date   ABDOMINAL HYSTERECTOMY     BUNIONECTOMY     left   CHOLECYSTECTOMY     KNEE SURGERY     right   LEFT HEART CATHETERIZATION WITH CORONARY ANGIOGRAM N/A 09/12/2014   Procedure: LEFT HEART CATHETERIZATION WITH CORONARY ANGIOGRAM;  Surgeon: TTroy Sine MD;  [facility]  Service: Cardiovascular;  Laterality: N/A;   Family History  Problem Relation Age of Onset   Breast cancer Mother    Lung cancer Mother    Lung cancer Father        smoked   Heart disease Maternal Grandmother    Hypertension Brother    Liver cancer Brother    Pneumonia Brother    Social History   Tobacco Use   Smoking status: Never   Smokeless tobacco: Never  Substance Use Topics   Alcohol use: Yes    Comment: occ  Marital Status: Married  ROS  Review of Systems  Constitutional: Negative for malaise/fatigue and weight gain.  Cardiovascular:  Positive for dyspnea on exertion (1 episode resolved with albuterol use). Negative for chest pain, claudication, leg swelling, near-syncope, orthopnea, palpitations, paroxysmal nocturnal dyspnea and syncope.  Hematologic/Lymphatic: Does not bruise/bleed easily.  Gastrointestinal:  Negative for melena.  Neurological:  Negative for dizziness and weakness.  Objective  There were no vitals taken for this visit.  Vitals with BMI 06/30/2021 05/13/2021 02/24/2021  Height _0  _1  _2   Weight (No Data) 238 lbs 13 oz 236 lbs 10 oz  BMI - 80.22 33.61   Systolic 224 497 530  Diastolic 88 80 78  Pulse (No Data) 86 80     Physical Exam Vitals reviewed.  HENT:     Head: Normocephalic and atraumatic.  Neck:     Thyroid: No thyromegaly.  Cardiovascular:     Rate and Rhythm: Normal rate and regular rhythm.     Pulses: Intact distal pulses.     Heart sounds: Normal heart sounds, S1 normal and S2 normal. No murmur heard.   No gallop.     Comments: No leg edema, no JVD. Pulmonary:     Effort: Pulmonary effort is normal. No respiratory distress.     Breath sounds: Normal breath sounds. No wheezing, rhonchi or rales.  Abdominal:     Comments: Obese and pannus is present.  Musculoskeletal:     Cervical back: Neck supple.     Right lower leg: No edema.     Left lower leg: No edema.  Skin:    General: Skin is warm and dry.  Neurological:     General: No focal deficit present.     Mental Status: She is alert and oriented to person, place, and time.   Laboratory examination:   Recent Labs    01/12/21 1057 05/13/21 1452  NA 141 143  K 4.9 4.5  CL 106 104  CO2 22 24  GLUCOSE 108* 81  BUN 22 19  CREATININE 1.04* 0.94  CALCIUM 9.8 9.5    CrCl cannot be calculated (Patient's most recent lab result is older than the maximum 21 days allowed.).  CMP Latest Ref Rng & Units 05/13/2021 01/12/2021 07/14/2020  Glucose 65 - 99 mg/dL 81 108(H) 108(H)  BUN 8 - 27 mg/dL _3 Creatinine 0.57 - 1.00 mg/dL 0.94 1.04(H) 0.90  Sodium 134 - 144 mmol/L 143 141 140  Potassium 3.5 - 5.2 mmol/L 4.5 4.9 4.4  Chloride 96 - 106 mmol/L 104 106 104  CO2 20 - 29 mmol/L _4 Calcium 8.7 - 10.3 mg/dL 9.5 9.8 9.8  Total Protein 6.0 - 8.5 g/dL - 7.2 6.9  Total Bilirubin 0.0 - 1.2 mg/dL - 0.5 0.2  Alkaline Phos 44 - 121 IU/L - 62 64  AST 0 - 40 IU/L - 11 14  ALT 0 - 32 IU/L - 15 14   CBC Latest Ref Rng & Units 01/12/2021 07/14/2020 05/16/2019  WBC 3.4 - 10.8 x10E3/uL 9.5 6.3 8.6  Hemoglobin 11.1 - 15.9 g/dL 12.8 11.8 13.1  Hematocrit 34.0 - 46.6 %  39.0 35.4 39.7  Platelets 150 - 450 x10E3/uL 263 244 267   Lipid Panel     Component Value Date/Time   CHOL 131 07/14/2020 1109   TRIG 98 07/14/2020 1109   HDL 55 07/14/2020 1109   CHOLHDL 2.4 07/14/2020 1109   CHOLHDL 4.1 04/05/2013 0858   VLDL 15 04/05/2013 0858   LDLCALC 58 07/14/2020 1109   HEMOGLOBIN A1C Lab Results  Component Value Date  HGBA1C 5.7 (H) 05/13/2021   MPG 117 (H) 07/11/2014   TSH Recent Labs    01/29/21 1013  TSH 2.560    BNP    Component Value Date/Time   BNP 17.4 01/14/2021 1616    ProBNP    Component Value Date/Time   PROBNP 34.0 07/30/2016 1623     External labs  03/14/2018: Cholesterol 175, triglycerides 78, HDL 47, LDL 112. Sedimentation rate 16.  12/27/2017: Hemoglobin A1c 6.2%. Creatinine 0.92, EGFR 76/66, potassium 4.0, BMP normal. Vitamin B12 308.  05/02/2017: Anti--DNA less than 1. RNP antibodies 1.1 elevated. Sjorgens negative. ANA direct positive. TSH 2.6. Vitamin D 39.  03/07/2017: Cholesterol 183, triglycerides 66, HDL 56, LDL 114.  Allergies  No Known Allergies    Medications Prior to Visit:   Outpatient Medications Prior to Visit  Medication Sig Dispense Refill   albuterol (VENTOLIN HFA) 108 (90 Base) MCG/ACT inhaler INHALE TWO PUFFS BY MOUTH EVERY 6 HOURS AS NEEDED FOR WHEEZING OR SHORTNESS OF BREATH 18 g 1   aspirin EC 81 MG tablet Take 1 tablet (81 mg total) by mouth daily.     atorvastatin (LIPITOR) 10 MG tablet TAKE ONE TABLET BY MOUTH DAILY 90 tablet 1   benzonatate (TESSALON PERLES) 100 MG capsule Take 1 capsule (100 mg total) by mouth 3 (three) times daily as needed for cough. 30 capsule 1   Brinzolamide-Brimonidine 1-0.2 % SUSP Apply 1 drop to eye 2 (two) times daily.     CALCIUM PO Take by mouth.     Cholecalciferol (VITAMIN D3) 5000 UNITS CAPS Take 5,000 Units by mouth daily.     fluticasone (FLONASE) 50 MCG/ACT nasal spray Place 1 spray into both nostrils daily. 16 g 2   guaiFENesin-dextromethorphan  (ROBITUSSIN DM) 100-10 MG/5ML syrup Take 5 mLs by mouth every 4 (four) hours as needed for cough. 118 mL 0   Magnesium 400 MG CAPS Take by mouth.     metoprolol succinate (TOPROL-XL) 25 MG 24 hr tablet TAKE ONE TABLET BY MOUTH DAILY 90 tablet 1   montelukast (SINGULAIR) 10 MG tablet Take 1 tablet (10 mg total) by mouth daily. 30 tablet 2   Semaglutide,0.25 or 0.5MG/DOS, (OZEMPIC, 0.25 OR 0.5 MG/DOSE,) 2 MG/1.5ML SOPN Inject 0.5 mg into the skin once a week. 1.5 mL 1   telmisartan (MICARDIS) 80 MG tablet Take 1 tablet (80 mg total) by mouth daily. 90 tablet 1   No facility-administered medications prior to visit.   Final Medications at End of Visit    No outpatient medications have been marked as taking for the 08/13/21 encounter (Appointment) with Rayetta Pigg, Meziah Blasingame C, PA-C.   Radiology:   Chest x-ray PA and lateral view 12/08/2017: No acute pneumonia nor CHF. Bilateral hilar lymphadenopathy and mild chronic interstitial prominence consistent with known sarcoidosis.  Cardiac Studies:   Coronary angiogram 09/12/2014: Normal LV systolic function at 10%, 40% ostial stenosis of the LAD, otherwise normal coronary arteries.  Exercise sestamibi stress test 03/20/2018: 1. The patient performed treadmill exercise using a Bruce protocol, completing 4:49 minutes. The patient completed an estimated workload of 6.81 METS, reaching 105% of the maximum predicted heart rate. No stress symptoms reported. Peak BP 220/98 mmHg. No ischemic changes seen on stress electrocardiogram. 2. The overall quality of the study is good. There is no evidence of abnormal lung activity. Stress and rest SPECT images demonstrate homogeneous tracer distribution throughout the myocardium. Gated SPECT imaging reveals normal myocardial thickening and wall motion. The left ventricular ejection fraction was normal (  56%). 3. Low risk study.  Echocardiogram 12/07/2019:  Left ventricle cavity is normal in size and wall thickness. Normal  LV systolic function with EF 56%. Normal global wall motion. Unable to evaluate diastolic function due to severity of mitral annular calcification.  Calculated EF 56%.  Left atrial cavity is severely dilated.  Severe calcification of the mitral valve annulus. Mild (Grade I) mitral regurgitation.  Mild tricuspid regurgitation. Estimated pulmonary artery systolic pressure is 30 mmHg.  No significant change compared to previous study on 04/04/2018.   Coronary CTA 01/25/2020:  1. Coronary calcium score of 201. This was 89th percentile for age and sex matched control. 2. Normal coronary origin with right dominance. 3.  Mild (25-49%) calcified plaque in a small D1. 4.  Moderate (50-69%) mixed plaque in the mid LCX and OM2. 5.  Will send study for FFRct. 6.  Moderate mitral annular calcification.  FFR findings consistent with nonobstructive coronary artery disease.  Extracardiac findings: Extensive mediastinal and bilateral hilar lymphadenopathy, compatible with reported clinical history of sarcoidosis. A few scattered tiny 2-4 mm pulmonary nodules noted in the lungs bilaterally, nonspecific, but statistically likely benign in this patient with history of sarcoidosis.   PCV ECHOCARDIOGRAM COMPLETE 13/24/4010 Normal LV systolic function with visual EF 55-60%. Left ventricle cavity is normal in size. The left ventricle is normal thickness, septal bulge. Normal global wall motion. Unable to evaluate diastolic function due to severity of mitral annular  calcification. Elevated LAP. Severe calcification of the mitral valve annulus. Native mitral valve with trace regurgitation. No evidence of mitral valve stenosis. Compared to prior study dated 12/07/2019 no significant change.  EKG   EKG 01/13/2021: Sinus rhythm rate of 76 bpm.  Left axis, left anterior fascicular block.  Poor progression, cannot exclude anteroseptal infarct old.  LVH with secondary ST-T abnormalities, cannot exclude anterolateral  ischemia.  EKG 12/26/2019: Normal sinus rhythm with rate of 67 bpm, left axis deviation, left anterior fascicular block.  IVCD, incomplete left bundle branch block.  T wave normality, cannot exclude anterolateral ischemia.   Compared to EKG 03/13/2018, anterolateral T inversion new.  Assessment   No diagnosis found.    No orders of the defined types were placed in this encounter.   There are no discontinued medications.   Recommendations:   MARICE GUIDONE  is a 70 y.o. 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 Christinia Gully, morbid obesity, severe obstructive sleep apnea on CPAP since August 2018 and follows Dr. Roddie Mc, hypertension, mild hyperlipidemia,  prediabetes, positive RNP antibody and also elevated ANA, had non-ischemic stress test in May 2019. Due to worsening dyspnea on exertion and abnormal EKG with marked T wave abnormality in anterolateral leads, underwent coronary CTA & repeat echocardiogram in 2021 revealing no obstructive CAD normal LVEF.   Patient presents for 74-monthfollow-up of hypertension, hyperlipidemia, CAD.  Last office visit patient was asymptomatic and recent coronary CTA with FFR was consistent with nonobstructive CAD, recommend primary prevention. ***  ***On GDMT. Needs lipid panel.   Patient presents for 4 week follow up of dyspnea and results of cardiac testing.  Reviewed and discussed results of echocardiogram with patient, details above. Echocardiogram is unchanged compared to prior with normal LVEF and severe calcification of mitral valve annulus. He BNP was also normal.  Patient's dyspnea on exertion has improved since last visit, she had a single episode which resolved with use of albuterol inhaler.  In view of normal BNP, unchanged echocardiogram, and recent CTA consistent with  nonobstructive coronary artery disease, suspicion for ischemia remains low.  Suspect dyspnea on exertion is related to deconditioning.  Had a long discussion  with patient regarding diet and lifestyle modifications including weight loss and increasing physical activity in order to improve dyspnea on exertion.  Did discuss with patient option of undergoing further ischemic evaluation, however shared decision was to hold off as symptoms have improved and she has recent coronary CTA with FFR findings consistent with nonobstructive CAD.  Patient will attempt to make diet and lifestyle modifications, as well as lose weight and reevaluate symptoms at next office visit.  She will notify our office if she experiences worsening symptoms.  Notably patient does have underlying who group 3 pulmonary hypertension, however repeat echocardiogram did not show worsening pulmonary hypertension.  Follow-up in 6 months, sooner if needed, for hypertension, hyperlipidemia, CAD.   Alethia Berthold, PA-C 08/11/2021, 10:09 AM Office: [phone]

## 2021-08-13 ENCOUNTER — Ambulatory Visit: Payer: BC Managed Care – PPO | Admitting: Student

## 2021-08-13 DIAGNOSIS — D862 Sarcoidosis of lung with sarcoidosis of lymph nodes: Secondary | ICD-10-CM

## 2021-08-13 DIAGNOSIS — I25118 Atherosclerotic heart disease of native coronary artery with other forms of angina pectoris: Secondary | ICD-10-CM

## 2021-08-13 DIAGNOSIS — I2723 Pulmonary hypertension due to lung diseases and hypoxia: Secondary | ICD-10-CM

## 2021-08-13 DIAGNOSIS — E7800 Pure hypercholesterolemia, unspecified: Secondary | ICD-10-CM

## 2021-08-17 ENCOUNTER — Ambulatory Visit: Payer: BC Managed Care – PPO | Admitting: Internal Medicine

## 2021-08-17 ENCOUNTER — Encounter: Payer: Self-pay | Admitting: Internal Medicine

## 2021-08-17 ENCOUNTER — Other Ambulatory Visit: Payer: Self-pay

## 2021-08-17 VITALS — BP 132/72 | HR 78 | Temp 97.9°F | Ht 65.4 in | Wt 233.2 lb

## 2021-08-17 DIAGNOSIS — M25551 Pain in right hip: Secondary | ICD-10-CM | POA: Diagnosis not present

## 2021-08-17 DIAGNOSIS — I10 Essential (primary) hypertension: Secondary | ICD-10-CM

## 2021-08-17 DIAGNOSIS — E66812 Obesity, class 2: Secondary | ICD-10-CM

## 2021-08-17 DIAGNOSIS — R7309 Other abnormal glucose: Secondary | ICD-10-CM | POA: Diagnosis not present

## 2021-08-17 DIAGNOSIS — Z8616 Personal history of COVID-19: Secondary | ICD-10-CM

## 2021-08-17 DIAGNOSIS — M546 Pain in thoracic spine: Secondary | ICD-10-CM

## 2021-08-17 DIAGNOSIS — Z6838 Body mass index (BMI) 38.0-38.9, adult: Secondary | ICD-10-CM

## 2021-08-17 DIAGNOSIS — Z23 Encounter for immunization: Secondary | ICD-10-CM

## 2021-08-17 MED ORDER — ZOSTER VAC RECOMB ADJUVANTED 50 MCG/0.5ML IM SUSR
0.5000 mL | Freq: Once | INTRAMUSCULAR | 0 refills | Status: AC
Start: 2021-08-17 — End: 2021-08-17

## 2021-08-17 NOTE — Progress Notes (Signed)
I,[name],acting as a Education administrator for [name], MD.,have documented all relevant documentation on the behalf of [name], MD,as directed by  [name], MD while in the presence of [name], MD.  This visit occurred during the SARS-CoV-2 public health emergency.  Safety protocols were in place, including screening questions prior to the visit, additional usage of staff PPE, and extensive cleaning of exam room while observing appropriate contact time as indicated for disinfecting solutions.  Subjective:     Patient ID: Kristine [name] , female    DOB: 1951/01/22 , 70 y.o.   MRN: [id number]   Chief Complaint  Patient presents with   Hypertension    HPI  She is here today for BP check. She reports compliance with meds. She denies headaches, chest pain and shortness of breath.   Hypertension This is a chronic problem. The current episode started more than 1 year ago. The problem has been gradually improving since onset. The problem is controlled. Pertinent negatives include no blurred vision, chest pain, palpitations or shortness of breath. Risk factors for coronary artery disease include obesity, post-menopausal state and sedentary lifestyle.  Back Pain This is a new problem. The current episode started more than 1 month ago. The problem occurs every several days. The problem is unchanged. The pain is present in the thoracic spine. The quality of the pain is described as stabbing. The pain does not radiate. The pain is at a severity of 5/10. The pain is moderate. The pain is Worse during the day. Pertinent negatives include no chest pain, perianal numbness, tingling or weakness. She has tried analgesics for the symptoms.    Past Medical History:  Diagnosis Date   Allergy    Arthritis    Back pain    Glaucoma    ROD   Hypertension    Joint pain    Kidney problem    Knee pain    Obesity    Sarcoidosis    Sleep apnea    SOB (shortness of breath)       Family History  Problem Relation Age of Onset   Breast cancer Mother    Lung cancer Mother    Lung cancer Father        smoked   Heart disease Maternal Grandmother    Hypertension Brother    Liver cancer Brother    Pneumonia Brother      Current Outpatient Medications:    albuterol (VENTOLIN HFA) 108 (90 Base) MCG/ACT inhaler, INHALE TWO PUFFS BY MOUTH EVERY 6 HOURS AS NEEDED FOR WHEEZING OR SHORTNESS OF BREATH, Disp: 18 g, Rfl: 1   aspirin EC 81 MG tablet, Take 1 tablet (81 mg total) by mouth daily., Disp: , Rfl:    atorvastatin (LIPITOR) 10 MG tablet, TAKE ONE TABLET BY MOUTH DAILY, Disp: 90 tablet, Rfl: 1   benzonatate (TESSALON PERLES) 100 MG capsule, Take 1 capsule (100 mg total) by mouth 3 (three) times daily as needed for cough., Disp: 30 capsule, Rfl: 1   Brinzolamide-Brimonidine 1-0.2 % SUSP, Apply 1 drop to eye 2 (two) times daily., Disp: , Rfl:    CALCIUM PO, Take by mouth., Disp: , Rfl:    Cholecalciferol (VITAMIN D3) 5000 UNITS CAPS, Take 5,000 Units by mouth daily., Disp: , Rfl:    fluticasone (FLONASE) 50 MCG/ACT nasal spray, Place 1 spray into both nostrils daily., Disp: 16 g, Rfl: 2   guaiFENesin-dextromethorphan (ROBITUSSIN DM) 100-10 MG/5ML syrup, Take 5 mLs by  mouth every 4 (four) hours as needed for cough., Disp: 118 mL, Rfl: 0   Magnesium 400 MG CAPS, Take by mouth., Disp: , Rfl:    metoprolol succinate (TOPROL-XL) 25 MG 24 hr tablet, TAKE ONE TABLET BY MOUTH DAILY, Disp: 90 tablet, Rfl: 1   montelukast (SINGULAIR) 10 MG tablet, Take 1 tablet (10 mg total) by mouth daily., Disp: 30 tablet, Rfl: 2   Semaglutide,0.25 or 0.5MG/DOS, (OZEMPIC, 0.25 OR 0.5 MG/DOSE,) 2 MG/1.5ML SOPN, Inject 0.5 mg into the skin once a week., Disp: 1.5 mL, Rfl: 1   telmisartan (MICARDIS) 80 MG tablet, Take 1 tablet (80 mg total) by mouth daily., Disp: 90 tablet, Rfl: 1   No Known Allergies   Review of Systems  Constitutional: Negative.   Eyes:  Negative for blurred vision.   Respiratory: Negative.  Negative for shortness of breath.   Cardiovascular: Negative.  Negative for chest pain and palpitations.  Gastrointestinal: Negative.   Musculoskeletal:  Positive for back pain.       She c/o "electric shocks" in her mid-back. Unable to state what triggers her sx. She denies UE/LE weakness.   Neurological: Negative.  Negative for tingling and weakness.    Today's Vitals   08/17/21 1534  BP: 132/72  Pulse: 78  Temp: 97.9 F (36.6 C)  TempSrc: Oral  Weight: 233 lb 3.2 oz (105.8 kg)  Height: 5' 5.4" (1.661 m)   Body mass index is 38.33 kg/m.  Wt Readings from Last 3 Encounters:  08/17/21 233 lb 3.2 oz (105.8 kg)  05/13/21 238 lb 12.8 oz (108.3 kg)  02/24/21 236 lb 9.6 oz (107.3 kg)    Objective:  Physical Exam Vitals and nursing note reviewed.  Constitutional:      Appearance: Normal appearance.  HENT:     Head: Normocephalic and atraumatic.     Nose:     Comments: Masked     Mouth/Throat:     Comments: Masked  Eyes:     Extraocular Movements: Extraocular movements intact.  Cardiovascular:     Rate and Rhythm: Normal rate and regular rhythm.     Heart sounds: Normal heart sounds.  Pulmonary:     Effort: Pulmonary effort is normal.     Breath sounds: Normal breath sounds.  Musculoskeletal:     Cervical back: Normal range of motion.     Comments: No point tenderness on thoracic spine. No paravertebral muscle tenderness.   Skin:    General: Skin is warm.  Neurological:     General: No focal deficit present.     Mental Status: She is alert.  Psychiatric:        Mood and Affect: Mood normal.        Behavior: Behavior normal.        Assessment And Plan:     1. Essential hypertension Comments: Chronic, fair control. Goal is less than 130/80. Encouraged to follow low sodium diet. She will f/u in six months for re-evaluation.  - CMP14+EGFR  2. Acute midline thoracic back pain Comments: She was given some stretching exercises. I will check  thoracic spine x-ray. She agrees to get x-rays at Ortho office.  - Ambulatory referral to Orthopedic Surgery  3. Right hip pain Comments: She has full ROM. She is established with [facility], I will refer her there for further evaluation and radiographic studies. She is also advised that her back/hip pain is likely related to poor core strength and would likely benefit from PT.  - Ambulatory referral  to Orthopedic Surgery  4. Other abnormal glucose Comments: Her a1c has been elevated in the past. I will recheck an a1c today. Encouraged to limit intake of sweetened beverages, including diet drink.  - CMP14+EGFR - Hemoglobin A1c  5. Class 2 severe obesity due to excess calories with serious comorbidity and body mass index (BMI) of 38.0 to 38.9 in adult [facility]) Comments: She is encouraged to strive for BMI less than 30 to decrease cardiac risk. Advised to aim for at least 150 minutes of exercise  6. Personal history of COVID-19 Comments: She is fully vaccinated and boosted x 2.   7. Immunization due - Zoster Vaccine Adjuvanted [facility]) injection; Inject 0.5 mLs into the muscle once for 1 dose. Administer 2nd dose in 2-6 months  Dispense: 0.5 mL; Refill: 0  Patient was given opportunity to ask questions. Patient verbalized understanding of the plan and was able to repeat key elements of the plan. All questions were answered to their satisfaction.   I, [name], MD, have reviewed all documentation for this visit. The documentation on 08/17/21 for the exam, diagnosis, procedures, and orders are all accurate and complete.   IF YOU HAVE BEEN REFERRED TO A SPECIALIST, IT MAY TAKE 1-2 WEEKS TO SCHEDULE/PROCESS THE REFERRAL. IF YOU HAVE NOT HEARD FROM US/SPECIALIST IN TWO WEEKS, PLEASE GIVE [place] A CALL AT [phone] X [phone].   THE PATIENT IS ENCOURAGED TO PRACTICE SOCIAL DISTANCING DUE TO THE COVID-19 PANDEMIC.

## 2021-08-17 NOTE — Patient Instructions (Signed)
Acute Back Pain, Adult Acute back pain is sudden and usually short-lived. It is often caused by an injury to the muscles and tissues in the back. The injury may result from: A muscle, tendon, or ligament getting overstretched or torn. Ligaments are tissues that connect bones to each other. Lifting something improperly can cause a back strain. Wear and tear (degeneration) of the spinal disks. Spinal disks are circular tissue that provide cushioning between the bones of the spine (vertebrae). Twisting motions, such as while playing sports or doing yard work. A hit to the back. Arthritis. You may have a physical exam, lab tests, and imaging tests to find the cause of your pain. Acute back pain usually goes away with rest and home care. Follow these instructions at home: Managing pain, stiffness, and swelling Take over-the-counter and prescription medicines only as told by your health care provider. Treatment may include medicines for pain and inflammation that are taken by mouth or applied to the skin, or muscle relaxants. Your health care provider may recommend applying ice during the first 24-48 hours after your pain starts. To do this: Put ice in a plastic bag. Place a towel between your skin and the bag. Leave the ice on for 20 minutes, 2-3 times a day. Remove the ice if your skin turns bright red. This is very important. If you cannot feel pain, heat, or cold, you have a greater risk of damage to the area. If directed, apply heat to the affected area as often as told by your health care provider. Use the heat source that your health care provider recommends, such as a moist heat pack or a heating pad. Place a towel between your skin and the heat source. Leave the heat on for 20-30 minutes. Remove the heat if your skin turns bright red. This is especially important if you are unable to feel pain, heat, or cold. You have a greater risk of getting burned. Activity  Do not stay in bed. Staying in  bed for more than 1-2 days can delay your recovery. Sit up and stand up straight. Avoid leaning forward when you sit or hunching over when you stand. If you work at a desk, sit close to it so you do not need to lean over. Keep your chin tucked in. Keep your neck drawn back, and keep your elbows bent at a 90-degree angle (right angle). Sit high and close to the steering wheel when you drive. Add lower back (lumbar) support to your car seat, if needed. Take short walks on even surfaces as soon as you are able. Try to increase the length of time you walk each day. Do not sit, drive, or stand in one place for more than 30 minutes at a time. Sitting or standing for long periods of time can put stress on your back. Do not drive or use heavy machinery while taking prescription pain medicine. Use proper lifting techniques. When you bend and lift, use positions that put less stress on your back: [place] your knees. Keep the load close to your body. Avoid twisting. Exercise regularly as told by your health care provider. Exercising helps your back heal faster and helps prevent back injuries by keeping muscles strong and flexible. Work with a physical therapist to make a safe exercise program, as recommended by your health care provider. Do any exercises as told by your physical therapist. Lifestyle Maintain a healthy weight. Extra weight puts stress on your back and makes it difficult to have good  posture. Avoid activities or situations that make you feel anxious or stressed. Stress and anxiety increase muscle tension and can make back pain worse. Learn ways to manage anxiety and stress, such as through exercise. General instructions Sleep on a firm mattress in a comfortable position. Try lying on your side with your knees slightly bent. If you lie on your back, put a pillow under your knees. Keep your head and neck in a straight line with your spine (neutral position) when using electronic equipment like  smartphones or pads. To do this: Raise your smartphone or pad to look at it instead of bending your head or neck to look down. Put the smartphone or pad at the level of your face while looking at the screen. Follow your treatment plan as told by your health care provider. This may include: Cognitive or behavioral therapy. Acupuncture or massage therapy. Meditation or yoga. Contact a health care provider if: You have pain that is not relieved with rest or medicine. You have increasing pain going down into your legs or buttocks. Your pain does not improve after 2 weeks. You have pain at night. You lose weight without trying. You have a fever or chills. You develop nausea or vomiting. You develop abdominal pain. Get help right away if: You develop new bowel or bladder control problems. You have unusual weakness or numbness in your arms or legs. You feel faint. These symptoms may represent a serious problem that is an emergency. Do not wait to see if the symptoms will go away. Get medical help right away. Call your local emergency services (911 in the [place] Do not drive yourself to the hospital. Summary Acute back pain is sudden and usually short-lived. Use proper lifting techniques. When you bend and lift, use positions that put less stress on your back. Take over-the-counter and prescription medicines only as told by your health care provider, and apply heat or ice as told. This information is not intended to replace advice given to you by your health care provider. Make sure you discuss any questions you have with your health care provider. Document Revised: 01/23/2021 Document Reviewed: 01/23/2021 Elsevier Patient Education  [place]. Hypertension, Adult High blood pressure (hypertension) is when the force of blood pumping through the arteries is too strong. The arteries are the blood vessels that carry blood from the heart throughout the body. Hypertension forces the heart to  work harder to pump blood and may cause arteries to become narrow or stiff. Untreated or uncontrolled hypertension can cause a heart attack, heart failure, a stroke, kidney disease, and other problems. A blood pressure reading consists of a higher number over a lower number. Ideally, your blood pressure should be below 120/80. The first ("top") number is called the systolic pressure. It is a measure of the pressure in your arteries as your heart beats. The second ("bottom") number is called the diastolic pressure. It is a measure of the pressure in your arteries as the heart relaxes. What are the causes? The exact cause of this condition is not known. There are some conditions that result in or are related to high blood pressure. What increases the risk? Some risk factors for high blood pressure are under your control. The following factors may make you more likely to develop this condition: Smoking. Having type 2 diabetes mellitus, high cholesterol, or both. Not getting enough exercise or physical activity. Being overweight. Having too much fat, sugar, calories, or salt (sodium) in your diet. Drinking too  much alcohol. Some risk factors for high blood pressure may be difficult or impossible to change. Some of these factors include: Having chronic kidney disease. Having a family history of high blood pressure. Age. Risk increases with age. Race. You may be at higher risk if you are African American. Gender. Men are at higher risk than women before age 88. After age 60, women are at higher risk than men. Having obstructive sleep apnea. Stress. What are the signs or symptoms? High blood pressure may not cause symptoms. Very high blood pressure (hypertensive crisis) may cause: Headache. Anxiety. Shortness of breath. Nosebleed. Nausea and vomiting. Vision changes. Severe chest pain. Seizures. How is this diagnosed? This condition is diagnosed by measuring your blood pressure while you are  seated, with your arm resting on a flat surface, your legs uncrossed, and your feet flat on the floor. The cuff of the blood pressure monitor will be placed directly against the skin of your upper arm at the level of your heart. It should be measured at least twice using the same arm. Certain conditions can cause a difference in blood pressure between your right and left arms. Certain factors can cause blood pressure readings to be lower or higher than normal for a short period of time: When your blood pressure is higher when you are in a health care provider's office than when you are at home, this is called white coat hypertension. Most people with this condition do not need medicines. When your blood pressure is higher at home than when you are in a health care provider's office, this is called masked hypertension. Most people with this condition may need medicines to control blood pressure. If you have a high blood pressure reading during one visit or you have normal blood pressure with other risk factors, you may be asked to: Return on a different day to have your blood pressure checked again. Monitor your blood pressure at home for 1 week or longer. If you are diagnosed with hypertension, you may have other blood or imaging tests to help your health care provider understand your overall risk for other conditions. How is this treated? This condition is treated by making healthy lifestyle changes, such as eating healthy foods, exercising more, and reducing your alcohol intake. Your health care provider may prescribe medicine if lifestyle changes are not enough to get your blood pressure under control, and if: Your systolic blood pressure is above 130. Your diastolic blood pressure is above 80. Your personal target blood pressure may vary depending on your medical conditions, your age, and other factors. Follow these instructions at home: Eating and drinking  Eat a diet that is high in fiber and  potassium, and low in sodium, added sugar, and fat. An example eating plan is called the DASH (Dietary Approaches to Stop Hypertension) diet. To eat this way: Eat plenty of fresh fruits and vegetables. Try to fill one half of your plate at each meal with fruits and vegetables. Eat whole grains, such as whole-wheat pasta, brown rice, or whole-grain bread. Fill about one fourth of your plate with whole grains. Eat or drink low-fat dairy products, such as skim milk or low-fat yogurt. Avoid fatty cuts of meat, processed or cured meats, and poultry with skin. Fill about one fourth of your plate with lean proteins, such as fish, chicken without skin, beans, eggs, or tofu. Avoid pre-made and processed foods. These tend to be higher in sodium, added sugar, and fat. Reduce your daily sodium intake.  Most people with hypertension should eat less than 1,500 mg of sodium a day. Do not drink alcohol if: Your health care provider tells you not to drink. You are pregnant, may be pregnant, or are planning to become pregnant. If you drink alcohol: Limit how much you use to: 0-1 drink a day for women. 0-2 drinks a day for men. Be aware of how much alcohol is in your drink. In the [place] one drink equals one 12 oz bottle of beer (355 mL), one 5 oz glass of wine (148 mL), or one 1 oz glass of hard liquor (44 mL). Lifestyle  Work with your health care provider to maintain a healthy body weight or to lose weight. Ask what an ideal weight is for you. Get at least 30 minutes of exercise most days of the week. Activities may include walking, swimming, or biking. Include exercise to strengthen your muscles (resistance exercise), such as Pilates or lifting weights, as part of your weekly exercise routine. Try to do these types of exercises for 30 minutes at least 3 days a week. Do not use any products that contain nicotine or tobacco, such as cigarettes, e-cigarettes, and chewing tobacco. If you need help quitting, ask your  health care provider. Monitor your blood pressure at home as told by your health care provider. Keep all follow-up visits as told by your health care provider. This is important. Medicines Take over-the-counter and prescription medicines only as told by your health care provider. Follow directions carefully. Blood pressure medicines must be taken as prescribed. Do not skip doses of blood pressure medicine. Doing this puts you at risk for problems and can make the medicine less effective. Ask your health care provider about side effects or reactions to medicines that you should watch for. Contact a health care provider if you: Think you are having a reaction to a medicine you are taking. Have headaches that keep coming back (recurring). Feel dizzy. Have swelling in your ankles. Have trouble with your vision. Get help right away if you: Develop a severe headache or confusion. Have unusual weakness or numbness. Feel faint. Have severe pain in your chest or abdomen. Vomit repeatedly. Have trouble breathing. Summary Hypertension is when the force of blood pumping through your arteries is too strong. If this condition is not controlled, it may put you at risk for serious complications. Your personal target blood pressure may vary depending on your medical conditions, your age, and other factors. For most people, a normal blood pressure is less than 120/80. Hypertension is treated with lifestyle changes, medicines, or a combination of both. Lifestyle changes include losing weight, eating a healthy, low-sodium diet, exercising more, and limiting alcohol. This information is not intended to replace advice given to you by your health care provider. Make sure you discuss any questions you have with your health care provider. Document Revised: 07/12/2018 Document Reviewed: 07/12/2018 Elsevier Patient Education  [place].

## 2021-08-18 LAB — CMP14+EGFR
ALT: 14 IU/L (ref 0–32)
AST: 15 IU/L (ref 0–40)
Albumin/Globulin Ratio: 1.6 (ref 1.2–2.2)
Albumin: 4.4 g/dL (ref 3.8–4.8)
Alkaline Phosphatase: 63 IU/L (ref 44–121)
BUN/Creatinine Ratio: 16 (ref 12–28)
BUN: 16 mg/dL (ref 8–27)
Bilirubin Total: 0.3 mg/dL (ref 0.0–1.2)
CO2: 23 mmol/L (ref 20–29)
Calcium: 9.5 mg/dL (ref 8.7–10.3)
Chloride: 104 mmol/L (ref 96–106)
Creatinine, Ser: 0.98 mg/dL (ref 0.57–1.00)
Globulin, Total: 2.7 g/dL (ref 1.5–4.5)
Glucose: 88 mg/dL (ref 70–99)
Potassium: 4.4 mmol/L (ref 3.5–5.2)
Sodium: 142 mmol/L (ref 134–144)
Total Protein: 7.1 g/dL (ref 6.0–8.5)
eGFR: 63 mL/min/{1.73_m2} (ref >59)

## 2021-08-18 LAB — HEMOGLOBIN A1C
Est. average glucose Bld gHb Est-mCnc: 108 mg/dL
Hgb A1c MFr Bld: 5.4 % (ref 4.8–5.6)

## 2021-09-01 ENCOUNTER — Other Ambulatory Visit: Payer: Self-pay

## 2021-09-01 ENCOUNTER — Ambulatory Visit
Admission: RE | Admit: 2021-09-01 | Discharge: 2021-09-01 | Disposition: A | Discharge status: Home / Self Care | Payer: BC Managed Care – PPO | Source: Ambulatory Visit | Attending: Internal Medicine | Admitting: Internal Medicine

## 2021-09-01 DIAGNOSIS — Z1231 Encounter for screening mammogram for malignant neoplasm of breast: Secondary | ICD-10-CM

## 2021-10-07 ENCOUNTER — Other Ambulatory Visit: Payer: Self-pay | Admitting: Internal Medicine

## 2021-10-28 ENCOUNTER — Encounter: Payer: Self-pay | Admitting: Internal Medicine

## 2021-11-04 ENCOUNTER — Encounter: Payer: Self-pay | Admitting: Internal Medicine

## 2021-11-12 ENCOUNTER — Other Ambulatory Visit: Payer: Self-pay

## 2021-11-12 ENCOUNTER — Ambulatory Visit (INDEPENDENT_AMBULATORY_CARE_PROVIDER_SITE_OTHER): Payer: BC Managed Care – PPO | Admitting: Nurse Practitioner

## 2021-11-12 VITALS — BP 128/70 | HR 87 | Temp 98.5°F | Ht 65.4 in | Wt 222.0 lb

## 2021-11-12 DIAGNOSIS — H6501 Acute serous otitis media, right ear: Secondary | ICD-10-CM

## 2021-11-12 DIAGNOSIS — E782 Mixed hyperlipidemia: Secondary | ICD-10-CM | POA: Diagnosis not present

## 2021-11-12 DIAGNOSIS — Z0000 Encounter for general adult medical examination without abnormal findings: Secondary | ICD-10-CM | POA: Diagnosis not present

## 2021-11-12 DIAGNOSIS — E559 Vitamin D deficiency, unspecified: Secondary | ICD-10-CM

## 2021-11-12 DIAGNOSIS — R0981 Nasal congestion: Secondary | ICD-10-CM

## 2021-11-12 DIAGNOSIS — I10 Essential (primary) hypertension: Secondary | ICD-10-CM

## 2021-11-12 DIAGNOSIS — R7309 Other abnormal glucose: Secondary | ICD-10-CM | POA: Diagnosis not present

## 2021-11-12 DIAGNOSIS — M170 Bilateral primary osteoarthritis of knee: Secondary | ICD-10-CM | POA: Diagnosis not present

## 2021-11-12 DIAGNOSIS — Z6836 Body mass index (BMI) 36.0-36.9, adult: Secondary | ICD-10-CM

## 2021-11-12 LAB — POCT UA - MICROALBUMIN
Albumin/Creatinine Ratio, Urine, POC: <30
Creatinine, POC: 300 mg/dL
Microalbumin Ur, POC: 10 mg/L

## 2021-11-12 LAB — POCT URINALYSIS DIPSTICK
Bilirubin, UA: NEGATIVE
Blood, UA: NEGATIVE
Glucose, UA: NEGATIVE
Ketones, UA: NEGATIVE
Leukocytes, UA: NEGATIVE
Nitrite, UA: NEGATIVE
Protein, UA: NEGATIVE
Spec Grav, UA: >=1.03 — AB (ref 1.010–1.025)
Urobilinogen, UA: 0.2 E.U./dL
pH, UA: 5.5 (ref 5.0–8.0)

## 2021-11-12 MED ORDER — AMOXICILLIN-POT CLAVULANATE 875-125 MG PO TABS: 1.0000 | ORAL_TABLET | Freq: Two times a day (BID) | ORAL | 0 refills | Status: AC

## 2021-11-12 NOTE — Progress Notes (Signed)
I,[name],acting as a Education administrator for [employer], NP.,have documented all relevant documentation on the behalf of [employer], NP,as directed by  Bary Castilla, NP while in the presence of Bary Castilla, NP.  This visit occurred during the SARS-CoV-2 public health emergency.  Safety protocols were in place, including screening questions prior to the visit, additional usage of staff PPE, and extensive cleaning of exam room while observing appropriate contact time as indicated for disinfecting solutions.  Subjective:     Patient ID: Kristine [name] , female    DOB: Apr 03, 1951 , 70 y.o.   MRN: [id number]   Chief Complaint  Patient presents with   Annual Exam    HPI  She is here today for a full physical examination. She is followed by GYN, Dr. Charlesetta Garibaldi for her pelvic exams.  She states that she got a flu shot in October but we don't have a record.  She takes ozempic. Diet: She is not a lot. She doesn't have a big appetite.  Exercise: She is not getting a lot of exercise done.  She is going to the dentist soon. She has a eye appt coming in [place]  She is also seeing a orthopedic for both of her knees.  Wt Readings from Last 3 Encounters: She works at [employer].  11/12/21 : 222 lb (100.7 kg) 08/17/21 : 233 lb 3.2 oz (105.8 kg) 05/13/21 : 238 lb 12.8 oz (108.3 kg)    Hypertension This is a chronic problem. The current episode started more than 1 year ago. The problem has been gradually improving since onset. The problem is controlled. Pertinent negatives include no blurred vision, chest pain, headaches, palpitations or shortness of breath. Risk factors for coronary artery disease include sedentary lifestyle, obesity and post-menopausal state. The current treatment provides moderate improvement.    Past Medical History:  Diagnosis Date   Allergy    Arthritis    Back pain    Glaucoma    ROD   Hypertension    Joint pain    Kidney problem    Knee pain    Obesity     Sarcoidosis    Sleep apnea    SOB (shortness of breath)      Family History  Problem Relation Age of Onset   Breast cancer Mother    Lung cancer Mother    Lung cancer Father        smoked   Heart disease Maternal Grandmother    Hypertension Brother    Liver cancer Brother    Pneumonia Brother      Current Outpatient Medications:    amoxicillin-clavulanate (AUGMENTIN) 875-125 MG tablet, Take 1 tablet by mouth 2 (two) times daily for 7 days., Disp: 14 tablet, Rfl: 0   albuterol (VENTOLIN HFA) 108 (90 Base) MCG/ACT inhaler, INHALE TWO PUFFS BY MOUTH EVERY 6 HOURS AS NEEDED FOR WHEEZING OR SHORTNESS OF BREATH, Disp: 18 g, Rfl: 1   aspirin EC 81 MG tablet, Take 1 tablet (81 mg total) by mouth daily., Disp: , Rfl:    atorvastatin (LIPITOR) 10 MG tablet, TAKE ONE TABLET BY MOUTH DAILY, Disp: 90 tablet, Rfl: 1   benzonatate (TESSALON PERLES) 100 MG capsule, Take 1 capsule (100 mg total) by mouth 3 (three) times daily as needed for cough., Disp: 30 capsule, Rfl: 1   Brinzolamide-Brimonidine 1-0.2 % SUSP, Apply 1 drop to eye 2 (two) times daily., Disp: , Rfl:    CALCIUM PO, Take by mouth., Disp: , Rfl:  Cholecalciferol (VITAMIN D3) 5000 UNITS CAPS, Take 5,000 Units by mouth daily., Disp: , Rfl:    fluticasone (FLONASE) 50 MCG/ACT nasal spray, Place 1 spray into both nostrils daily., Disp: 16 g, Rfl: 2   guaiFENesin-dextromethorphan (ROBITUSSIN DM) 100-10 MG/5ML syrup, Take 5 mLs by mouth every 4 (four) hours as needed for cough., Disp: 118 mL, Rfl: 0   Magnesium 400 MG CAPS, Take by mouth., Disp: , Rfl:    metoprolol succinate (TOPROL-XL) 25 MG 24 hr tablet, TAKE ONE TABLET BY MOUTH DAILY, Disp: 90 tablet, Rfl: 1   montelukast (SINGULAIR) 10 MG tablet, Take 1 tablet (10 mg total) by mouth daily., Disp: 30 tablet, Rfl: 2   OZEMPIC, 0.25 OR 0.5 MG/DOSE, 2 MG/1.5ML SOPN, DIAL AND INJECT UNDER THE SKIN 0.5 MG WEEKLY, Disp: 1.5 mL, Rfl: 1   telmisartan (MICARDIS) 80 MG tablet, Take 1 tablet (80  mg total) by mouth daily., Disp: 90 tablet, Rfl: 1   No Known Allergies    The patient states she uses none for birth control. Last LMP was No LMP recorded. Patient has had a hysterectomy.. Negative for Dysmenorrhea. Negative for: breast discharge, breast lump(s), breast pain and breast self exam. Associated symptoms include abnormal vaginal bleeding. Pertinent negatives include abnormal bleeding (hematology), anxiety, decreased libido, depression, difficulty falling sleep, dyspareunia, history of infertility, nocturia, sexual dysfunction, sleep disturbances, urinary incontinence, urinary urgency, vaginal discharge and vaginal itching. Diet regular.The patient states her exercise level is    . The patient's tobacco use is:  Social History   Tobacco Use  Smoking Status Never  Smokeless Tobacco Never  . She has been exposed to passive smoke. The patient's alcohol use is:  Social History   Substance and Sexual Activity  Alcohol Use Yes   Comment: occ  . Additional information: Last pap 2022, next one scheduled for .    Review of Systems  Constitutional: Negative.  Negative for chills and fever.  HENT:  Positive for congestion and ear pain. Negative for hearing loss, sinus pain and sneezing.   Eyes: Negative.  Negative for blurred vision.  Respiratory: Negative.  Negative for cough and shortness of breath.   Cardiovascular: Negative.  Negative for chest pain and palpitations.  Gastrointestinal: Negative.  Negative for constipation, diarrhea, nausea and vomiting.  Endocrine: Negative.  Negative for polydipsia, polyphagia and polyuria.  Genitourinary: Negative.  Negative for dysuria.  Musculoskeletal: Negative.  Negative for arthralgias and myalgias.  Skin: Negative.   Allergic/Immunologic: Negative.   Neurological: Negative.  Negative for dizziness, weakness and headaches.  Hematological: Negative.   Psychiatric/Behavioral: Negative.      Today's Vitals   11/12/21 1415  BP: 128/70   Pulse: 87  Temp: 98.5 F (36.9 C)  TempSrc: Oral  Weight: 222 lb (100.7 kg)  Height: 5' 5.4" (1.661 m)   Body mass index is 36.49 kg/m.  Wt Readings from Last 3 Encounters:  11/12/21 222 lb (100.7 kg)  08/17/21 233 lb 3.2 oz (105.8 kg)  05/13/21 238 lb 12.8 oz (108.3 kg)    Objective:  Physical Exam Vitals and nursing note reviewed.  Constitutional:      Appearance: Normal appearance.  HENT:     Head: Normocephalic and atraumatic.     Right Ear: Tympanic membrane, ear canal and external ear normal. Drainage present. There is no impacted cerumen.     Left Ear: Tympanic membrane, ear canal and external ear normal. There is no impacted cerumen.     Nose: Congestion present.  Mouth/Throat:     Mouth: Mucous membranes are moist.     Pharynx: Oropharynx is clear.  Eyes:     Extraocular Movements: Extraocular movements intact.     Conjunctiva/sclera: Conjunctivae normal.     Pupils: Pupils are equal, round, and reactive to light.  Cardiovascular:     Rate and Rhythm: Normal rate and regular rhythm.     Pulses: Normal pulses.     Heart sounds: Normal heart sounds.  Pulmonary:     Effort: Pulmonary effort is normal.     Breath sounds: Normal breath sounds.  Chest:  Breasts:    Tanner Score is 5.  Abdominal:     General: Abdomen is flat. Bowel sounds are normal.     Palpations: Abdomen is soft.  Genitourinary:    Comments: deferred Musculoskeletal:        General: Normal range of motion.     Cervical back: Normal range of motion and neck supple.  Skin:    General: Skin is warm and dry.  Neurological:     General: No focal deficit present.     Mental Status: She is alert and oriented to person, place, and time.  Psychiatric:        Mood and Affect: Mood normal.        Behavior: Behavior normal.        Assessment And Plan:     1. Routine general medical examination at health care facility --Patient is here for their annual physical exam and we discussed any  changes to medication and medical history.  -Behavior modification was discussed as well as diet and exercise history  -Patient will continue to exercise regularly and modify their diet.  -Recommendation for yearly physical annuals, immunization and screenings including mammogram and colonoscopy were discussed with the patient.  -Recommended intake of multivitamin, vitamin D and calcium.  -Individualized advise was given to the patient pertaining to their own health history in regards to diet, exercise, medical condition and referrals.   2. Essential hypertension -Chronic, controlled -Continue meds  - POCT Urinalysis Dipstick (81002) - POCT UA - Microalbumin - CBC - CMP14+EGFR  3. Other abnormal glucose - Hemoglobin A1c  4. Mixed hyperlipidemia -Chronic, continue meds  - Lipid panel  5. Vitamin D deficiency -Will check and supplement if needed. Advised patient to spend atleast 15 min. Daily in sunlight.   6. Primary osteoarthritis of both knees -Followed by orthopedic   7. Nasal congestion - amoxicillin-clavulanate (AUGMENTIN) 875-125 MG tablet; Take 1 tablet by mouth 2 (two) times daily for 7 days.  Dispense: 14 tablet; Refill: 0  8. Right acute serous otitis media, recurrence not specified - amoxicillin-clavulanate (AUGMENTIN) 875-125 MG tablet; Take 1 tablet by mouth 2 (two) times daily for 7 days.  Dispense: 14 tablet; Refill: 0  9. Class 2 severe obesity due to excess calories with serious comorbidity and body mass index (BMI) of 36.0 to 36.9 in adult [facility]) Advised patient on a healthy diet including avoiding fast food and red meats. Increase the intake of lean meats including grilled chicken and [place].  Drink a lot of water. Decrease intake of fatty foods. Exercise for 30-45 min. 4-5 a week to decrease the risk of cardiac event.   The patient was encouraged to call or send a message through [place] for any questions or concerns.   Follow up: if symptoms persist or do not  get better.   Side effects and appropriate use of all the medication(s) were discussed with  the patient today. Patient advised to use the medication(s) as directed by their healthcare provider. The patient was encouraged to read, review, and understand all associated package inserts and contact our office with any questions or concerns. The patient accepts the risks of the treatment plan and had an opportunity to ask questions.   Patient was given opportunity to ask questions. Patient verbalized understanding of the plan and was able to repeat key elements of the plan. All questions were answered to their satisfaction.  Raman Milos Milligan, DNP   I, Raman Leontae Bostock have reviewed all documentation for this visit. The documentation on 11/14/21 for the exam, diagnosis, procedures, and orders are all accurate and complete.   Patient was given opportunity to ask questions. Patient verbalized understanding of the plan and was able to repeat key elements of the plan. All questions were answered to their satisfaction.   Bary Castilla, NP   I, Bary Castilla, NP, have reviewed all documentation for this visit. The documentation on 11/14/21 for the exam, diagnosis, procedures, and orders are all accurate and complete.  THE PATIENT IS ENCOURAGED TO PRACTICE SOCIAL DISTANCING DUE TO THE COVID-19 PANDEMIC.

## 2021-11-12 NOTE — Patient Instructions (Signed)

## 2021-11-13 LAB — CMP14+EGFR
ALT: 17 IU/L (ref 0–32)
AST: 14 IU/L (ref 0–40)
Albumin/Globulin Ratio: 1.7 (ref 1.2–2.2)
Albumin: 4.2 g/dL (ref 3.8–4.8)
Alkaline Phosphatase: 65 IU/L (ref 44–121)
BUN/Creatinine Ratio: 20 (ref 12–28)
BUN: 18 mg/dL (ref 8–27)
Bilirubin Total: <0.2 mg/dL (ref 0.0–1.2)
CO2: 24 mmol/L (ref 20–29)
Calcium: 9.5 mg/dL (ref 8.7–10.3)
Chloride: 105 mmol/L (ref 96–106)
Creatinine, Ser: 0.89 mg/dL (ref 0.57–1.00)
Globulin, Total: 2.5 g/dL (ref 1.5–4.5)
Glucose: 84 mg/dL (ref 70–99)
Potassium: 4.1 mmol/L (ref 3.5–5.2)
Sodium: 141 mmol/L (ref 134–144)
Total Protein: 6.7 g/dL (ref 6.0–8.5)
eGFR: 70 mL/min/{1.73_m2} (ref >59)

## 2021-11-13 LAB — CBC
Hematocrit: 35.9 % (ref 34.0–46.6)
Hemoglobin: 11.6 g/dL (ref 11.1–15.9)
MCH: 26.4 pg — ABNORMAL LOW (ref 26.6–33.0)
MCHC: 32.3 g/dL (ref 31.5–35.7)
MCV: 82 fL (ref 79–97)
Platelets: 260 10*3/uL (ref 150–450)
RBC: 4.4 x10E6/uL (ref 3.77–5.28)
RDW: 13.4 % (ref 11.7–15.4)
WBC: 7.5 10*3/uL (ref 3.4–10.8)

## 2021-11-13 LAB — HEMOGLOBIN A1C
Est. average glucose Bld gHb Est-mCnc: 117 mg/dL
Hgb A1c MFr Bld: 5.7 % — ABNORMAL HIGH (ref 4.8–5.6)

## 2021-11-13 LAB — LIPID PANEL
Chol/HDL Ratio: 2.3 ratio (ref 0.0–4.4)
Cholesterol, Total: 130 mg/dL (ref 100–199)
HDL: 57 mg/dL (ref >39)
LDL Chol Calc (NIH): 57 mg/dL (ref 0–99)
Triglycerides: 82 mg/dL (ref 0–149)
VLDL Cholesterol Cal: 16 mg/dL (ref 5–40)

## 2021-11-14 ENCOUNTER — Encounter: Payer: Self-pay | Admitting: Nurse Practitioner

## 2021-11-18 ENCOUNTER — Other Ambulatory Visit: Payer: Self-pay | Admitting: Internal Medicine

## 2021-12-10 ENCOUNTER — Other Ambulatory Visit: Payer: Self-pay

## 2021-12-10 ENCOUNTER — Encounter: Payer: Self-pay | Admitting: Internal Medicine

## 2021-12-10 MED ORDER — NYSTATIN 100000 UNIT/GM EX POWD: 1.0000 "application " | Freq: Three times a day (TID) | CUTANEOUS | 0 refills | Status: DC

## 2021-12-24 ENCOUNTER — Other Ambulatory Visit: Payer: Self-pay

## 2021-12-24 MED ORDER — OZEMPIC (0.25 OR 0.5 MG/DOSE) 2 MG/1.5ML ~~LOC~~ SOPN: PEN_INJECTOR | SUBCUTANEOUS | 1 refills | Status: DC

## 2022-02-03 ENCOUNTER — Encounter: Payer: Self-pay | Admitting: Student

## 2022-02-03 ENCOUNTER — Other Ambulatory Visit: Payer: Self-pay

## 2022-02-03 ENCOUNTER — Ambulatory Visit: Payer: BC Managed Care – PPO | Admitting: Student

## 2022-02-03 VITALS — BP 137/84 | HR 85 | Temp 98.0°F | Resp 16 | Ht 65.0 in | Wt 221.0 lb

## 2022-02-03 DIAGNOSIS — I25118 Atherosclerotic heart disease of native coronary artery with other forms of angina pectoris: Secondary | ICD-10-CM

## 2022-02-03 DIAGNOSIS — Z01810 Encounter for preprocedural cardiovascular examination: Secondary | ICD-10-CM

## 2022-02-03 DIAGNOSIS — R0609 Other forms of dyspnea: Secondary | ICD-10-CM

## 2022-02-03 NOTE — Progress Notes (Signed)
? ?Primary Physician/Referring:  Glendale Chard, MD ? ?Patient ID: Kristine [name], female    DOB: 04/29/1951, 71 y.o.   MRN: [id number] ? ?Chief Complaint  ?Patient presents with  ? Shortness of Breath  ? Medical Clearance  ?  Knee replacement RT  ? ?HPI:   ? ?Kristine [name]  is a 71 y.o. 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 Christinia Gully, morbid obesity, severe obstructive sleep apnea on CPAP since August 2018 and follows Dr. Roddie Mc, hypertension, mild hyperlipidemia,  prediabetes, positive RNP antibody and also elevated ANA, had non-ischemic stress test in May 2019. Due to worsening dyspnea on exertion and abnormal EKG with marked T wave abnormality in anterolateral leads, underwent coronary CTA & repeat echocardiogram in 2021 revealing no obstructive CAD normal LVEF.  ? ?Patient was seen by pulmonary team in 07/2020 at which time her PFTs and CT of the chest were both stable without evidence of active sarcoidosis, it was suspected dyspnea was related to deconditioning.  ? ?Patient presents for 1 year follow-up as well as preoperative risk stratification given upcoming right knee arthroplasty.  Patient is overall feeling well without specific complaints today.  Denies chest pain.  She has rare and mild dyspnea with extensive exertion.  She has no formal exercise routine but does walk down the stairs multiple times per day without issue.  Denies leg swelling, orthopnea, PND, syncope, near syncope, palpitations. ? ?Past Medical History:  ?Diagnosis Date  ? Allergy   ? Arthritis   ? Back pain   ? Glaucoma   ? ROD  ? Hypertension   ? Joint pain   ? Kidney problem   ? Knee pain   ? Obesity   ? Sarcoidosis   ? Sleep apnea   ? SOB (shortness of breath)   ? ?Past Surgical History:  ?Procedure Laterality Date  ? ABDOMINAL HYSTERECTOMY    ? BUNIONECTOMY    ? left  ? CHOLECYSTECTOMY    ? KNEE SURGERY    ? right  ? LEFT HEART CATHETERIZATION WITH CORONARY ANGIOGRAM N/A 09/12/2014  ? Procedure:  LEFT HEART CATHETERIZATION WITH CORONARY ANGIOGRAM;  Surgeon: Troy Sine, MD;  Location: [facility] CATH LAB;  Service: Cardiovascular;  Laterality: N/A;  ? ?Family History  ?Problem Relation Age of Onset  ? Breast cancer Mother   ? Lung cancer Mother   ? Lung cancer Father   ?     smoked  ? Heart disease Maternal Grandmother   ? Hypertension Brother   ? Liver cancer Brother   ? Pneumonia Brother   ? ?Social History  ? ?Tobacco Use  ? Smoking status: Never  ? Smokeless tobacco: Never  ?Substance Use Topics  ? Alcohol use: Yes  ?  Comment: occ  ?Marital Status: Married ? ?ROS  ?Review of Systems  ?Cardiovascular:  Positive for dyspnea on exertion (single brief episode following extensive exertion). Negative for chest pain, claudication, leg swelling, near-syncope, orthopnea, palpitations, paroxysmal nocturnal dyspnea and syncope.  ?Gastrointestinal:  Negative for melena.  ?Neurological:  Negative for dizziness.  ? ?Objective  ?Blood pressure 137/84, pulse 85, temperature 98 ?F (36.7 ?C), resp. rate 16, height _0  (1.651 m), weight 221 lb (100.2 kg), SpO2 97 %.  ? ?  02/03/2022  ? 10:59 AM 02/03/2022  ? 10:56 AM 11/12/2021  ?  2:15 PM  ?Vitals with BMI  ?Height  _1  5' 5.4"  ?Weight  221 lbs 222 lbs  ?BMI  36.78 36.5  ?  Systolic 161 096 045  ?Diastolic 84 89 70  ?Pulse 85 80 87  ?  ? Physical Exam ?Vitals reviewed.  ?Neck:  ?   Thyroid: No thyromegaly.  ?Cardiovascular:  ?   Rate and Rhythm: Normal rate and regular rhythm.  ?   Pulses: Intact distal pulses.  ?   Heart sounds: Normal heart sounds, S1 normal and S2 normal. No murmur heard. ?  No gallop.  ?   Comments: No leg edema, no JVD. ?Pulmonary:  ?   Effort: Pulmonary effort is normal. No respiratory distress.  ?   Breath sounds: Normal breath sounds. No wheezing, rhonchi or rales.  ?Abdominal:  ?   Comments: Obese and pannus is present.  ?Musculoskeletal:  ?   Right lower leg: No edema.  ?   Left lower leg: No edema.  ?Skin: ?   General: Skin is warm and dry.   ?Neurological:  ?   Mental Status: She is alert.  ? ?Laboratory examination:  ? ?Recent Labs  ?  05/13/21 ?1452 08/17/21 ?1622 11/12/21 ?1501  ?NA 143 142 141  ?K 4.5 4.4 4.1  ?CL 104 104 105  ?CO2 _0 ?GLUCOSE 81 88 84  ?BUN _1 ?CREATININE 0.94 0.98 0.89  ?CALCIUM 9.5 9.5 9.5  ? ?CrCl cannot be calculated (Patient's most recent lab result is older than the maximum 21 days allowed.).  ? ?  Latest Ref Rng & Units 11/12/2021  ?  3:01 PM 08/17/2021  ?  4:22 PM 05/13/2021  ?  2:52 PM  ?CMP  ?Glucose 70 - 99 mg/dL 84   88   81    ?BUN 8 - 27 mg/dL _2 ?Creatinine 0.57 - 1.00 mg/dL 0.89   0.98   0.94    ?Sodium 134 - 144 mmol/L 141   142   143    ?Potassium 3.5 - 5.2 mmol/L 4.1   4.4   4.5    ?Chloride 96 - 106 mmol/L 105   104   104    ?CO2 20 - 29 mmol/L _3 ?Calcium 8.7 - 10.3 mg/dL 9.5   9.5   9.5    ?Total Protein 6.0 - 8.5 g/dL 6.7   7.1     ?Total Bilirubin 0.0 - 1.2 mg/dL <0.2   0.3     ?Alkaline Phos 44 - 121 IU/L 65   63     ?AST 0 - 40 IU/L 14   15     ?ALT 0 - 32 IU/L 17   14     ? ? ?  Latest Ref Rng & Units 11/12/2021  ?  3:01 PM 01/12/2021  ? 10:57 AM 07/14/2020  ? 11:09 AM  ?CBC  ?WBC 3.4 - 10.8 x10E3/uL 7.5   9.5   6.3    ?Hemoglobin 11.1 - 15.9 g/dL 11.6   12.8   11.8    ?Hematocrit 34.0 - 46.6 % 35.9   39.0   35.4    ?Platelets 150 - 450 x10E3/uL 260   263   244    ? ?Lipid Panel  ?   ?Component Value Date/Time  ? CHOL 130 11/12/2021 1501  ? TRIG 82 11/12/2021 1501  ? HDL 57 11/12/2021 1501  ? CHOLHDL 2.3 11/12/2021 1501  ? CHOLHDL 4.1 04/05/2013 0858  ? VLDL 15 04/05/2013 0858  ? LDLCALC 57 11/12/2021 1501  ? ?  HEMOGLOBIN A1C ?Lab Results  ?Component Value Date  ? HGBA1C 5.7 (H) 11/12/2021  ? MPG 117 (H) 07/11/2014  ? ?TSH ?No results for input(s): TSH in the last 8760 hours. ? ?BNP ?   ?Component Value Date/Time  ? BNP 17.4 01/14/2021 1616  ? ? ?ProBNP ?   ?Component Value Date/Time  ? PROBNP 34.0 07/30/2016 1623  ? ? ? ?External labs  ?03/14/2018: Cholesterol 175,  triglycerides 78, HDL 47, LDL 112. Sedimentation rate 16. ? 12/27/2017: Hemoglobin A1c 6.2%. Creatinine 0.92, EGFR 76/66, potassium 4.0, BMP normal. Vitamin B12 308. ? 05/02/2017: Anti--DNA less than 1. RNP antibodies 1.1 elevated. Sjorgens negative. ANA direct positive. TSH 2.6. Vitamin D 39.  ?03/07/2017: Cholesterol 183, triglycerides 66, HDL 56, LDL 114. ? ?Allergies  ?No Known Allergies  ? ?Medications Prior to Visit:  ? ?Outpatient Medications Prior to Visit  ?Medication Sig Dispense Refill  ? albuterol (VENTOLIN HFA) 108 (90 Base) MCG/ACT inhaler INHALE TWO PUFFS BY MOUTH EVERY 6 HOURS AS NEEDED FOR WHEEZING OR SHORTNESS OF BREATH 18 g 1  ? aspirin EC 81 MG tablet Take 1 tablet (81 mg total) by mouth daily.    ? atorvastatin (LIPITOR) 10 MG tablet TAKE ONE TABLET BY MOUTH DAILY 90 tablet 1  ? Brinzolamide-Brimonidine 1-0.2 % SUSP Apply 1 drop to eye 2 (two) times daily.    ? CALCIUM PO Take by mouth.    ? Cholecalciferol (VITAMIN D3) 5000 UNITS CAPS Take 5,000 Units by mouth daily.    ? fluticasone (FLONASE) 50 MCG/ACT nasal spray Place 1 spray into both nostrils daily. 16 g 2  ? Magnesium 400 MG CAPS Take by mouth.    ? metoprolol succinate (TOPROL-XL) 25 MG 24 hr tablet TAKE ONE TABLET BY MOUTH DAILY 90 tablet 1  ? nystatin powder Apply 1 application topically 3 (three) times daily. 15 g 0  ? Semaglutide,0.25 or 0.5MG/DOS, (OZEMPIC, 0.25 OR 0.5 MG/DOSE,) 2 MG/1.5ML SOPN DIAL AND INJECT UNDER THE SKIN 0.5 MG WEEKLY 1.5 mL 1  ? telmisartan (MICARDIS) 80 MG tablet Take 1 tablet (80 mg total) by mouth daily. 90 tablet 1  ? benzonatate (TESSALON PERLES) 100 MG capsule Take 1 capsule (100 mg total) by mouth 3 (three) times daily as needed for cough. 30 capsule 1  ? guaiFENesin-dextromethorphan (ROBITUSSIN DM) 100-10 MG/5ML syrup Take 5 mLs by mouth every 4 (four) hours as needed for cough. 118 mL 0  ? montelukast (SINGULAIR) 10 MG tablet Take 1 tablet (10 mg total) by mouth daily. 30 tablet 2  ? ?No  facility-administered medications prior to visit.  ? ?Final Medications at End of Visit   ? ?Current Meds  ?Medication Sig  ? albuterol (VENTOLIN HFA) 108 (90 Base) MCG/ACT inhaler INHALE TWO PUFFS BY MOUTH EVERY 6 HOURS AS

## 2022-02-09 ENCOUNTER — Other Ambulatory Visit: Payer: Self-pay

## 2022-02-09 ENCOUNTER — Ambulatory Visit: Payer: BC Managed Care – PPO | Admitting: Internal Medicine

## 2022-02-09 ENCOUNTER — Encounter: Payer: Self-pay | Admitting: Internal Medicine

## 2022-02-09 VITALS — BP 122/80 | HR 79 | Temp 98.2°F | Ht 65.0 in | Wt 221.8 lb

## 2022-02-09 DIAGNOSIS — I2510 Atherosclerotic heart disease of native coronary artery without angina pectoris: Secondary | ICD-10-CM | POA: Diagnosis not present

## 2022-02-09 DIAGNOSIS — I10 Essential (primary) hypertension: Secondary | ICD-10-CM

## 2022-02-09 DIAGNOSIS — I700 Atherosclerosis of aorta: Secondary | ICD-10-CM

## 2022-02-09 DIAGNOSIS — I119 Hypertensive heart disease without heart failure: Secondary | ICD-10-CM | POA: Diagnosis not present

## 2022-02-09 DIAGNOSIS — Z01818 Encounter for other preprocedural examination: Secondary | ICD-10-CM

## 2022-02-09 DIAGNOSIS — Z23 Encounter for immunization: Secondary | ICD-10-CM | POA: Diagnosis not present

## 2022-02-09 DIAGNOSIS — Z6836 Body mass index (BMI) 36.0-36.9, adult: Secondary | ICD-10-CM

## 2022-02-09 NOTE — Progress Notes (Signed)
?This visit occurred during the SARS-CoV-2 public health emergency.  Safety protocols were in place, including screening questions prior to the visit, additional usage of staff PPE, and extensive cleaning of exam room while observing appropriate contact time as indicated for disinfecting solutions. ? ?Subjective:  ?  ? Patient ID: Kristine [name] , female    DOB: [birth year] , 71 y.o.   MRN: [id number] ? ? ?Chief Complaint  ?Patient presents with  ? Hypertension  ? ? ?HPI ? ?She is here today for BP check. She reports compliance with meds. She denies headaches, chest pain and shortness of breath. Patient reports she is having surgery on 4/25, she is scheduled for a right total knee replacement per Dr. Alvan Dame.   ? ?Hypertension ?This is a chronic problem. The current episode started more than 1 year ago. The problem has been gradually improving since onset. The problem is controlled. Pertinent negatives include no chest pain or shortness of breath. Risk factors for coronary artery disease include obesity, post-menopausal state and sedentary lifestyle.   ? ?Past Medical History:  ?Diagnosis Date  ? Allergy   ? Arthritis   ? Back pain   ? Glaucoma   ? ROD  ? Hypertension   ? Joint pain   ? Kidney problem   ? Knee pain   ? Obesity   ? Sarcoidosis   ? Sleep apnea   ? SOB (shortness of breath)   ?  ? ?Family History  ?Problem Relation Age of Onset  ? Breast cancer Mother   ? Lung cancer Mother   ? Lung cancer Father   ?     smoked  ? Heart disease Maternal Grandmother   ? Hypertension Brother   ? Liver cancer Brother   ? Pneumonia Brother   ? ? ? ?Current Outpatient Medications:  ?  albuterol (VENTOLIN HFA) 108 (90 Base) MCG/ACT inhaler, INHALE TWO PUFFS BY MOUTH EVERY 6 HOURS AS NEEDED FOR WHEEZING OR SHORTNESS OF BREATH, Disp: 18 g, Rfl: 1 ?  aspirin EC 81 MG tablet, Take 1 tablet (81 mg total) by mouth daily., Disp: , Rfl:  ?  atorvastatin (LIPITOR) 10 MG tablet, TAKE ONE TABLET BY MOUTH DAILY, Disp: 90 tablet, Rfl: 1 ?   CALCIUM PO, Take 1 tablet by mouth daily., Disp: , Rfl:  ?  Cholecalciferol (VITAMIN D3) 5000 UNITS CAPS, Take 5,000 Units by mouth daily., Disp: , Rfl:  ?  Magnesium 400 MG CAPS, Take 400 mg by mouth daily., Disp: , Rfl:  ?  metoprolol succinate (TOPROL-XL) 25 MG 24 hr tablet, TAKE ONE TABLET BY MOUTH DAILY, Disp: 90 tablet, Rfl: 1 ?  Semaglutide,0.25 or 0.5MG/DOS, (OZEMPIC, 0.25 OR 0.5 MG/DOSE,) 2 MG/1.5ML SOPN, DIAL AND INJECT UNDER THE SKIN 0.5 MG WEEKLY, Disp: 1.5 mL, Rfl: 1 ?  telmisartan (MICARDIS) 80 MG tablet, Take 1 tablet (80 mg total) by mouth daily., Disp: 90 tablet, Rfl: 1 ?  Brinzolamide-Brimonidine (SIMBRINZA) 1-0.2 % SUSP, Place 1 drop into both eyes daily., Disp: , Rfl:   ? ?No Known Allergies  ? ?Review of Systems  ?Constitutional: Negative.   ?Respiratory: Negative.  Negative for chest tightness and shortness of breath.   ?Cardiovascular: Negative.  Negative for chest pain.  ?Gastrointestinal: Negative.   ?Neurological: Negative.   ?Psychiatric/Behavioral: Negative.     ? ?Today's Vitals  ? 02/09/22 1033  ?BP: 122/80  ?Pulse: 79  ?Temp: 98.2 ?F (36.8 ?C)  ?Weight: 221 lb 12.8 oz (100.6 kg)  ?Height: _0  (  1.651 m)  ?PainSc: 0-No pain  ? ?Body mass index is 36.91 kg/m?.  ?Wt Readings from Last 3 Encounters:  ?02/09/22 221 lb 12.8 oz (100.6 kg)  ?02/03/22 221 lb (100.2 kg)  ?11/12/21 222 lb (100.7 kg)  ?  ? ?Objective:  ?Physical Exam ?Vitals and nursing note reviewed.  ?Constitutional:   ?   Appearance: Normal appearance.  ?HENT:  ?   Head: Normocephalic and atraumatic.  ?   Nose:  ?   Comments: Masked  ?   Mouth/Throat:  ?   Comments: Masked  ?Eyes:  ?   Extraocular Movements: Extraocular movements intact.  ?Cardiovascular:  ?   Rate and Rhythm: Normal rate and regular rhythm.  ?   Heart sounds: Normal heart sounds.  ?Pulmonary:  ?   Effort: Pulmonary effort is normal.  ?   Breath sounds: Normal breath sounds.  ?Musculoskeletal:  ?   Cervical back: Normal range of motion.  ?Skin: ?   General:  Skin is warm.  ?Neurological:  ?   General: No focal deficit present.  ?   Mental Status: She is alert.  ?Psychiatric:     ?   Mood and Affect: Mood normal.     ?   Behavior: Behavior normal.  ?   ?Assessment And Plan:  ?   ?1. Hypertensive heart disease without heart failure ?Comments: Chronic, well controlled. EKG performed by Cardiology reviewed, no new changes noted. Encouraged to follow low sodium diet.  ? ?2. Coronary artery disease involving native coronary artery of native heart, unspecified whether angina present ?Comments: Chronic, on ASA and statin therapy. ? ?3. Aortic atherosclerosis ([place]) ?Comments: Chronic, she is on both ASA and statin therapy. She is encouraged to follow heart healthy lifestyle.  ? ?4. Class 2 severe obesity due to excess calories with serious comorbidity and body mass index (BMI) of 36.0 to 36.9 in adult [facility] ?Comments: She is encouraged to increase daily activity, aiming for at least 150 minutes of exercise per week. She is advised to strive for BMI<30 to decrease cardiac risk ? ?5. Preoperative evaluation to rule out surgical contraindication ?Comments: I will check labs as below and forward to Dr. Aurea Graff office.  She is medically stable to proceed with TKR w/ acceptable risk.  ?- Hemoglobin A1c ?- Urinalysis, Complete (81001) ?- Protime-INR ? ?6. Immunization due ?- Flu Vaccine QUAD High Dose(Fluad) ?  ?Patient was given opportunity to ask questions. Patient verbalized understanding of the plan and was able to repeat key elements of the plan. All questions were answered to their satisfaction.  ? ?I, [name], MD, have reviewed all documentation for this visit. The documentation on 02/20/22 for the exam, diagnosis, procedures, and orders are all accurate and complete.  ? ?IF YOU HAVE BEEN REFERRED TO A SPECIALIST, IT MAY TAKE 1-2 WEEKS TO SCHEDULE/PROCESS THE REFERRAL. IF YOU HAVE NOT HEARD FROM US/SPECIALIST IN TWO WEEKS, PLEASE GIVE [place] A CALL AT [phone].   ? ?THE PATIENT IS ENCOURAGED TO PRACTICE SOCIAL DISTANCING DUE TO THE COVID-19 PANDEMIC.   ?

## 2022-02-09 NOTE — Patient Instructions (Signed)

## 2022-02-10 LAB — URINALYSIS, COMPLETE
Bilirubin, UA: NEGATIVE
Glucose, UA: NEGATIVE
Ketones, UA: NEGATIVE
Leukocytes,UA: NEGATIVE
Nitrite, UA: NEGATIVE
Protein,UA: NEGATIVE
RBC, UA: NEGATIVE
Specific Gravity, UA: 1.024 (ref 1.005–1.030)
Urobilinogen, Ur: 0.2 mg/dL (ref 0.2–1.0)
pH, UA: 5 (ref 5.0–7.5)

## 2022-02-10 LAB — PROTIME-INR
INR: 1 (ref 0.9–1.2)
Prothrombin Time: 10.7 s (ref 9.1–12.0)

## 2022-02-10 LAB — MICROSCOPIC EXAMINATION
Bacteria, UA: NONE SEEN
Casts: NONE SEEN /lpf
WBC, UA: NONE SEEN /hpf (ref 0–5)

## 2022-02-10 LAB — HEMOGLOBIN A1C
Est. average glucose Bld gHb Est-mCnc: 105 mg/dL
Hgb A1c MFr Bld: 5.3 % (ref 4.8–5.6)

## 2022-02-14 ENCOUNTER — Encounter: Payer: Self-pay | Admitting: Internal Medicine

## 2022-02-22 NOTE — Progress Notes (Signed)
DUE TO COVID-19 ONLY ONE VISITOR IS ALLOWED TO COME WITH YOU AND STAY IN THE WAITING ROOM ONLY DURING PRE OP AND PROCEDURE DAY OF SURGERY.  2 VISITOR  MAY VISIT WITH YOU AFTER SURGERY IN YOUR PRIVATE ROOM DURING VISITING HOURS ONLY! ?YOU MAY HAVE ONE PERSON SPEND THE NITE WITH YOU IN YOUR ROOM AFTER SURGERY.   ? ? Your procedure is scheduled on:  ?    03/09/22  ? Report to [facility] Main  Entrance ? ? Report to admitting at     0600           AM ?DO NOT BRING INSURANCE CARD, PICTURE ID OR WALLET DAY OF SURGERY.  ?  ? ? Call this number if you have problems the morning of surgery [phone]  ? ? REMEMBER: NO  SOLID FOODS , CANDY, GUM OR MINTS AFTER MIDNITE THE NITE BEFORE SURGERY .       Marland Kitchen CLEAR LIQUIDS UNTIL     0540am            DAY OF SURGERY.      PLEASE FINISH ENSURE DRINK PER SURGEON ORDER  WHICH NEEDS TO BE COMPLETED AT     0540 am     MORNING OF SURGERY.   ? ? ? ? ?CLEAR LIQUID DIET ? ? ?Foods Allowed      ?WATER ?BLACK COFFEE ( SUGAR OK, NO MILK, CREAM OR CREAMER) REGULAR AND DECAF  ?TEA ( SUGAR OK NO MILK, CREAM, OR CREAMER) REGULAR AND DECAF  ?PLAIN JELLO ( NO RED)  ?FRUIT ICES ( NO RED, NO FRUIT PULP)  ?POPSICLES ( NO RED)  ?JUICE- APPLE, WHITE GRAPE AND WHITE CRANBERRY  ?SPORT DRINK LIKE GATORADE ( NO RED)  ?CLEAR BROTH ( VEGETABLE , CHICKEN OR BEEF)                                                               ? ?    ? ?BRUSH YOUR TEETH MORNING OF SURGERY AND RINSE YOUR MOUTH OUT, NO CHEWING GUM CANDY OR MINTS. ?  ? ? Take these medicines the morning of surgery with A SIP OF WATER:  inhalers as usual and bring, eye drops as usual, metoprolol  ? ? ?DO NOT TAKE ANY DIABETIC MEDICATIONS DAY OF YOUR SURGERY ?                  ?            You may not have any metal on your body including hair pins and  ?            piercings  Do not wear jewelry, make-up, lotions, powders or perfumes, deodorant ?            Do not wear nail polish on your fingernails.   ?           IF YOU ARE A FEMALE AND WANT  TO SHAVE UNDER ARMS OR LEGS PRIOR TO SURGERY YOU MUST DO SO AT LEAST 48 HOURS PRIOR TO SURGERY.  ?            Men may shave face and neck. ? ? Do not bring valuables to the hospital. [employer] NOT ?  RESPONSIBLE   FOR VALUABLES. ? Contacts, dentures or bridgework may not be worn into surgery. ? Leave suitcase in the car. After surgery it may be brought to your room. ? ?  ? Patients discharged the day of surgery will not be allowed to drive home. IF YOU ARE HAVING SURGERY AND GOING HOME THE SAME DAY, YOU MUST HAVE AN ADULT TO DRIVE YOU HOME AND BE WITH YOU FOR 24 HOURS. YOU MAY GO HOME BY TAXI OR UBER OR ORTHERWISE, BUT AN ADULT MUST ACCOMPANY YOU HOME AND STAY WITH YOU FOR 24 HOURS. ?  ? ?            Please read over the following fact sheets you were given: ?_____________________________________________________________________ ? ?[facility] - Preparing for Surgery ?Before surgery, you can play an important role.  Because skin is not sterile, your skin needs to be as free of germs as possible.  You can reduce the number of germs on your skin by washing with CHG (chlorahexidine gluconate) soap before surgery.  CHG is an antiseptic cleaner which kills germs and bonds with the skin to continue killing germs even after washing. ?Please DO NOT use if you have an allergy to CHG or antibacterial soaps.  If your skin becomes reddened/irritated stop using the CHG and inform your nurse when you arrive at Short Stay. ?Do not shave (including legs and underarms) for at least 48 hours prior to the first CHG shower.  You may shave your face/neck. ?Please follow these instructions carefully: ? 1.  Shower with CHG Soap the night before surgery and the  morning of Surgery. ? 2.  If you choose to wash your hair, wash your hair first as usual with your  normal  shampoo. ? 3.  After you shampoo, rinse your hair and body thoroughly to remove the  shampoo.                           4.  Use CHG as you would any other  liquid soap.  You can apply chg directly  to the skin and wash  ?                     Gently with a scrungie or clean washcloth. ? 5.  Apply the CHG Soap to your body ONLY FROM THE NECK DOWN.   Do not use on face/ open      ?                     Wound or open sores. Avoid contact with eyes, ears mouth and genitals (private parts).  ?                     Production manager,  Genitals (private parts) with your normal soap. ?            6.  Wash thoroughly, paying special attention to the area where your surgery  will be performed. ? 7.  Thoroughly rinse your body with warm water from the neck down. ? 8.  DO NOT shower/wash with your normal soap after using and rinsing off  the CHG Soap. ?               9.  Pat yourself dry with a clean towel. ?           10.  Wear clean pajamas. ?  11.  Place clean sheets on your bed the night of your first shower and do not  sleep with pets. ?Day of Surgery : ?Do not apply any lotions/deodorants the morning of surgery.  Please wear clean clothes to the hospital/surgery center. ? ?FAILURE TO FOLLOW THESE INSTRUCTIONS MAY RESULT IN THE CANCELLATION OF YOUR SURGERY ?PATIENT SIGNATURE_________________________________ ? ?NURSE SIGNATURE__________________________________ ? ?________________________________________________________________________  ? ? ?           ?

## 2022-02-22 NOTE — Progress Notes (Addendum)
Anesthesia Review: ? ?PCP: Glendale Chard- LOV 02/09/22  ?Cardiologist :[facility]- clearance from [facility] on chart dated 02/03/22  LOV 02/03/22  ?Chest x-ray Ct chest- 2021  ?EKG :02/03/22  ?CT cors- 2021  ?Echo : 01/25/21  ?Stress test: ?Cardiac Cath :  ?Activity level: can do a flight of stairs without difficulty  ?Sleep Study/ CPAP : none  ?Fasting Blood Sugar :      / Checks Blood Sugar -- times a day:   ?Blood Thinner/ Instructions /Last Dose: ?ASA / Instructions/ Last Dose :   ?81 mg aspirin ?oZEMPIC IS FOR WEIGHT LOSS  ?Hgba1c- 02/09/22- 5.3  ?pT WAS 25 MINUTES LATE FOR PREOP APPT.  ?Pt reports at preop being a little short of breath.  Sees Dr Alvan Dame on 02/25/22 for preop appt.  Plans to mention to him.   Pt states she may go see pulm MD .  She has not been there in a while   ?

## 2022-02-24 ENCOUNTER — Other Ambulatory Visit: Payer: Self-pay | Admitting: Internal Medicine

## 2022-02-24 ENCOUNTER — Encounter (HOSPITAL_COMMUNITY)
Admission: RE | Admit: 2022-02-24 | Discharge: 2022-02-24 | Disposition: A | Discharge status: Home / Self Care | Payer: BC Managed Care – PPO | Source: Ambulatory Visit | Attending: Orthopedic Surgery | Admitting: Orthopedic Surgery

## 2022-02-24 ENCOUNTER — Other Ambulatory Visit: Payer: Self-pay

## 2022-02-24 ENCOUNTER — Encounter (HOSPITAL_COMMUNITY): Payer: Self-pay

## 2022-02-24 VITALS — BP 154/82 | HR 75 | Temp 98.2°F | Resp 16 | Ht 65.5 in | Wt 218.0 lb

## 2022-02-24 DIAGNOSIS — R011 Cardiac murmur, unspecified: Secondary | ICD-10-CM | POA: Diagnosis not present

## 2022-02-24 DIAGNOSIS — Z01812 Encounter for preprocedural laboratory examination: Secondary | ICD-10-CM | POA: Diagnosis present

## 2022-02-24 DIAGNOSIS — D869 Sarcoidosis, unspecified: Secondary | ICD-10-CM | POA: Diagnosis not present

## 2022-02-24 DIAGNOSIS — M1711 Unilateral primary osteoarthritis, right knee: Secondary | ICD-10-CM | POA: Insufficient documentation

## 2022-02-24 DIAGNOSIS — I10 Essential (primary) hypertension: Secondary | ICD-10-CM | POA: Diagnosis not present

## 2022-02-24 DIAGNOSIS — J45909 Unspecified asthma, uncomplicated: Secondary | ICD-10-CM | POA: Insufficient documentation

## 2022-02-24 DIAGNOSIS — Z01818 Encounter for other preprocedural examination: Secondary | ICD-10-CM

## 2022-02-24 HISTORY — DX: Unspecified asthma, uncomplicated: J45.909

## 2022-02-24 HISTORY — DX: Other complications of anesthesia, initial encounter: T88.59XA

## 2022-02-24 HISTORY — DX: Cardiac murmur, unspecified: R01.1

## 2022-02-24 HISTORY — DX: Dyspnea, unspecified: R06.00

## 2022-02-24 LAB — COMPREHENSIVE METABOLIC PANEL
ALT: 17 U/L (ref 0–44)
AST: 17 U/L (ref 15–41)
Albumin: 4.2 g/dL (ref 3.5–5.0)
Alkaline Phosphatase: 49 U/L (ref 38–126)
Anion gap: 6 (ref 5–15)
BUN: 18 mg/dL (ref 8–23)
CO2: 24 mmol/L (ref 22–32)
Calcium: 9.7 mg/dL (ref 8.9–10.3)
Chloride: 110 mmol/L (ref 98–111)
Creatinine, Ser: 0.7 mg/dL (ref 0.44–1.00)
GFR, Estimated: >60 mL/min (ref >60)
Glucose, Bld: 88 mg/dL (ref 70–99)
Potassium: 4 mmol/L (ref 3.5–5.1)
Sodium: 140 mmol/L (ref 135–145)
Total Bilirubin: 0.3 mg/dL (ref 0.3–1.2)
Total Protein: 7.7 g/dL (ref 6.5–8.1)

## 2022-02-24 LAB — SURGICAL PCR SCREEN
MRSA, PCR: NEGATIVE
Staphylococcus aureus: NEGATIVE

## 2022-02-24 LAB — CBC
HCT: 38.2 % (ref 36.0–46.0)
Hemoglobin: 12 g/dL (ref 12.0–15.0)
MCH: 26.5 pg (ref 26.0–34.0)
MCHC: 31.4 g/dL (ref 30.0–36.0)
MCV: 84.5 fL (ref 80.0–100.0)
Platelets: 243 10*3/uL (ref 150–400)
RBC: 4.52 MIL/uL (ref 3.87–5.11)
RDW: 13 % (ref 11.5–15.5)
WBC: 6.9 10*3/uL (ref 4.0–10.5)
nRBC: 0 % (ref 0.0–0.2)

## 2022-02-25 ENCOUNTER — Ambulatory Visit: Payer: BC Managed Care – PPO | Admitting: Internal Medicine

## 2022-02-25 LAB — TYPE AND SCREEN
ABO/RH(D): B POS
Antibody Screen: NEGATIVE

## 2022-02-26 NOTE — Anesthesia Preprocedure Evaluation (Addendum)
Anesthesia Evaluation  ? ? ?Airway ?Mallampati: II ? ?TM Distance: >3 FB ?Neck ROM: Full ? ? ? Dental ?no notable dental hx. ? ?  ?Pulmonary ?asthma ,  ?  ?Pulmonary exam normal ?breath sounds clear to auscultation ? ? ? ? ? ? Cardiovascular ?hypertension, Pt. on medications ?Normal cardiovascular exam ?Rhythm:Regular Rate:Normal ? ? ?  ?Neuro/Psych ?  ? GI/Hepatic ?GERD  ,  ?Endo/Other  ? ? Renal/GU ?  ? ?  ?Musculoskeletal ? ?(+) Arthritis , Osteoarthritis,   ? Abdominal ?  ?Peds ? Hematology ?  ?Anesthesia Other Findings ? ? Reproductive/Obstetrics ? ?  ? ? ? ? ? ? ? ? ? ? ? ? ? ?  ?  ? ? ? ? ? ? ? ?Anesthesia Physical ?Anesthesia Plan ? ?ASA: 2 ? ?Anesthesia Plan: Spinal and Regional  ? ?Post-op Pain Management: Regional block* and Minimal or no pain anticipated  ? ?Induction: Intravenous ? ?PONV Risk Score and Plan: 2 and Ondansetron, Midazolam and Treatment may vary due to age or medical condition ? ?Airway Management Planned: Simple Face Mask ? ?Additional Equipment:  ? ?Intra-op Plan:  ? ?Post-operative Plan:  ? ?Informed Consent: I have reviewed the patients History and Physical, chart, labs and discussed the procedure including the risks, benefits and alternatives for the proposed anesthesia with the patient or authorized representative who has indicated his/her understanding and acceptance.  ? ? ? ?Dental advisory given ? ?Plan Discussed with: CRNA ? ?Anesthesia Plan Comments: (See APP note by Durel Salts, FNP )  ? ? ? ? ? ?Anesthesia Quick Evaluation ? ?

## 2022-02-26 NOTE — Progress Notes (Signed)
Anesthesia Chart Review: ? ? Case: [id number] Date/Time: 03/09/22 0825  ? Procedure: TOTAL KNEE ARTHROPLASTY (Right: Knee)  ? Anesthesia type: Spinal  ? Pre-op diagnosis: Right knee osteoarthritis  ? Location: [facility] ROOM 10 / [facility]  ? Surgeons: Paralee Cancel, MD  ? ?  ? ? ?DISCUSSION: ?Pt is 71 years old with hx HTN, heart murmur, sarcoidosis, asthma ? ?VS: BP (!) 154/82   Pulse 75   Temp 36.8 ?C (Oral)   Resp 16   Ht 5' 5.5" (1.664 m)   Wt 98.9 kg   SpO2 99%   BMI 35.73 kg/m?  ? ?PROVIDERS: ?- PCP is Glendale Chard, MD ?- Cardiologist is Adrian Prows, MD. Pt cleared for surgery at low risk at last office visit 02/03/22 with Lawerance Cruel, PA ?- Pulmonologist is Baltazar Apo, MD. Last office visit 08/04/20. F/u in 2 years recommended.  ? ? ?LABS: Labs reviewed: Acceptable for surgery. ?(all labs ordered are listed, but only abnormal results are displayed) ? ?Labs Reviewed  ?SURGICAL PCR SCREEN  ?CBC  ?COMPREHENSIVE METABOLIC PANEL  ?TYPE AND SCREEN  ? ? ? ?IMAGES: ?CT chest hi res 06/20/20:  ?1. Moderate bilateral hilar and mediastinal lymphadenopathy, faintly calcified in subcarinal chain, nonspecific but compatible with suspected sarcoidosis. ?2. Scattered perilymphatic distribution solid pulmonary nodules, largest 1.3 cm in the peribronchovascular right upper lobe, probably due to sarcoidosis. Suggest attention on follow-up high-resolution chest CT study in 6 months given the absence of prior comparison chest CT study. ?3. No evidence of fibrotic interstitial lung disease. ?4. Moderate patchy air trapping in both lungs, indicative of small airways disease, commonly seen with sarcoidosis. ?5. One vessel coronary atherosclerosis. ?6. Aortic Atherosclerosis ? ? ?EKG 02/03/22: Sinus rhythm with borderline first-degree AV block at a rate of 84 bpm.  Left axis, left anterior fascicular block.  LVH with secondary ST-T wave abnormalities, cannot exclude anterolateral ischemia.  Unchanged compared to EKG 12/26/2019,  01/13/2021. ? ? ?CV: ?Echo 01/22/21:  ?- Normal LV systolic function with visual EF 55-60%. Left ventricle cavity is normal in size. The left ventricle is normal thickness, septal bulge.  ?- Normal global wall motion. Unable to evaluate diastolic function due to severity of mitral annular  calcification. Elevated LAP.  ?- Severe calcification of the mitral valve annulus. Native mitral valve with trace regurgitation. No evidence of mitral valve stenosis.  ?- Compared to prior study dated 12/07/2019 no significant change.  ? ?CT coronary morphology 01/25/20:  ?1. Coronary calcium score of 201. This was 89th percentile for age and sex matched control. ?2. Normal coronary origin with right dominance. ?3.  Mild (25-49%) calcified plaque in a small D1. ?4.  Moderate (50-69%) mixed plaque in the mid LCX and OM2. ?5.  Will send study for FFRct. ?6.  Moderate mitral annular calcification. ?  ?CT coronary FFR 01/26/20:  ?1.  FFRct findings consistent with non-obstructive coronary disease ?2. Recommend aggressive risk factor modification including LDL goal <70. ? ?Cardiac cath 09/12/14:  ?- Mild coronary artery disease with smooth 40% near ostial narrowing of the left anterior descending artery and otherwise normal coronary arteries. ?- Normal LV function. ? ? ?Past Medical History:  ?Diagnosis Date  ? Allergy   ? Arthritis   ? Asthma   ? Back pain   ? Complication of anesthesia   ? slow to wake up  ? Dyspnea   ? Glaucoma   ? ROD  ? Heart murmur   ? Hypertension   ? Joint pain   ?  Knee pain   ? Obesity   ? Sarcoidosis   ? SOB (shortness of breath)   ? ? ?Past Surgical History:  ?Procedure Laterality Date  ? ABDOMINAL HYSTERECTOMY    ? BUNIONECTOMY    ? left  ? CHOLECYSTECTOMY    ? KNEE SURGERY    ? right  ? LEFT HEART CATHETERIZATION WITH CORONARY ANGIOGRAM N/A 09/12/2014  ? Procedure: LEFT HEART CATHETERIZATION WITH CORONARY ANGIOGRAM;  Surgeon: Troy Sine, MD;  Location: [facility] CATH LAB;  Service: Cardiovascular;  Laterality: N/A;   ? ? ?MEDICATIONS: ? albuterol (VENTOLIN HFA) 108 (90 Base) MCG/ACT inhaler  ? aspirin EC 81 MG tablet  ? atorvastatin (LIPITOR) 10 MG tablet  ? Brinzolamide-Brimonidine (SIMBRINZA) 1-0.2 % SUSP  ? CALCIUM PO  ? Cholecalciferol (VITAMIN D3) 5000 UNITS CAPS  ? Magnesium 400 MG CAPS  ? metoprolol succinate (TOPROL-XL) 25 MG 24 hr tablet  ? OZEMPIC, 0.25 OR 0.5 MG/DOSE, 2 MG/1.5ML SOPN  ? telmisartan (MICARDIS) 80 MG tablet  ? ?No current facility-administered medications for this encounter.  ? ? ?If no changes, I anticipate pt can proceed with surgery as scheduled.  ? ?Willeen Cass, PhD, FNP-BC ?[facility]/Anesthesiology ?Phone: [phone] ?02/26/2022 11:22 AM ? ? ? ? ? ? ?

## 2022-03-03 ENCOUNTER — Other Ambulatory Visit: Payer: Self-pay | Admitting: Internal Medicine

## 2022-03-09 ENCOUNTER — Ambulatory Visit (HOSPITAL_COMMUNITY): Payer: BC Managed Care – PPO | Admitting: Certified Registered"

## 2022-03-09 ENCOUNTER — Encounter (HOSPITAL_COMMUNITY): Payer: Self-pay | Admitting: Orthopedic Surgery

## 2022-03-09 ENCOUNTER — Observation Stay (HOSPITAL_COMMUNITY)
Admission: RE | Admit: 2022-03-09 | Discharge: 2022-03-10 | Disposition: A | Discharge status: Home / Self Care | Payer: BC Managed Care – PPO | Attending: Orthopedic Surgery | Admitting: Orthopedic Surgery

## 2022-03-09 ENCOUNTER — Other Ambulatory Visit: Payer: Self-pay

## 2022-03-09 ENCOUNTER — Ambulatory Visit (HOSPITAL_COMMUNITY): Payer: BC Managed Care – PPO | Admitting: Emergency Medicine

## 2022-03-09 ENCOUNTER — Encounter (HOSPITAL_COMMUNITY)
Admission: RE | Disposition: A | Discharge status: Home / Self Care | Payer: Self-pay | Source: Home / Self Care | Attending: Orthopedic Surgery

## 2022-03-09 DIAGNOSIS — I10 Essential (primary) hypertension: Secondary | ICD-10-CM | POA: Insufficient documentation

## 2022-03-09 DIAGNOSIS — M1711 Unilateral primary osteoarthritis, right knee: Secondary | ICD-10-CM | POA: Diagnosis not present

## 2022-03-09 DIAGNOSIS — Z96651 Presence of right artificial knee joint: Secondary | ICD-10-CM

## 2022-03-09 DIAGNOSIS — J45909 Unspecified asthma, uncomplicated: Secondary | ICD-10-CM | POA: Insufficient documentation

## 2022-03-09 HISTORY — PX: TOTAL KNEE ARTHROPLASTY: SHX125

## 2022-03-09 LAB — ABO/RH: ABO/RH(D): B POS

## 2022-03-09 SURGERY — ARTHROPLASTY, KNEE, TOTAL
Anesthesia: Regional | Site: Knee | Laterality: Right

## 2022-03-09 MED ORDER — ALBUTEROL SULFATE (2.5 MG/3ML) 0.083% IN NEBU: 2.5000 mg | INHALATION_SOLUTION | Freq: Four times a day (QID) | RESPIRATORY_TRACT | Status: DC | PRN

## 2022-03-09 MED ORDER — BUPIVACAINE IN DEXTROSE 0.75-8.25 % IT SOLN
INTRATHECAL | Status: DC | PRN
  Administered 2022-03-09: 1.8 mL via INTRATHECAL

## 2022-03-09 MED ORDER — METHOCARBAMOL 500 MG PO TABS
500.0000 mg | ORAL_TABLET | Freq: Four times a day (QID) | ORAL | Status: DC | PRN
  Administered 2022-03-09: 500 mg via ORAL
  Filled 2022-03-09: qty 1

## 2022-03-09 MED ORDER — LACTATED RINGERS IV SOLN: INTRAVENOUS | Status: DC

## 2022-03-09 MED ORDER — SODIUM CHLORIDE (PF) 0.9 % IJ SOLN
INTRAMUSCULAR | Status: DC | PRN
Start: 2022-03-09 — End: 2022-03-09
  Administered 2022-03-09: 30 mL

## 2022-03-09 MED ORDER — ALBUTEROL SULFATE HFA 108 (90 BASE) MCG/ACT IN AERS: 2.0000 | INHALATION_SPRAY | Freq: Four times a day (QID) | RESPIRATORY_TRACT | Status: DC | PRN

## 2022-03-09 MED ORDER — STERILE WATER FOR IRRIGATION IR SOLN
Status: DC | PRN
  Administered 2022-03-09: 2000 mL

## 2022-03-09 MED ORDER — DEXAMETHASONE SODIUM PHOSPHATE 10 MG/ML IJ SOLN
10.0000 mg | Freq: Once | INTRAMUSCULAR | Status: AC
  Administered 2022-03-10: 10 mg via INTRAVENOUS
  Filled 2022-03-09: qty 1

## 2022-03-09 MED ORDER — CELECOXIB 200 MG PO CAPS
200.0000 mg | ORAL_CAPSULE | Freq: Every day | ORAL | Status: DC
  Administered 2022-03-09 – 2022-03-10 (×2): 200 mg via ORAL
  Filled 2022-03-09 (×2): qty 1

## 2022-03-09 MED ORDER — MENTHOL 3 MG MT LOZG: 1.0000 | LOZENGE | OROMUCOSAL | Status: DC | PRN

## 2022-03-09 MED ORDER — METHOCARBAMOL 500 MG IVPB - SIMPLE MED
500.0000 mg | Freq: Four times a day (QID) | INTRAVENOUS | Status: DC | PRN
  Filled 2022-03-09: qty 50

## 2022-03-09 MED ORDER — AMISULPRIDE (ANTIEMETIC) 5 MG/2ML IV SOLN: 10.0000 mg | Freq: Once | INTRAVENOUS | Status: DC | PRN

## 2022-03-09 MED ORDER — CEFAZOLIN SODIUM-DEXTROSE 2-4 GM/100ML-% IV SOLN
2.0000 g | INTRAVENOUS | Status: AC
  Administered 2022-03-09: 2 g via INTRAVENOUS
  Filled 2022-03-09: qty 100

## 2022-03-09 MED ORDER — ONDANSETRON HCL 4 MG/2ML IJ SOLN
INTRAMUSCULAR | Status: DC | PRN
Start: 2022-03-09 — End: 2022-03-09
  Administered 2022-03-09: 4 mg via INTRAVENOUS

## 2022-03-09 MED ORDER — OXYCODONE HCL 5 MG PO TABS: 5.0000 mg | ORAL_TABLET | Freq: Once | ORAL | Status: DC | PRN

## 2022-03-09 MED ORDER — DIPHENHYDRAMINE HCL 12.5 MG/5ML PO ELIX: 12.5000 mg | ORAL_SOLUTION | ORAL | Status: DC | PRN

## 2022-03-09 MED ORDER — PHENOL 1.4 % MT LIQD
1.0000 | OROMUCOSAL | Status: DC | PRN
  Administered 2022-03-09: 1 via OROMUCOSAL
  Filled 2022-03-09: qty 177

## 2022-03-09 MED ORDER — BRINZOLAMIDE 1 % OP SUSP
1.0000 [drp] | Freq: Every day | OPHTHALMIC | Status: DC
Start: 2022-03-10 — End: 2022-03-10
  Filled 2022-03-09: qty 10

## 2022-03-09 MED ORDER — BUPIVACAINE-EPINEPHRINE (PF) 0.25% -1:200000 IJ SOLN
INTRAMUSCULAR | Status: DC | PRN
  Administered 2022-03-09: 30 mL

## 2022-03-09 MED ORDER — SODIUM CHLORIDE 0.9 % IR SOLN
Status: DC | PRN
  Administered 2022-03-09: 3000 mL

## 2022-03-09 MED ORDER — VANCOMYCIN HCL 1000 MG IV SOLR
INTRAVENOUS | Status: AC
  Filled 2022-03-09: qty 20

## 2022-03-09 MED ORDER — BISACODYL 10 MG RE SUPP: 10.0000 mg | Freq: Every day | RECTAL | Status: DC | PRN

## 2022-03-09 MED ORDER — ACETAMINOPHEN 325 MG PO TABS: 325.0000 mg | ORAL_TABLET | Freq: Four times a day (QID) | ORAL | Status: DC | PRN

## 2022-03-09 MED ORDER — METOCLOPRAMIDE HCL 5 MG/ML IJ SOLN: 5.0000 mg | Freq: Three times a day (TID) | INTRAMUSCULAR | Status: DC | PRN

## 2022-03-09 MED ORDER — PHENYLEPHRINE HCL-NACL 20-0.9 MG/250ML-% IV SOLN
INTRAVENOUS | Status: DC | PRN
Start: 2022-03-09 — End: 2022-03-09
  Administered 2022-03-09: 40 ug/min via INTRAVENOUS

## 2022-03-09 MED ORDER — OXYCODONE HCL 5 MG PO TABS
10.0000 mg | ORAL_TABLET | ORAL | Status: DC | PRN
  Administered 2022-03-10: 10 mg via ORAL

## 2022-03-09 MED ORDER — KETOROLAC TROMETHAMINE 30 MG/ML IJ SOLN
INTRAMUSCULAR | Status: AC
  Filled 2022-03-09: qty 1

## 2022-03-09 MED ORDER — METOCLOPRAMIDE HCL 5 MG PO TABS: 5.0000 mg | ORAL_TABLET | Freq: Three times a day (TID) | ORAL | Status: DC | PRN

## 2022-03-09 MED ORDER — TRANEXAMIC ACID-NACL 1000-0.7 MG/100ML-% IV SOLN
1000.0000 mg | Freq: Once | INTRAVENOUS | Status: AC
  Administered 2022-03-09: 1000 mg via INTRAVENOUS
  Filled 2022-03-09: qty 100

## 2022-03-09 MED ORDER — IRBESARTAN 150 MG PO TABS
300.0000 mg | ORAL_TABLET | Freq: Every day | ORAL | Status: DC
Start: 2022-03-10 — End: 2022-03-10
  Administered 2022-03-10: 300 mg via ORAL
  Filled 2022-03-09: qty 2

## 2022-03-09 MED ORDER — HYDROMORPHONE HCL 1 MG/ML IJ SOLN
INTRAMUSCULAR | Status: AC
  Filled 2022-03-09: qty 1

## 2022-03-09 MED ORDER — ATORVASTATIN CALCIUM 10 MG PO TABS
10.0000 mg | ORAL_TABLET | Freq: Every day | ORAL | Status: DC
  Administered 2022-03-09: 10 mg via ORAL
  Filled 2022-03-09: qty 1

## 2022-03-09 MED ORDER — HYDROMORPHONE HCL 1 MG/ML IJ SOLN
0.5000 mg | INTRAMUSCULAR | Status: DC | PRN
  Administered 2022-03-09: 1 mg via INTRAVENOUS
  Filled 2022-03-09: qty 1

## 2022-03-09 MED ORDER — PROMETHAZINE HCL 25 MG/ML IJ SOLN: 6.2500 mg | INTRAMUSCULAR | Status: DC | PRN

## 2022-03-09 MED ORDER — DEXAMETHASONE SODIUM PHOSPHATE 10 MG/ML IJ SOLN
INTRAMUSCULAR | Status: AC
  Filled 2022-03-09: qty 1

## 2022-03-09 MED ORDER — MEPERIDINE HCL 50 MG/ML IJ SOLN: 6.2500 mg | INTRAMUSCULAR | Status: DC | PRN

## 2022-03-09 MED ORDER — DEXAMETHASONE SODIUM PHOSPHATE 10 MG/ML IJ SOLN
8.0000 mg | Freq: Once | INTRAMUSCULAR | Status: AC
  Administered 2022-03-09: 8 mg via INTRAVENOUS

## 2022-03-09 MED ORDER — AMISULPRIDE (ANTIEMETIC) 5 MG/2ML IV SOLN
INTRAVENOUS | Status: AC
  Filled 2022-03-09: qty 4

## 2022-03-09 MED ORDER — TOBRAMYCIN SULFATE 1.2 G IJ SOLR
INTRAMUSCULAR | Status: AC
  Filled 2022-03-09: qty 1.2

## 2022-03-09 MED ORDER — PROPOFOL 1000 MG/100ML IV EMUL
INTRAVENOUS | Status: AC
  Filled 2022-03-09: qty 100

## 2022-03-09 MED ORDER — ORAL CARE MOUTH RINSE: 15.0000 mL | Freq: Once | OROMUCOSAL | Status: AC

## 2022-03-09 MED ORDER — OXYCODONE HCL 5 MG/5ML PO SOLN: 5.0000 mg | Freq: Once | ORAL | Status: DC | PRN

## 2022-03-09 MED ORDER — MIDAZOLAM HCL 2 MG/2ML IJ SOLN
1.0000 mg | INTRAMUSCULAR | Status: DC
  Administered 2022-03-09: 2 mg via INTRAVENOUS
  Filled 2022-03-09: qty 2

## 2022-03-09 MED ORDER — POVIDONE-IODINE 10 % EX SWAB
2.0000 "application " | Freq: Once | CUTANEOUS | Status: AC
  Administered 2022-03-09: 2 via TOPICAL

## 2022-03-09 MED ORDER — BRIMONIDINE TARTRATE 0.2 % OP SOLN
1.0000 [drp] | Freq: Every day | OPHTHALMIC | Status: DC
Start: 2022-03-10 — End: 2022-03-10
  Filled 2022-03-09: qty 5

## 2022-03-09 MED ORDER — ONDANSETRON HCL 4 MG PO TABS: 4.0000 mg | ORAL_TABLET | Freq: Four times a day (QID) | ORAL | Status: DC | PRN

## 2022-03-09 MED ORDER — HYDROMORPHONE HCL 1 MG/ML IJ SOLN: 0.2500 mg | INTRAMUSCULAR | Status: DC | PRN

## 2022-03-09 MED ORDER — OXYCODONE HCL 5 MG PO TABS
ORAL_TABLET | ORAL | Status: AC
  Filled 2022-03-09: qty 1

## 2022-03-09 MED ORDER — ONDANSETRON HCL 4 MG/2ML IJ SOLN
INTRAMUSCULAR | Status: AC
  Filled 2022-03-09: qty 2

## 2022-03-09 MED ORDER — ONDANSETRON HCL 4 MG/2ML IJ SOLN: 4.0000 mg | Freq: Four times a day (QID) | INTRAMUSCULAR | Status: DC | PRN

## 2022-03-09 MED ORDER — PROPOFOL 10 MG/ML IV BOLUS
INTRAVENOUS | Status: DC | PRN
  Administered 2022-03-09: 20 mg via INTRAVENOUS

## 2022-03-09 MED ORDER — PROPOFOL 500 MG/50ML IV EMUL
INTRAVENOUS | Status: DC | PRN
  Administered 2022-03-09: 100 ug/kg/min via INTRAVENOUS

## 2022-03-09 MED ORDER — KETOROLAC TROMETHAMINE 30 MG/ML IJ SOLN
INTRAMUSCULAR | Status: DC | PRN
  Administered 2022-03-09: 30 mg

## 2022-03-09 MED ORDER — ROPIVACAINE HCL 5 MG/ML IJ SOLN
INTRAMUSCULAR | Status: DC | PRN
  Administered 2022-03-09: 20 mL via PERINEURAL

## 2022-03-09 MED ORDER — 0.9 % SODIUM CHLORIDE (POUR BTL) OPTIME
TOPICAL | Status: DC | PRN
  Administered 2022-03-09: 1000 mL

## 2022-03-09 MED ORDER — CHLORHEXIDINE GLUCONATE 0.12 % MT SOLN
15.0000 mL | Freq: Once | OROMUCOSAL | Status: AC
  Administered 2022-03-09: 15 mL via OROMUCOSAL

## 2022-03-09 MED ORDER — FENTANYL CITRATE PF 50 MCG/ML IJ SOSY
50.0000 ug | PREFILLED_SYRINGE | INTRAMUSCULAR | Status: DC
  Administered 2022-03-09: 50 ug via INTRAVENOUS
  Filled 2022-03-09: qty 2

## 2022-03-09 MED ORDER — DOCUSATE SODIUM 100 MG PO CAPS
100.0000 mg | ORAL_CAPSULE | Freq: Two times a day (BID) | ORAL | Status: DC
  Administered 2022-03-09 – 2022-03-10 (×2): 100 mg via ORAL
  Filled 2022-03-09 (×2): qty 1

## 2022-03-09 MED ORDER — SODIUM CHLORIDE (PF) 0.9 % IJ SOLN
INTRAMUSCULAR | Status: AC
  Filled 2022-03-09: qty 30

## 2022-03-09 MED ORDER — SODIUM CHLORIDE 0.9 % IV SOLN: INTRAVENOUS | Status: DC

## 2022-03-09 MED ORDER — FERROUS SULFATE 325 (65 FE) MG PO TABS
325.0000 mg | ORAL_TABLET | Freq: Three times a day (TID) | ORAL | Status: DC
  Administered 2022-03-09 – 2022-03-10 (×3): 325 mg via ORAL
  Filled 2022-03-09 (×3): qty 1

## 2022-03-09 MED ORDER — OXYCODONE HCL 5 MG PO TABS
5.0000 mg | ORAL_TABLET | ORAL | Status: DC | PRN
  Administered 2022-03-09 – 2022-03-10 (×2): 10 mg via ORAL
  Filled 2022-03-09 (×4): qty 2

## 2022-03-09 MED ORDER — TRANEXAMIC ACID-NACL 1000-0.7 MG/100ML-% IV SOLN
1000.0000 mg | INTRAVENOUS | Status: AC
  Administered 2022-03-09: 1000 mg via INTRAVENOUS
  Filled 2022-03-09: qty 100

## 2022-03-09 MED ORDER — POLYETHYLENE GLYCOL 3350 17 G PO PACK: 17.0000 g | PACK | Freq: Every day | ORAL | Status: DC | PRN

## 2022-03-09 MED ORDER — CEFAZOLIN SODIUM-DEXTROSE 2-4 GM/100ML-% IV SOLN
2.0000 g | Freq: Four times a day (QID) | INTRAVENOUS | Status: AC
  Administered 2022-03-09 (×2): 2 g via INTRAVENOUS
  Filled 2022-03-09 (×2): qty 100

## 2022-03-09 MED ORDER — ASPIRIN 81 MG PO CHEW
81.0000 mg | CHEWABLE_TABLET | Freq: Two times a day (BID) | ORAL | Status: DC
  Administered 2022-03-09 – 2022-03-10 (×2): 81 mg via ORAL
  Filled 2022-03-09 (×2): qty 1

## 2022-03-09 MED ORDER — BUPIVACAINE-EPINEPHRINE (PF) 0.25% -1:200000 IJ SOLN
INTRAMUSCULAR | Status: AC
  Filled 2022-03-09: qty 30

## 2022-03-09 MED ORDER — METOPROLOL SUCCINATE ER 25 MG PO TB24
25.0000 mg | ORAL_TABLET | Freq: Every day | ORAL | Status: DC
  Administered 2022-03-10: 25 mg via ORAL
  Filled 2022-03-09: qty 1

## 2022-03-09 SURGICAL SUPPLY — 68 items
ADH SKN CLS APL DERMABOND .7 (GAUZE/BANDAGES/DRESSINGS) ×1
ATTUNE MED ANAT PAT 35 KNEE (Knees) ×1 IMPLANT
ATTUNE PS FEM RT SZ 3 CEM KNEE (Femur) ×1 IMPLANT
ATTUNE PSRP INSR SZ3 6 KNEE (Insert) ×1 IMPLANT
BAG COUNTER SPONGE SURGICOUNT (BAG) IMPLANT
BAG SPEC THK2 15X12 ZIP CLS (MISCELLANEOUS)
BAG SPNG CNTER NS LX DISP (BAG)
BAG ZIPLOCK 12X15 (MISCELLANEOUS) IMPLANT
BASEPLATE TIBIAL ROTATING SZ 4 (Knees) ×1 IMPLANT
BLADE SAW SGTL 11.0X1.19X90.0M (BLADE) ×1 IMPLANT
BLADE SAW SGTL 13.0X1.19X90.0M (BLADE) ×2 IMPLANT
BNDG CMPR MED 10X6 ELC LF (GAUZE/BANDAGES/DRESSINGS) ×1
BNDG ELASTIC 6X10 VLCR STRL LF (GAUZE/BANDAGES/DRESSINGS) ×1 IMPLANT
BNDG ELASTIC 6X5.8 VLCR STR LF (GAUZE/BANDAGES/DRESSINGS) ×2 IMPLANT
BOWL SMART MIX CTS (DISPOSABLE) ×2 IMPLANT
BSPLAT TIB 4 CMNT ROT PLAT STR (Knees) ×1 IMPLANT
CEMENT HV SMART SET (Cement) ×2 IMPLANT
CUFF TOURN SGL QUICK 34 (TOURNIQUET CUFF) ×2
CUFF TRNQT CYL 34X4.125X (TOURNIQUET CUFF) ×1 IMPLANT
DERMABOND ADVANCED (GAUZE/BANDAGES/DRESSINGS) ×1
DERMABOND ADVANCED .7 DNX12 (GAUZE/BANDAGES/DRESSINGS) ×1 IMPLANT
DRAPE SHEET LG 3/4 BI-LAMINATE (DRAPES) ×2 IMPLANT
DRAPE U-SHAPE 47X51 STRL (DRAPES) ×2 IMPLANT
DRESSING AQUACEL AG SP 3.5X10 (GAUZE/BANDAGES/DRESSINGS) ×1 IMPLANT
DRSG AQUACEL AG SP 3.5X10 (GAUZE/BANDAGES/DRESSINGS) ×2
DURAPREP 26ML APPLICATOR (WOUND CARE) ×4 IMPLANT
ELECT REM PT RETURN 15FT ADLT (MISCELLANEOUS) ×2 IMPLANT
FACESHIELD WRAPAROUND (MASK) ×4 IMPLANT
FACESHIELD WRAPAROUND OR TEAM (MASK) ×2 IMPLANT
GLOVE BIO SURGEON STRL SZ 6 (GLOVE) ×2 IMPLANT
GLOVE BIO SURGEON STRL SZ 6.5 (GLOVE) ×1 IMPLANT
GLOVE BIO SURGEON STRL SZ7 (GLOVE) ×1 IMPLANT
GLOVE BIO SURGEON STRL SZ7.5 (GLOVE) ×2 IMPLANT
GLOVE BIOGEL PI IND STRL 6.5 (GLOVE) ×1 IMPLANT
GLOVE BIOGEL PI IND STRL 7.0 (GLOVE) IMPLANT
GLOVE BIOGEL PI IND STRL 7.5 (GLOVE) ×1 IMPLANT
GLOVE BIOGEL PI INDICATOR 6.5 (GLOVE) ×1
GLOVE BIOGEL PI INDICATOR 7.0 (GLOVE) ×4
GLOVE BIOGEL PI INDICATOR 7.5 (GLOVE) ×1
GOWN STRL REUS W/ TWL LRG LVL3 (GOWN DISPOSABLE) ×2 IMPLANT
GOWN STRL REUS W/ TWL XL LVL3 (GOWN DISPOSABLE) IMPLANT
GOWN STRL REUS W/TWL LRG LVL3 (GOWN DISPOSABLE) ×4
GOWN STRL REUS W/TWL XL LVL3 (GOWN DISPOSABLE) ×4
HANDPIECE INTERPULSE COAX TIP (DISPOSABLE) ×2
HOLDER FOLEY CATH W/STRAP (MISCELLANEOUS) ×1 IMPLANT
IV NS IRRIG 3000ML ARTHROMATIC (IV SOLUTION) ×1 IMPLANT
KIT TURNOVER KIT A (KITS) ×1 IMPLANT
MANIFOLD NEPTUNE II (INSTRUMENTS) ×2 IMPLANT
NDL SAFETY ECLIPSE 18X1.5 (NEEDLE) IMPLANT
NEEDLE HYPO 18GX1.5 SHARP (NEEDLE) ×2
NS IRRIG 1000ML POUR BTL (IV SOLUTION) ×2 IMPLANT
PACK TOTAL KNEE CUSTOM (KITS) ×2 IMPLANT
PROTECTOR NERVE ULNAR (MISCELLANEOUS) ×2 IMPLANT
SET HNDPC FAN SPRY TIP SCT (DISPOSABLE) ×1 IMPLANT
SET PAD KNEE POSITIONER (MISCELLANEOUS) ×2 IMPLANT
SPIKE FLUID TRANSFER (MISCELLANEOUS) ×6 IMPLANT
SUT MNCRL AB 4-0 PS2 18 (SUTURE) ×2 IMPLANT
SUT STRATAFIX PDS+ 0 24IN (SUTURE) ×2 IMPLANT
SUT VIC AB 1 CT1 36 (SUTURE) ×2 IMPLANT
SUT VIC AB 2-0 CT1 27 (SUTURE) ×4
SUT VIC AB 2-0 CT1 TAPERPNT 27 (SUTURE) ×2 IMPLANT
SYR 3ML LL SCALE MARK (SYRINGE) ×2 IMPLANT
TOWEL GREEN STERILE FF (TOWEL DISPOSABLE) ×2 IMPLANT
TRAY CATH INTERMITTENT SS 16FR (CATHETERS) ×1 IMPLANT
TRAY FOLEY MTR SLVR 16FR STAT (SET/KITS/TRAYS/PACK) ×1 IMPLANT
TUBE SUCTION HIGH CAP CLEAR NV (SUCTIONS) ×2 IMPLANT
WATER STERILE IRR 1000ML POUR (IV SOLUTION) ×4 IMPLANT
WRAP KNEE MAXI GEL POST OP (GAUZE/BANDAGES/DRESSINGS) ×2 IMPLANT

## 2022-03-09 NOTE — Anesthesia Procedure Notes (Signed)
Procedure Name: [place] ?Date/Time: 03/09/2022 8:49 AM ?Performed by: Claudia Desanctis, CRNA ?Pre-anesthesia Checklist: Patient identified, Emergency Drugs available, Suction available and Patient being monitored ?Patient Re-evaluated:Patient Re-evaluated prior to induction ?Oxygen Delivery Method: Simple face mask ? ? ? ? ?

## 2022-03-09 NOTE — Anesthesia Procedure Notes (Signed)
Spinal ? ?Patient location during procedure: OR ?Start time: 03/09/2022 8:39 AM ?End time: 03/09/2022 8:43 AM ?Reason for block: surgical anesthesia ?Staffing ?Performed: resident/CRNA  ?Anesthesiologist: Lynda Rainwater, MD ?Resident/CRNA: Claudia Desanctis, CRNA ?Preanesthetic Checklist ?Completed: patient identified, IV checked, site marked, risks and benefits discussed, surgical consent, monitors and equipment checked, pre-op evaluation and timeout performed ?Spinal Block ?Patient position: sitting ?Prep: DuraPrep ?Patient monitoring: heart rate, cardiac monitor, continuous pulse ox and blood pressure ?Approach: midline ?Location: L3-4 ?Injection technique: single-shot ?Needle ?Needle type: Pencan  ?Needle gauge: 24 G ?Needle length: 10 cm ?Needle insertion depth: 8 cm ?Assessment ?Sensory level: T4 ?Events: CSF return ? ? ? ?

## 2022-03-09 NOTE — Anesthesia Postprocedure Evaluation (Signed)
Anesthesia Post Note ? ?Patient: Kristine [name] ? ?Procedure(s) Performed: TOTAL KNEE ARTHROPLASTY (Right: Knee) ? ?  ? ?Patient location during evaluation: PACU ?Anesthesia Type: Regional and Spinal ?Level of consciousness: awake and alert ?Pain management: pain level controlled ?Vital Signs Assessment: post-procedure vital signs reviewed and stable ?Respiratory status: spontaneous breathing, nonlabored ventilation and respiratory function stable ?Cardiovascular status: blood pressure returned to baseline and stable ?Postop Assessment: no apparent nausea or vomiting ?Anesthetic complications: no ? ? ?No notable events documented. ? ?Last Vitals:  ?Vitals:  ? 03/09/22 1145 03/09/22 1200  ?BP: 139/85 (!) 151/84  ?Pulse:  70  ?Resp: 10 14  ?Temp: 36.4 ?C   ?SpO2: 94% 93%  ?  ?Last Pain:  ?Vitals:  ? 03/09/22 1200  ?TempSrc:   ?PainSc: 0-No pain  ? ? ?  ?  ?  ?  ?  ?  ? ?Lynda Rainwater ? ? ? ? ?

## 2022-03-09 NOTE — Discharge Instructions (Signed)

## 2022-03-09 NOTE — H&P (Signed)
TOTAL KNEE ADMISSION H&P ? ?Patient is being admitted for right total knee arthroplasty. ? ?Subjective: ? ?Chief Complaint:right knee pain. ? ?HPI: Kristine [name], 71 y.o. female, has a history of pain and functional disability in the right knee due to arthritis and has failed non-surgical conservative treatments for greater than 12 weeks to includeNSAID's and/or analgesics, corticosteriod injections, and activity modification.  Onset of symptoms was gradual, starting 2 years ago with gradually worsening course since that time. The patient noted no past surgery on the right knee(s).  Patient currently rates pain in the right knee(s) at 8 out of 10 with activity. Patient has worsening of pain with activity and weight bearing, pain that interferes with activities of daily living, and pain with passive range of motion.  Patient has evidence of joint space narrowing by imaging studies.  There is no active infection. ? ?Patient Active Problem List  ? Diagnosis Date Noted  ? Ischemic chest pain ([place]) 07/10/2019  ? Hyperlipemia 07/24/2018  ? Simple chronic bronchitis ([place]) 12/08/2017  ? Upper airway cough syndrome 12/23/2016  ? Chest pain, atypical 08/21/2014  ? DOE (dyspnea on exertion) 08/21/2014  ? Vitamin D deficiency 05/13/2013  ? Spinal stenosis of lumbar region 05/13/2013  ? Elevated LDL cholesterol level 05/13/2013  ? Metabolic syndrome 58/52/7782  ? Impaired glucose tolerance 04/14/2013  ? 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 Morbid (severe) obesity due to excess calories ([place]) 11/11/2011  ? Sarcoidosis ([place]) 09/30/2011  ? Essential hypertension 09/30/2011  ? Osteoarthritis 09/30/2011  ? GERD (gastroesophageal reflux disease) 09/30/2011  ? Asthma 09/30/2011  ? Goiter 09/30/2011  ? ?Past Medical History:  ?Diagnosis Date  ? Allergy   ? Arthritis   ? Asthma   ? Back pain   ? Complication of anesthesia   ? slow to wake up  ? Dyspnea   ? Glaucoma   ? ROD  ? Heart murmur   ? Hypertension   ? Joint pain   ? Knee  pain   ? Obesity   ? Sarcoidosis   ? SOB (shortness of breath)   ?  ?Past Surgical History:  ?Procedure Laterality Date  ? ABDOMINAL HYSTERECTOMY    ? BUNIONECTOMY    ? left  ? CHOLECYSTECTOMY    ? KNEE SURGERY    ? right  ? LEFT HEART CATHETERIZATION WITH CORONARY ANGIOGRAM N/A 09/12/2014  ? Procedure: LEFT HEART CATHETERIZATION WITH CORONARY ANGIOGRAM;  Surgeon: Troy Sine, MD;  Location: [facility] CATH LAB;  Service: Cardiovascular;  Laterality: N/A;  ?  ?No current facility-administered medications for this encounter.  ? ?No Known Allergies  ?Social History  ? ?Tobacco Use  ? Smoking status: Never  ? Smokeless tobacco: Never  ?Substance Use Topics  ? Alcohol use: Yes  ?  Comment: occ  ?  ?Family History  ?Problem Relation Age of Onset  ? Breast cancer Mother   ? Lung cancer Mother   ? Lung cancer Father   ?     smoked  ? Heart disease Maternal Grandmother   ? Hypertension Brother   ? Liver cancer Brother   ? Pneumonia Brother   ?  ? ?Review of Systems  ?Constitutional:  Negative for chills and fever.  ?Respiratory:  Negative for cough and shortness of breath.   ?Cardiovascular:  Negative for chest pain.  ?Gastrointestinal:  Negative for nausea and vomiting.  ?Musculoskeletal:  Positive for arthralgias.  ? ? ?Objective: ? ?Physical Exam ?Well nourished and well developed. ?General: Alert and oriented  x3, cooperative and pleasant, no acute distress. ?Head: normocephalic, atraumatic, neck supple. ?Eyes: EOMI. ? ?Musculoskeletal: ?Bilateral knee exams: ?No palpable effusions, warmth or erythema ?Near full active and passive extension ?Tenderness over the lateral and anterior aspect of both of her knees ?Passively correctable valgus with some discomfort ?Flexion close to 120 degrees bilaterally ?Neurovascular intact distally without lower extremity edema, erythema or calf tenderness ? ?Calves soft and nontender. Motor function intact in LE. Strength 5/5 LE bilaterally. ?Neuro: Distal pulses 2+. Sensation to light touch  intact in LE. ? ?Vital signs in last 24 hours: ?Temp:  [97.6 ?F (36.4 ?C)] 97.6 ?F (36.4 ?C) (04/25 4840) ?Pulse Rate:  [84] 84 (04/25 3979) ?Resp:  [16] 16 (04/25 5369) ?BP: (156)/(96) 156/96 (04/25 2230) ?SpO2:  [98 %] 98 % (04/25 0979) ? ?Labs: ? ? ?Estimated body mass index is 35.73 kg/m? as calculated from the following: ?  Height as of 02/24/22: 5' 5.5" (1.664 m). ?  Weight as of 02/24/22: 98.9 kg. ? ? ?Imaging Review ?Plain radiographs demonstrate severe degenerative joint disease of the right knee(s). The overall alignment is slight valgus. The bone quality appears to be adequate for age and reported activity level. ? ? ? ? ? ?Assessment/Plan: ? ?End stage arthritis, right knee  ? ?The patient history, physical examination, clinical judgment of the provider and imaging studies are consistent with end stage degenerative joint disease of the right knee(s) and total knee arthroplasty is deemed medically necessary. The treatment options including medical management, injection therapy arthroscopy and arthroplasty were discussed at length. The risks and benefits of total knee arthroplasty were presented and reviewed. The risks due to aseptic loosening, infection, stiffness, patella tracking problems, thromboembolic complications and other imponderables were discussed. The patient acknowledged the explanation, agreed to proceed with the plan and consent was signed. Patient is being admitted for inpatient treatment for surgery, pain control, PT, OT, prophylactic antibiotics, VTE prophylaxis, progressive ambulation and ADL's and discharge planning. The patient is planning to be discharged  home. ? ?Therapy Plans: outpatient therapy at [facility] ?Disposition: Home with husband ?Planned DVT Prophylaxis: aspirin 31m BID ?DME needed: walker ?PCP: Dr. SBaird Cancer clearance received ?Cardiologist: Dr. GEinar Gip clearance received ?TXA: IV ?Allergies: NKDA ?Anesthesia Concerns: none ?BMI: 35.3 ?Last HgbA1c: 5.3% not  diabetic ? ? ?Other: ?- Wants diflucan with abx ?- Staying overnight ?- Oxycodone, celebrex (daily) ?- [facility]) ? ? ? ?Patient's anticipated LOS is less than 2 midnights, meeting these requirements: ?- Younger than 643?- Lives within 1 hour of care ?- Has a competent adult at home to recover with post-op recover ?- NO history of ? - Chronic pain requiring opiods ? - Diabetes ? - Coronary Artery Disease ? - Heart failure ? - Heart attack ? - Stroke ? - DVT/VTE ? - Cardiac arrhythmia ? - Respiratory Failure/COPD ? - Renal failure ? - Anemia ? - Advanced Liver disease ? ?ACostella Hatcher PA-C ?Orthopedic Surgery ?[facility] [place] ?[phone] ? ?

## 2022-03-09 NOTE — Anesthesia Procedure Notes (Signed)
Anesthesia Regional Block: Adductor canal block  ? ?Pre-Anesthetic Checklist: , timeout performed,  Correct Patient, Correct Site, Correct Laterality,  Correct Procedure, Correct Position, site marked,  Risks and benefits discussed,  Surgical consent,  Pre-op evaluation,  At surgeon's request and post-op pain management ? ?Laterality: Right ? ?Prep: chloraprep     ?  ?Needles:  ?Injection technique: Single-shot ? ?Needle Type: Stimiplex   ? ? ?Needle Length: 9cm  ?Needle Gauge: 21  ? ? ? ?Additional Needles: ? ? ?Procedures:,,,, ultrasound used (permanent image in chart),,    ?Narrative:  ?Start time: 03/09/2022 8:28 AM ?End time: 03/09/2022 8:33 AM ?Injection made incrementally with aspirations every 5 mL. ? ?Performed by: Personally  ?Anesthesiologist: Lynda Rainwater, MD ? ? ? ? ?

## 2022-03-09 NOTE — Progress Notes (Signed)
Vital signs at 1538 entered on incorrect patient. Notified aide and incorrect vital signs deleted. ?Ivan Anchors, RN ?03/09/22 ?4:38 PM ? ?

## 2022-03-09 NOTE — Interval H&P Note (Signed)
History and Physical Interval Note: ? ?03/09/2022 ?7:07 AM ? ?Kristine [name]  has presented today for surgery, with the diagnosis of Right knee osteoarthritis.  The various methods of treatment have been discussed with the patient and family. After consideration of risks, benefits and other options for treatment, the patient has consented to  Procedure(s): ?TOTAL KNEE ARTHROPLASTY (Right) as a surgical intervention.  The patient's history has been reviewed, patient examined, no change in status, stable for surgery.  I have reviewed the patient's chart and labs.  Questions were answered to the patient's satisfaction.   ? ? ?Mauri Pole ? ? ?

## 2022-03-09 NOTE — Progress Notes (Signed)
Assisted Dr. Sabra Heck with right, adductor canal, ultrasound guided block. Side rails up, monitors on throughout procedure. See vital signs in flow sheet. Tolerated Procedure well. ? ?

## 2022-03-09 NOTE — Op Note (Signed)
NAME:  Kristine [name]                  ?   ? MEDICAL RECORD NO.:  [id number]  ?   ?                        FACILITY:  [facility]  ?   ? PHYSICIAN:  Pietro Cassis. Alvan Dame, M.D.  DATE OF BIRTH:  1951/06/23  ?   ? DATE OF PROCEDURE:  03/09/2022   ?  ?   ?                             OPERATIVE REPORT  ?   ?   ? PREOPERATIVE DIAGNOSIS:  Right knee osteoarthritis.  ?   ? POSTOPERATIVE DIAGNOSIS:  Right knee osteoarthritis.  ?   ? FINDINGS:  The patient was noted to have complete loss of cartilage and  ? bone-on-bone arthritis with associated osteophytes in the lateral and patellofemoral compartments of  ? the knee with a significant synovitis and associated effusion.  The patient had failed months of conservative treatment including medications, injection therapy, activity modification. ?   ? PROCEDURE:  Right total knee replacement.  ?   ? COMPONENTS USED:  DePuy Attune rotating platform posterior stabilized knee  ? system, a size 3 femur, 4 tibia, size 6 mm PS AOX insert, and 35 anatomic patellar  ? button.  ?   ? SURGEON:  Pietro Cassis. Alvan Dame, M.D.  ?   ? ASSISTANT:  Costella Hatcher, PA-C.  ?   ? ANESTHESIA:  Regional and Spinal.  ?   ? SPECIMENS:  None.  ?   ? COMPLICATION:  None.  ?   ? DRAINS:  None. ? ?EBL: <100 cc  ?   ? TOURNIQUET TIME:   ?Total Tourniquet Time Documented: ?Thigh (Right) - 36 minutes ?Total: Thigh (Right) - 36 minutes ? .  ?   ? The patient was stable to the recovery room.  ?   ? INDICATION FOR PROCEDURE:  Kristine [name] is a 71 y.o. female patient of  ? mine.  The patient had been seen, evaluated, and treated for months conservatively in the  ? office with medication, activity modification, and injections.  The patient had  ? radiographic changes of bone-on-bone arthritis with endplate sclerosis and osteophytes noted.  Based on the radiographic changes and failed conservative measures, the patient  ? decided to proceed with definitive treatment, total knee replacement.  Risks of infection, DVT, component  failure, need for revision surgery, neurovascular injury were reviewed in the office setting.  The postop course was reviewed stressing the efforts to maximize post-operative satisfaction and function.  Consent was obtained for benefit of pain  ? relief.  ?   ? PROCEDURE IN DETAIL:  The patient was brought to the operative theater.  ? Once adequate anesthesia, preoperative antibiotics, 2 gm of Ancef,1 gm of Tranexamic Acid, and 10 mg of Decadron administered, the patient was positioned supine with a right thigh tourniquet placed.  The  ?right lower extremity was prepped and draped in sterile fashion.  A time-  ? out was performed identifying the patient, planned procedure, and the appropriate extremity.  ?   ? The right lower extremity was placed in the [facility] leg holder.  The leg was  ? exsanguinated, tourniquet elevated to 250 mmHg.  A midline incision was  ?  made followed by median parapatellar arthrotomy.  Following initial  ? exposure, attention was first directed to the patella.  Precut  ? measurement was noted to be 24 mm.  I resected down to 14 mm and used a  ? 35 anatomic patellar button to restore patellar height as well as cover the cut surface.  ?   ? The lug holes were drilled and a metal shim was placed to protect the  ? patella from retractors and saw blade during the procedure.  ?   ? At this point, attention was now directed to the femur.  The femoral  ? canal was opened with a drill, irrigated to try to prevent fat emboli.  An  ? intramedullary rod was passed at 3 degrees valgus, 11 mm of bone was  ? resected off the distal femur.  Following this resection, the tibia was  ? subluxated anteriorly.  Using the extramedullary guide, 2 mm of bone was resected off  ? the proximal lateral tibia.  We confirmed the gap would be  ? stable medially and laterally with a size 5 spacer block as well as confirmed that the tibial cut was perpendicular in the coronal plane, checking with an alignment rod.  ?   ?  Once this was done, I sized the femur to be a size 3 in the anterior-  ? posterior dimension, chose a standard component based on medial and  ? lateral dimension.  The size 3 rotation block was then pinned in  ? position anterior referenced using the C-clamp to set rotation.  The  ? anterior, posterior, and  chamfer cuts were made without difficulty nor  ? notching making certain that I was along the anterior cortex to help  ? with flexion gap stability.  ?   ? The final box cut was made off the lateral aspect of distal femur.  ?   ? At this point, the tibia was sized to be a size 4.  The size 4 tray was  ? then pinned in position through the medial third of the tubercle,  ? drilled, and keel punched.  Trial reduction was now carried with a 3 femur, ? 4 tibia, a size 6 mm PS insert, and the 35 anatomic patella botton.  The knee was brought to full extension with good flexion stability with the patella  ? tracking through the trochlea without application of pressure.  Given  ? all these findings the trial components removed.  Final components were  ? opened and cement was mixed.  The knee was irrigated with normal saline solution and pulse lavage.  The synovial lining was  ? then injected with 30 cc of 0.25% Marcaine with epinephrine, 1 cc of Toradol and 30 cc of NS for a total of 61 cc.  ?   ?Final implants were then cemented onto cleaned and dried cut surfaces of bone with the knee brought to extension with a size 6 mm PS trial insert.  ?   ? Once the cement had fully cured, excess cement was removed  ? throughout the knee.  I confirmed that I was satisfied with the range of  ? motion and stability, and the final size 6 mm PS AOX insert was chosen.  It was  ? placed into the knee.  ?   ? The tourniquet had been let down at 36 minutes.  No significant  ? hemostasis was required.  The extensor mechanism was then reapproximated using #1 Vicryl  and #1 Stratafix sutures with the knee  ? in flexion.  The  ? remaining  wound was closed with 2-0 Vicryl and running 4-0 Monocryl.  ? The knee was cleaned, dried, dressed sterilely using Dermabond and  ? Aquacel dressing.  The patient was then  ? brought to recovery room in stable condition, tolerating the procedure  ? well.  ? ?Please note that Physician Assistant, Costella Hatcher, PA-C was present for the entirety of the case, and was utilized for pre-operative positioning, peri-operative retractor management, general facilitation of the procedure and for primary wound closure at the end of the case. ?   ?   ?   ?   ? Pietro Cassis Alvan Dame, M.D.  ? ? ?03/09/2022 10:04 AM  ?

## 2022-03-09 NOTE — Evaluation (Signed)
Physical Therapy Evaluation ?Patient Details ?Name: Kristine [name] ?MRN: [id number] ?DOB: 05-13-1951 ?Today's Date: 03/09/2022 ? ?History of Present Illness ? Pt is a 71yo female presenting s/p R-TKA on 03/09/22. PMH: OA, HTN, sarcoidosis  ?Clinical Impression ? Kristine [name] is a 71 y.o. female POD 0 s/p R-TKA. Patient reports independence with mobility at baseline. Patient is now limited by functional impairments (see PT problem list below) and requires min guard for transfers and gait with RW. Patient was able to ambulate 30 feet with RW and min guard assist. Patient instructed in exercise to facilitate ROM and circulation to manage edema, reviewed incentive spirometry and pt demonstrated good technique. Educated pt's children on appropriate car transfer and recommended bringing car that does not require running board to enter/exit. Patient will benefit from continued skilled PT interventions to address impairments and progress towards PLOF. Acute PT will follow to progress mobility and stair training in preparation for safe discharge home.   ?   ? ?Recommendations for follow up therapy are one component of a multi-disciplinary discharge planning process, led by the attending physician.  Recommendations may be updated based on patient status, additional functional criteria and insurance authorization. ? ?Follow Up Recommendations Follow physician's recommendations for discharge plan and follow up therapies ? ?  ?Assistance Recommended at Discharge Set up Supervision/Assistance  ?Patient can return home with the following ? A little help with walking and/or transfers;A little help with bathing/dressing/bathroom;Assistance with cooking/housework;Assist for transportation;Help with stairs or ramp for entrance ? ?  ?Equipment Recommendations Rolling walker (2 wheels)  ?Recommendations for Other Services ?    ?  ?Functional Status Assessment Patient has had a recent decline in their functional status and  demonstrates the ability to make significant improvements in function in a reasonable and predictable amount of time.  ? ?  ?Precautions / Restrictions    ? ?  ? ?Mobility ? Bed Mobility ?Overal bed mobility: Needs Assistance ?Bed Mobility: Supine to Sit ?  ?  ?Supine to sit: Supervision ?  ?  ?General bed mobility comments: Pt supervision for bed mobility, no physical assist required. ?  ? ?Transfers ?Overall transfer level: Needs assistance ?Equipment used: Rolling walker (2 wheels) ?Transfers: Sit to/from Stand ?Sit to Stand: Min guard ?  ?  ?  ?  ?  ?General transfer comment: Pt min guard for safety only, no physical assist required, VCs for sequencing and hand placement ?  ? ?Ambulation/Gait ?Ambulation/Gait assistance: +2 safety/equipment, Min guard ?Gait Distance (Feet): 30 Feet ?Assistive device: Rolling walker (2 wheels) ?Gait Pattern/deviations: Step-to pattern ?Gait velocity: decreased ?  ?  ?General Gait Details: Pt ambulated with RW, min guard assist with recliner follow for safety only, no physical assist required. VCs for proximity to device and sequencings. ? ?Stairs ?  ?  ?  ?  ?  ? ?Wheelchair Mobility ?  ? ?Modified Rankin (Stroke Patients Only) ?  ? ?  ? ?Balance Overall balance assessment: Needs assistance ?Sitting-balance support: No upper extremity supported, Feet supported ?Sitting balance-Leahy Scale: Fair ?  ?  ?Standing balance support: Bilateral upper extremity supported, During functional activity, Reliant on assistive device for balance ?Standing balance-Leahy Scale: Poor ?  ?  ?  ?  ?  ?  ?  ?  ?  ?  ?  ?  ?   ? ? ? ?Pertinent Vitals/Pain Pain Assessment ?Pain Assessment: 0-10 ?Pain Score: 4  ?Pain Location: R KNEE ?Pain Descriptors / Indicators: Operative site guarding ?Pain Intervention(s):  Limited activity within patient's tolerance, Monitored during session, Repositioned, Ice applied  ? ? ?Home Living Family/patient expects to be discharged to:: Private residence ?Living  Arrangements: Spouse/significant other;Children ?Available Help at Discharge: Family;Available 24 hours/day (Daughter Tillie Rung) ?Type of Home: House ?Home Access: Stairs to enter ?Entrance Stairs-Rails: None ?Entrance Stairs-Number of Steps: 2 ?  ?Home Layout: One level ?Home Equipment: Rollator (4 wheels) ?Additional Comments: son Marcelino Scot and daughter Tillie Rung present for session  ?  ?Prior Function Prior Level of Function : Independent/Modified Independent ?  ?  ?  ?  ?  ?  ?Mobility Comments: ind ?ADLs Comments: ind ?  ? ? ?Hand Dominance  ? Dominant Hand: Right ? ?  ?Extremity/Trunk Assessment  ? Upper Extremity Assessment ?Upper Extremity Assessment: Overall WFL for tasks assessed ?  ? ?Lower Extremity Assessment ?Lower Extremity Assessment: RLE deficits/detail;LLE deficits/detail ?RLE Deficits / Details: MMT ank df/pf 5/5, no extensor lag noted ?RLE Sensation: WNL ?LLE Deficits / Details: MMT ank df/pf 5/5 ?LLE Sensation: WNL ?  ? ?Cervical / Trunk Assessment ?Cervical / Trunk Assessment: Normal  ?Communication  ? Communication: No difficulties  ?Cognition Arousal/Alertness: Awake/alert ?Behavior During Therapy: [facility] for tasks assessed/performed ?Overall Cognitive Status: Within Functional Limits for tasks assessed ?  ?  ?  ?  ?  ?  ?  ?  ?  ?  ?  ?  ?  ?  ?  ?  ?  ?  ?  ? ?  ?General Comments   ? ?  ?Exercises Total Joint Exercises ?Ankle Circles/Pumps: AROM, Both, 20 reps  ? ?Assessment/Plan  ?  ?PT Assessment Patient needs continued PT services  ?PT Problem List Decreased strength;Decreased range of motion;Decreased activity tolerance;Decreased balance;Decreased mobility;Decreased coordination;Decreased knowledge of use of DME;Pain ? ?   ?  ?PT Treatment Interventions DME instruction;Gait training;Stair training;Functional mobility training;Therapeutic activities;Therapeutic exercise;Balance training;Neuromuscular re-education;Patient/family education   ? ?PT Goals (Current goals can be found in the Care Plan  section)  ?Acute Rehab PT Goals ?Patient Stated Goal: walk the grocery store ?PT Goal Formulation: With patient ?Time For Goal Achievement: 03/16/22 ?Potential to Achieve Goals: Good ? ?  ?Frequency 7X/week ?  ? ? ?Co-evaluation   ?  ?  ?  ?  ? ? ?  ?AM-PAC PT "6 Clicks" Mobility  ?Outcome Measure Help needed turning from your back to your side while in a flat bed without using bedrails?: None ?Help needed moving from lying on your back to sitting on the side of a flat bed without using bedrails?: None ?Help needed moving to and from a bed to a chair (including a wheelchair)?: A Little ?Help needed standing up from a chair using your arms (e.g., wheelchair or bedside chair)?: A Little ?Help needed to walk in hospital room?: A Little ?Help needed climbing 3-5 steps with a railing? : A Little ?6 Click Score: 20 ? ?  ?End of Session Equipment Utilized During Treatment: Gait belt ?Activity Tolerance: Patient tolerated treatment well ?Patient left: in chair;with call bell/phone within reach;with chair alarm set;with family/visitor present ?Nurse Communication: Mobility status ?PT Visit Diagnosis: Difficulty in walking, not elsewhere classified (R26.2) ?  ? ?Time: 0092-3300 ?PT Time Calculation (min) (ACUTE ONLY): 18 min ? ? ?Charges:   PT Evaluation ?$PT Eval Low Complexity: 1 Low ?  ?  ?   ? ? ?Coolidge Breeze, PT, DPT ?[facility] Department ?Office: [phone] ?Pager: [phone] ? ?Coolidge Breeze ?03/09/2022, 5:57 PM ? ?

## 2022-03-09 NOTE — Transfer of Care (Signed)
Immediate Anesthesia Transfer of Care Note ? ?Patient: Kristine [name] ? ?Procedure(s) Performed: TOTAL KNEE ARTHROPLASTY (Right: Knee) ? ?Patient Location: PACU ? ?Anesthesia Type:Spinal ? ?Level of Consciousness: awake and patient cooperative ? ?Airway & Oxygen Therapy: Patient Spontanous Breathing ? ?Post-op Assessment: Report given to RN and Post -op Vital signs reviewed and stable ? ?Post vital signs: Reviewed and stable ? ?Last Vitals:  ?Vitals Value Taken Time  ?BP 118/70 03/09/22 1031  ?Temp    ?Pulse 77 03/09/22 1032  ?Resp 14 03/09/22 1032  ?SpO2 98 % 03/09/22 1032  ?Vitals shown include unvalidated device data. ? ?Last Pain:  ?Vitals:  ? 03/09/22 0821  ?TempSrc:   ?PainSc: 0-No pain  ?   ? ?Patients Stated Pain Goal: 4 (03/09/22 0709) ? ?Complications: No notable events documented. ?

## 2022-03-10 ENCOUNTER — Encounter (HOSPITAL_COMMUNITY): Payer: Self-pay | Admitting: Orthopedic Surgery

## 2022-03-10 DIAGNOSIS — M1711 Unilateral primary osteoarthritis, right knee: Secondary | ICD-10-CM | POA: Diagnosis not present

## 2022-03-10 LAB — BASIC METABOLIC PANEL
Anion gap: 6 (ref 5–15)
BUN: 19 mg/dL (ref 8–23)
CO2: 25 mmol/L (ref 22–32)
Calcium: 8.6 mg/dL — ABNORMAL LOW (ref 8.9–10.3)
Chloride: 110 mmol/L (ref 98–111)
Creatinine, Ser: 1.01 mg/dL — ABNORMAL HIGH (ref 0.44–1.00)
GFR, Estimated: >60 mL/min (ref >60)
Glucose, Bld: 124 mg/dL — ABNORMAL HIGH (ref 70–99)
Potassium: 3.8 mmol/L (ref 3.5–5.1)
Sodium: 141 mmol/L (ref 135–145)

## 2022-03-10 LAB — CBC
HCT: 29.3 % — ABNORMAL LOW (ref 36.0–46.0)
Hemoglobin: 9.6 g/dL — ABNORMAL LOW (ref 12.0–15.0)
MCH: 27.4 pg (ref 26.0–34.0)
MCHC: 32.8 g/dL (ref 30.0–36.0)
MCV: 83.7 fL (ref 80.0–100.0)
Platelets: 180 10*3/uL (ref 150–400)
RBC: 3.5 MIL/uL — ABNORMAL LOW (ref 3.87–5.11)
RDW: 13.1 % (ref 11.5–15.5)
WBC: 11.6 10*3/uL — ABNORMAL HIGH (ref 4.0–10.5)
nRBC: 0 % (ref 0.0–0.2)

## 2022-03-10 MED ORDER — DOCUSATE SODIUM 100 MG PO CAPS: 100.0000 mg | ORAL_CAPSULE | Freq: Two times a day (BID) | ORAL | 0 refills | Status: DC

## 2022-03-10 MED ORDER — ASPIRIN 81 MG PO CHEW: 81.0000 mg | CHEWABLE_TABLET | Freq: Two times a day (BID) | ORAL | 0 refills | Status: DC

## 2022-03-10 MED ORDER — METHOCARBAMOL 500 MG PO TABS: 500.0000 mg | ORAL_TABLET | Freq: Four times a day (QID) | ORAL | 0 refills | Status: DC | PRN

## 2022-03-10 MED ORDER — OXYCODONE HCL 5 MG PO TABS: 5.0000 mg | ORAL_TABLET | ORAL | 0 refills | Status: DC | PRN

## 2022-03-10 MED ORDER — POLYETHYLENE GLYCOL 3350 17 G PO PACK: 17.0000 g | PACK | Freq: Every day | ORAL | 0 refills | Status: DC | PRN

## 2022-03-10 MED ORDER — FLUCONAZOLE 150 MG PO TABS
150.0000 mg | ORAL_TABLET | Freq: Once | ORAL | Status: AC
  Administered 2022-03-10: 150 mg via ORAL
  Filled 2022-03-10: qty 1

## 2022-03-10 MED ORDER — CELECOXIB 200 MG PO CAPS: 200.0000 mg | ORAL_CAPSULE | Freq: Every day | ORAL | 0 refills | Status: DC

## 2022-03-10 MED ORDER — ASPIRIN 81 MG PO CHEW: 81.0000 mg | CHEWABLE_TABLET | Freq: Two times a day (BID) | ORAL | 0 refills | Status: AC

## 2022-03-10 NOTE — TOC Transition Note (Signed)
Transition of Care (TOC) - CM/SW Discharge Note ? ? ?Patient Details  ?Name: Kristine [name] ?MRN: [id number] ?Date of Birth: 06/13/1951 ? ?Transition of Care (TOC) CM/SW Contact:  ?, , LCSW ?Phone Number: ?03/10/2022, 10:02 AM ? ? ?Clinical Narrative:    ?Met with pt and confirming she has received rolling walker via [employer].  Set up for OPPT at [facility].  No TOC needs. ? ? ?Final next level of care: OP Rehab ?Barriers to Discharge: No Barriers Identified ? ? ?Patient Goals and CMS Choice ?Patient states their goals for this hospitalization and ongoing recovery are:: return home ?  ?  ? ?Discharge Placement ?  ?           ?  ?  ?  ?  ? ?Discharge Plan and Services ?  ?  ?           ?DME Arranged: Walker rolling ?DME Agency: [employer] ?  ?  ?  ?  ?  ?  ?  ?  ? ?Social Determinants of Health (SDOH) Interventions ?  ? ? ?Readmission Risk Interventions ?   ? View : No data to display.  ?  ?  ?  ? ? ? ? ? ?

## 2022-03-10 NOTE — Plan of Care (Signed)
?  Problem: Education: ?Goal: Knowledge of General Education information will improve ?Description: Including pain rating scale, medication(s)/side effects and non-pharmacologic comfort measures ?03/10/2022 0857 by Williams Che, RN ?Outcome: Progressing ?03/10/2022 0857 by Williams Che, RN ?Outcome: Progressing ?  ?Problem: Health Behavior/Discharge Planning: ?Goal: Ability to manage health-related needs will improve ?03/10/2022 0857 by Williams Che, RN ?Outcome: Progressing ?03/10/2022 0857 by Williams Che, RN ?Outcome: Progressing ?  ?Problem: Clinical Measurements: ?Goal: Ability to maintain clinical measurements within normal limits will improve ?03/10/2022 0857 by Williams Che, RN ?Outcome: Progressing ?03/10/2022 0857 by Williams Che, RN ?Outcome: Progressing ?  ?Problem: Activity: ?Goal: Risk for activity intolerance will decrease ?03/10/2022 0857 by Williams Che, RN ?Outcome: Progressing ?03/10/2022 0857 by Williams Che, RN ?Outcome: Progressing ?  ?Problem: Nutrition: ?Goal: Adequate nutrition will be maintained ?03/10/2022 0857 by Williams Che, RN ?Outcome: Progressing ?03/10/2022 0857 by Williams Che, RN ?Outcome: Progressing ?  ?Problem: Coping: ?Goal: Level of anxiety will decrease ?03/10/2022 0857 by Williams Che, RN ?Outcome: Progressing ?03/10/2022 0857 by Williams Che, RN ?Outcome: Progressing ?  ?Problem: Elimination: ?Goal: Will not experience complications related to bowel motility ?Outcome: Progressing ?  ?Problem: Pain Managment: ?Goal: General experience of comfort will improve ?Outcome: Progressing ?  ?Problem: Safety: ?Goal: Ability to remain free from injury will improve ?Outcome: Progressing ?  ?Problem: Skin Integrity: ?Goal: Risk for impaired skin integrity will decrease ?Outcome: Progressing ?  ?

## 2022-03-10 NOTE — Progress Notes (Signed)
Physical Therapy Treatment ?Patient Details ?Name: Kristine [name] ?MRN: [id number] ?DOB: 1951/11/02 ?Today's Date: 03/10/2022 ? ? ?History of Present Illness Pt is a 71yo female presenting s/p R-TKA on 03/09/22. PMH: OA, HTN, sarcoidosis ? ?  ?PT Comments  ? ? POD # 1 am session ?Assisted OOB to amb went well.  General bed mobility comments: instructed how to use a belt to self assist LE plus increased time.  General transfer comment: 25% VC's on proper hand placement and LE extension.  Also assisted with a toilet transfer. General Gait Details: tolerated amb a functional distance in hallway with 25% VC's on proper sequencing and safety with turns in bathroom. Then returned to room to perform some TE's following HEP handout.  Instructed on proper tech, freq as well as use of ICE.   ?Pt will need another PT session to complete HEP and practice stairs.    ?Recommendations for follow up therapy are one component of a multi-disciplinary discharge planning process, led by the attending physician.  Recommendations may be updated based on patient status, additional functional criteria and insurance authorization. ? ?Follow Up Recommendations ? Follow physician's recommendations for discharge plan and follow up therapies ?  ?  ?Assistance Recommended at Discharge Set up Supervision/Assistance  ?Patient can return home with the following A little help with walking and/or transfers;A little help with bathing/dressing/bathroom;Assistance with cooking/housework;Assist for transportation;Help with stairs or ramp for entrance ?  ?Equipment Recommendations ? Rolling walker (2 wheels)  ?  ?Recommendations for Other Services   ? ? ?  ?Precautions / Restrictions Precautions ?Precautions: None ?Precaution Comments: instructed no pillow under knee ?Restrictions ?Weight Bearing Restrictions: Yes ?RLE Weight Bearing: Weight bearing as tolerated  ?  ? ?Mobility ? Bed Mobility ?Overal bed mobility: Needs Assistance ?Bed Mobility: Supine to  Sit ?  ?  ?Supine to sit: Supervision ?  ?  ?General bed mobility comments: instructed how to use a belt to self assist LE plus increased time ?  ? ?Transfers ?Overall transfer level: Needs assistance ?Equipment used: Rolling walker (2 wheels) ?Transfers: Sit to/from Stand ?Sit to Stand: Min guard, Supervision ?  ?  ?  ?  ?  ?General transfer comment: 25% VC's on proper hand placement and LE extension.  Also assisted with a toilet transfer. ?  ? ?Ambulation/Gait ?Ambulation/Gait assistance: Min guard, Supervision ?Gait Distance (Feet): 35 Feet ?Assistive device: Rolling walker (2 wheels) ?Gait Pattern/deviations: Step-to pattern, Decreased stance time - right ?Gait velocity: decreased ?  ?  ?General Gait Details: tolerated amb a functional distance in hallway with 25% VC's on proper sequencing and safety with turns in bathroom. ? ? ?Stairs ?  ?  ?  ?  ?  ? ? ?Wheelchair Mobility ?  ? ?Modified Rankin (Stroke Patients Only) ?  ? ? ?  ?Balance   ?  ?  ?  ?  ?  ?  ?  ?  ?  ?  ?  ?  ?  ?  ?  ?  ?  ?  ?  ? ?  ?Cognition Arousal/Alertness: Awake/alert ?Behavior During Therapy: [facility] for tasks assessed/performed ?Overall Cognitive Status: Within Functional Limits for tasks assessed ?  ?  ?  ?  ?  ?  ?  ?  ?  ?  ?  ?  ?  ?  ?  ?  ?General Comments: AxO x 3 very pleasant and motivated ?  ?  ? ?  ?Exercises  Total Knee Replacement TE's following  HEP handout ?10 reps B LE ankle pumps ?05 reps towel squeezes ?05 reps knee presses ?05 reps heel slides  ?05 reps SAQ's ?05 reps SLR's ?05 reps ABD ?Educated on use of gait belt to assist with TE's ?Followed by ICE ? ? ?  ?General Comments   ?  ?  ? ?Pertinent Vitals/Pain Pain Assessment ?Pain Assessment: Faces ?Pain Score: 5  ?Pain Location: R KNEE ?Pain Descriptors / Indicators: Operative site guarding, Tender, Tightness ?Pain Intervention(s): Monitored during session, Premedicated before session, Repositioned, Ice applied  ? ? ?Home Living   ?  ?  ?  ?  ?  ?  ?  ?  ?  ?   ?  ?Prior  Function    ?  ?  ?   ? ?PT Goals (current goals can now be found in the care plan section) Progress towards PT goals: Progressing toward goals ? ?  ?Frequency ? ? ? 7X/week ? ? ? ?  ?PT Plan Current plan remains appropriate  ? ? ?Co-evaluation   ?  ?  ?  ?  ? ?  ?AM-PAC PT "6 Clicks" Mobility   ?Outcome Measure ? Help needed turning from your back to your side while in a flat bed without using bedrails?: A Little ?Help needed moving from lying on your back to sitting on the side of a flat bed without using bedrails?: A Little ?Help needed moving to and from a bed to a chair (including a wheelchair)?: A Little ?Help needed standing up from a chair using your arms (e.g., wheelchair or bedside chair)?: A Little ?Help needed to walk in hospital room?: A Little ?Help needed climbing 3-5 steps with a railing? : A Lot ?6 Click Score: 17 ? ?  ?End of Session Equipment Utilized During Treatment: Gait belt ?Activity Tolerance: Patient tolerated treatment well ?Patient left: in chair;with call bell/phone within reach;with chair alarm set;with family/visitor present ?Nurse Communication: Mobility status ?PT Visit Diagnosis: Difficulty in walking, not elsewhere classified (R26.2) ?  ? ? ?Time: 1010-1038 ?PT Time Calculation (min) (ACUTE ONLY): 28 min ? ?Charges:  $Gait Training: 8-22 mins ?$Therapeutic Exercise: 8-22 mins          ?          ? ?Rica Koyanagi  PTA ?Acute  Rehabilitation Services ?Pager      [phone] ?Office      [phone] ? ? ?

## 2022-03-10 NOTE — Progress Notes (Signed)
Provided discharge education/instructions, all questions and concerns addressed. Pt not in acute distress, discharged home with belongings accompanied by daughter. ?

## 2022-03-10 NOTE — Progress Notes (Signed)
Physical Therapy Treatment ?Patient Details ?Name: Kristine [name] ?MRN: [id number] ?DOB: 02-17-1951 ?Today's Date: 03/10/2022 ? ? ?History of Present Illness Pt is a 71yo female presenting s/p R-TKA on 03/09/22. PMH: OA, HTN, sarcoidosis ? ?  ?PT Comments  ? ? POD # 1 pm session ?Daughter present during session for "hands on" training on safe handling esp stairs.  General Gait Details: tolerated amb a functional distance in hallway with 25% VC's on proper sequencing and safety with turns in bathroom. Then returned to room to perform some TE's following HEP handout.  Instructed on proper tech, freq as well as use of ICE.   ?Addressed all mobility questions, discussed appropriate activity, educated on use of ICE.  Pt ready for D/C to home. ?  ?Recommendations for follow up therapy are one component of a multi-disciplinary discharge planning process, led by the attending physician.  Recommendations may be updated based on patient status, additional functional criteria and insurance authorization. ? ?Follow Up Recommendations ? Follow physician's recommendations for discharge plan and follow up therapies ?  ?  ?Assistance Recommended at Discharge Set up Supervision/Assistance  ?Patient can return home with the following A little help with walking and/or transfers;A little help with bathing/dressing/bathroom;Assistance with cooking/housework;Assist for transportation;Help with stairs or ramp for entrance ?  ?Equipment Recommendations ? Rolling walker (2 wheels)  ?  ?Recommendations for Other Services   ? ? ?  ?Precautions / Restrictions Precautions ?Precautions: None ?Precaution Comments: instructed no pillow under knee ?Restrictions ?Weight Bearing Restrictions: Yes ?RLE Weight Bearing: Weight bearing as tolerated  ?  ? ?Mobility ? Bed Mobility ?Overal bed mobility: Needs Assistance ?Bed Mobility: Supine to Sit ?  ?  ?Supine to sit: Supervision ?  ?  ?General bed mobility comments: instructed how to use a belt to self  assist LE plus increased time ?  ? ?Transfers ?Overall transfer level: Needs assistance ?Equipment used: Rolling walker (2 wheels) ?Transfers: Sit to/from Stand ?Sit to Stand: Min guard, Supervision ?  ?  ?  ?  ?  ?General transfer comment: 25% VC's on proper hand placement and LE extension.  Also assisted with a toilet transfer. ?  ? ?Ambulation/Gait ?Ambulation/Gait assistance: Min guard, Supervision ?Gait Distance (Feet): 45 Feet ?Assistive device: Rolling walker (2 wheels) ?Gait Pattern/deviations: Step-to pattern, Decreased stance time - right ?Gait velocity: decreased ?  ?  ?General Gait Details: tolerated amb a functional distance in hallway with 25% VC's on proper sequencing and safety with turns in bathroom. ? ? ?Stairs ?Stairs: Yes ?Stairs assistance: Min assist ?Stair Management: No rails, Step to pattern, Forwards ?Number of Stairs: 2 ?General stair comments: with daughter "hands on" assisted securing walker as pt performed 2 stair steps with no rails so used walker as rail. ? ? ?Wheelchair Mobility ?  ? ?Modified Rankin (Stroke Patients Only) ?  ? ? ?  ?Balance   ?  ?  ?  ?  ?  ?  ?  ?  ?  ?  ?  ?  ?  ?  ?  ?  ?  ?  ?  ? ?  ?Cognition Arousal/Alertness: Awake/alert ?Behavior During Therapy: [facility] for tasks assessed/performed ?Overall Cognitive Status: Within Functional Limits for tasks assessed ?  ?  ?  ?  ?  ?  ?  ?  ?  ?  ?  ?  ?  ?  ?  ?  ?General Comments: AxO x 3 very pleasant and motivated ?  ?  ? ?  ?  Exercises  Performed all seated TE's following HEP handout 5 reps each ? ?  ?General Comments   ?  ?  ? ?Pertinent Vitals/Pain Pain Assessment ?Pain Assessment: Faces ?Pain Score: 5  ?Pain Location: R KNEE ?Pain Descriptors / Indicators: Operative site guarding, Tender, Tightness ?Pain Intervention(s): Monitored during session, Premedicated before session, Repositioned, Ice applied  ? ? ?Home Living   ?  ?  ?  ?  ?  ?  ?  ?  ?  ?   ?  ?Prior Function    ?  ?  ?   ? ?PT Goals (current goals can now be  found in the care plan section) Progress towards PT goals: Progressing toward goals ? ?  ?Frequency ? ? ? 7X/week ? ? ? ?  ?PT Plan Current plan remains appropriate  ? ? ?Co-evaluation   ?  ?  ?  ?  ? ?  ?AM-PAC PT "6 Clicks" Mobility   ?Outcome Measure ? Help needed turning from your back to your side while in a flat bed without using bedrails?: A Little ?Help needed moving from lying on your back to sitting on the side of a flat bed without using bedrails?: A Little ?Help needed moving to and from a bed to a chair (including a wheelchair)?: A Little ?Help needed standing up from a chair using your arms (e.g., wheelchair or bedside chair)?: A Little ?Help needed to walk in hospital room?: A Little ?Help needed climbing 3-5 steps with a railing? : A Lot ?6 Click Score: 17 ? ?  ?End of Session Equipment Utilized During Treatment: Gait belt ?Activity Tolerance: Patient tolerated treatment well ?Patient left: in chair;with call bell/phone within reach;with chair alarm set;with family/visitor present ?Nurse Communication: Mobility status ?PT Visit Diagnosis: Difficulty in walking, not elsewhere classified (R26.2) ?  ? ? ?Time: 2446-2863 ?PT Time Calculation (min) (ACUTE ONLY): 27 min ? ?Charges:  $Gait Training: 8-22 mins ?$Therapeutic Exercise: 8-22 mins          ?          ? ?{Brent Noto  PTA ?Acute  Rehabilitation Services ?Pager      [phone] ?Office      [phone] ? ?

## 2022-03-10 NOTE — Progress Notes (Signed)
? ?  Subjective: ?1 Day Post-Op Procedure(s) (LRB): ?TOTAL KNEE ARTHROPLASTY (Right) ?Patient reports pain as mild.   ?Patient seen in rounds with Dr. Alvan Dame. ?Patient is well, and has had no acute complaints or problems. No acute events overnight. Foley catheter removed. Patient ambulated 30 feet with PT.  ?We will continue therapy today.  ? ?Objective: ?Vital signs in last 24 hours: ?Temp:  [97.4 ?F (36.3 ?C)-98 ?F (36.7 ?C)] 97.7 ?F (36.5 ?C) (04/26 0536) ?Pulse Rate:  [70-85] 77 (04/26 0536) ?Resp:  [10-20] 18 (04/26 0536) ?BP: (118-175)/(70-101) 127/73 (04/26 0536) ?SpO2:  [92 %-99 %] 95 % (04/26 0536) ? ?Intake/Output from previous day: ? ?Intake/Output Summary (Last 24 hours) at 03/10/2022 0748 ?Last data filed at 03/10/2022 0645 ?Gross per 24 hour  ?Intake 4295.06 ml  ?Output 4275 ml  ?Net 20.06 ml  ?  ? ?Intake/Output this shift: ?No intake/output data recorded. ? ?Labs: ?Recent Labs  ?  03/10/22 ?0315  ?HGB 9.6*  ? ?Recent Labs  ?  03/10/22 ?0315  ?WBC 11.6*  ?RBC 3.50*  ?HCT 29.3*  ?PLT 180  ? ?Recent Labs  ?  03/10/22 ?0315  ?NA 141  ?K 3.8  ?CL 110  ?CO2 25  ?BUN 19  ?CREATININE 1.01*  ?GLUCOSE 124*  ?CALCIUM 8.6*  ? ?No results for input(s): LABPT, INR in the last 72 hours. ? ?Exam: ?General - Patient is Alert and Oriented ?Extremity - Neurologically intact ?Sensation intact distally ?Intact pulses distally ?Dorsiflexion/Plantar flexion intact ?Dressing - dressing C/D/I ?Motor Function - intact, moving foot and toes well on exam.  ? ?Past Medical History:  ?Diagnosis Date  ? Allergy   ? Arthritis   ? Asthma   ? Back pain   ? Complication of anesthesia   ? slow to wake up  ? Dyspnea   ? Glaucoma   ? ROD  ? Heart murmur   ? Hypertension   ? Joint pain   ? Knee pain   ? Obesity   ? Sarcoidosis   ? SOB (shortness of breath)   ? ? ?Assessment/Plan: ?1 Day Post-Op Procedure(s) (LRB): ?TOTAL KNEE ARTHROPLASTY (Right) ?Principal Problem: ?  S/P total knee arthroplasty, right ? ?Estimated body mass index is 35.73  kg/m? as calculated from the following: ?  Height as of this encounter: 5' 5.5" (1.664 m). ?  Weight as of this encounter: 98.9 kg. ?Advance diet ?Up with therapy ?D/C IV fluids ? ? ?Patient's anticipated LOS is less than 2 midnights, meeting these requirements: ?- Younger than 70 ?- Lives within 1 hour of care ?- Has a competent adult at home to recover with post-op recover ?- NO history of ? - Chronic pain requiring opiods ? - Diabetes ? - Coronary Artery Disease ? - Heart failure ? - Heart attack ? - Stroke ? - DVT/VTE ? - Cardiac arrhythmia ? - Respiratory Failure/COPD ? - Renal failure ? - Anemia ? - Advanced Liver disease ? ?  ? ?DVT Prophylaxis - Aspirin ?Weight bearing as tolerated. ? ?Hgb stable at 9.6 this AM. ? ?Plan is to go Home after hospital stay. Plan for discharge today following 1-2 sessions of PT as long as they are meeting their goals. Patient is scheduled for OPPT. Follow up in the office in 2 weeks.  ? ?Griffith Citron, PA-C ?Orthopedic Surgery ?[phone] ?03/10/2022, 7:48 AM  ?

## 2022-03-18 NOTE — Discharge Summary (Signed)
Patient ID: ?Kristine [name] ?MRN: [id number] ?DOB/AGE: 70/28/1952 70 y.o. ? ?Admit date: 03/09/2022 ?Discharge date: 03/10/2022 ? ?Admission Diagnoses:  ?Right knee osteoarthritis ? ?Discharge Diagnoses:  ?Principal Problem: ?  S/P total knee arthroplasty, right ? ? ?Past Medical History:  ?Diagnosis Date  ? Allergy   ? Arthritis   ? Asthma   ? Back pain   ? Complication of anesthesia   ? slow to wake up  ? Dyspnea   ? Glaucoma   ? ROD  ? Heart murmur   ? Hypertension   ? Joint pain   ? Knee pain   ? Obesity   ? Sarcoidosis   ? SOB (shortness of breath)   ? ? ?Surgeries: Procedure(s): ?TOTAL KNEE ARTHROPLASTY on 03/09/2022 ?  ?Consultants:  ? ?Discharged Condition: Improved ? ?Hospital Course: Kristine [name] is an 71 y.o. female who was admitted 03/09/2022 for operative treatment ofS/P total knee arthroplasty, right. Patient has severe unremitting pain that affects sleep, daily activities, and work/hobbies. After pre-op clearance the patient was taken to the operating room on 03/09/2022 and underwent  Procedure(s): ?TOTAL KNEE ARTHROPLASTY.   ? ?Patient was given perioperative antibiotics:  ?Anti-infectives (From admission, onward)  ? ? Start     Dose/Rate Route Frequency Ordered Stop  ? 03/10/22 1000  fluconazole (DIFLUCAN) tablet 150 mg       ? 150 mg Oral  Once 03/10/22 0748 03/10/22 1052  ? 03/09/22 1500  ceFAZolin (ANCEF) IVPB 2g/100 mL premix       ? 2 g ?200 mL/hr over 30 Minutes Intravenous Every 6 hours 03/09/22 1410 03/10/22 0856  ? 03/09/22 0645  ceFAZolin (ANCEF) IVPB 2g/100 mL premix       ? 2 g ?200 mL/hr over 30 Minutes Intravenous On call to O.R. 03/09/22 6010 03/09/22 0859  ? ?  ?  ? ?Patient was given sequential compression devices, early ambulation, and chemoprophylaxis to prevent DVT. Patient worked with PT and was meeting their goals regarding safe ambulation and transfers. ? ?Patient benefited maximally from hospital stay and there were no complications.   ? ?Recent vital signs: No data found.   ? ?Recent laboratory studies: No results for input(s): WBC, HGB, HCT, PLT, NA, K, CL, CO2, BUN, CREATININE, GLUCOSE, INR, CALCIUM in the last 72 hours. ? ?Invalid input(s): PT, 2 ? ? ?Discharge Medications:   ?Allergies as of 03/10/2022   ?No Known Allergies ?  ? ?  ?Medication List  ?  ? ?STOP taking these medications   ? ?aspirin EC 81 MG tablet ?Replaced by: aspirin 81 MG chewable tablet ?  ? ?  ? ?TAKE these medications   ? ?albuterol 108 (90 Base) MCG/ACT inhaler ?Commonly known as: VENTOLIN HFA ?INHALE TWO PUFFS BY MOUTH EVERY 6 HOURS AS NEEDED FOR WHEEZING OR SHORTNESS OF BREATH ?Notes to patient: Resume home regimen ?  ?aspirin 81 MG chewable tablet ?Chew 1 tablet (81 mg total) by mouth 2 (two) times daily for 28 days. Then may resume once daily dosing ?Replaces: aspirin EC 81 MG tablet ?  ?atorvastatin 10 MG tablet ?Commonly known as: LIPITOR ?TAKE ONE TABLET BY MOUTH DAILY ?  ?CALCIUM PO ?Take 1 tablet by mouth daily. ?Notes to patient: Resume home regimen ?  ?celecoxib 200 MG capsule ?Commonly known as: CELEBREX ?Take 1 capsule (200 mg total) by mouth daily. ?  ?docusate sodium 100 MG capsule ?Commonly known as: COLACE ?Take 1 capsule (100 mg total) by mouth 2 (two) times daily. ?  ?  Magnesium 400 MG Caps ?Take 400 mg by mouth daily. ?Notes to patient: Resume home regimen ?  ?methocarbamol 500 MG tablet ?Commonly known as: ROBAXIN ?Take 1 tablet (500 mg total) by mouth every 6 (six) hours as needed for muscle spasms. ?Notes to patient: Last dose given 04/25 05:38pm ?  ?metoprolol succinate 25 MG 24 hr tablet ?Commonly known as: TOPROL-XL ?TAKE ONE TABLET BY MOUTH DAILY ?  ?oxyCODONE 5 MG immediate release tablet ?Commonly known as: Oxy IR/ROXICODONE ?Take 1-2 tablets (5-10 mg total) by mouth every 4 (four) hours as needed for severe pain. ?Notes to patient: Last dose given 04/26 04:40am ?  ?Ozempic (0.25 or 0.5 MG/DOSE) 2 MG/1.5ML Sopn ?Generic drug: Semaglutide(0.25 or 0.5MG/DOS) ?DIAL AND INJECT UNDER  THE SKIN 0.5 MG WEEKLY ?Notes to patient: Resume home regimen ?  ?polyethylene glycol 17 g packet ?Commonly known as: MIRALAX / GLYCOLAX ?Take 17 g by mouth daily as needed for mild constipation. ?  ?Simbrinza 1-0.2 % Susp ?Generic drug: Brinzolamide-Brimonidine ?Place 1 drop into both eyes daily. ?Notes to patient: Resume home regimen ?  ?telmisartan 80 MG tablet ?Commonly known as: MICARDIS ?Take 1 tablet (80 mg total) by mouth daily. ?Notes to patient: Resume home regimen ?  ?Vitamin D3 125 MCG (5000 UT) Caps ?Take 5,000 Units by mouth daily. ?Notes to patient: Resume home regimen ?  ? ?  ? ?  ?  ? ? ?  ?Discharge Care Instructions  ?(From admission, onward)  ?  ? ? ?  ? ?  Start     Ordered  ? 03/10/22 0000  Change dressing       ?Comments: Maintain surgical dressing until follow up in the clinic. If the edges start to pull up, may reinforce with tape. If the dressing is no longer working, may remove and cover with gauze and tape, but must keep the area dry and clean.  Call with any questions or concerns.  ? 03/10/22 0751  ? ?  ?  ? ?  ? ? ?Diagnostic Studies: No results found. ? ?Disposition: Discharge disposition: 01-Home or Self Care ? ? ? ? ? ? ?Discharge Instructions   ? ? Call MD / Call 911   Complete by: As directed ?  ? If you experience chest pain or shortness of breath, CALL 911 and be transported to the hospital emergency room.  If you develope a fever above 101 F, pus (white drainage) or increased drainage or redness at the wound, or calf pain, call your surgeon's office.  ? Change dressing   Complete by: As directed ?  ? Maintain surgical dressing until follow up in the clinic. If the edges start to pull up, may reinforce with tape. If the dressing is no longer working, may remove and cover with gauze and tape, but must keep the area dry and clean.  Call with any questions or concerns.  ? Constipation Prevention   Complete by: As directed ?  ? Drink plenty of fluids.  Prune juice may be helpful.   You may use a stool softener, such as Colace (over the counter) 100 mg twice a day.  Use MiraLax (over the counter) for constipation as needed.  ? Diet - low sodium heart healthy   Complete by: As directed ?  ? Increase activity slowly as tolerated   Complete by: As directed ?  ? Weight bearing as tolerated with assist device (walker, cane, etc) as directed, use it as long as suggested by your surgeon or  therapist, typically at least 4-6 weeks.  ? Post-operative opioid taper instructions:   Complete by: As directed ?  ? POST-OPERATIVE OPIOID TAPER INSTRUCTIONS: ?It is important to wean off of your opioid medication as soon as possible. If you do not need pain medication after your surgery it is ok to stop day one. ?Opioids include: ?Codeine, Hydrocodone(Norco, Vicodin), Oxycodone(Percocet, oxycontin) and hydromorphone amongst others.  ?Long term and even short term use of opiods can cause: ?Increased pain response ?Dependence ?Constipation ?Depression ?Respiratory depression ?And more.  ?Withdrawal symptoms can include ?Flu like symptoms ?Nausea, vomiting ?And more ?Techniques to manage these symptoms ?Hydrate well ?Eat regular healthy meals ?Stay active ?Use relaxation techniques(deep breathing, meditating, yoga) ?Do Not substitute Alcohol to help with tapering ?If you have been on opioids for less than two weeks and do not have pain than it is ok to stop all together.  ?Plan to wean off of opioids ?This plan should start within one week post op of your joint replacement. ?Maintain the same interval or time between taking each dose and first decrease the dose.  ?Cut the total daily intake of opioids by one tablet each day ?Next start to increase the time between doses. ?The last dose that should be eliminated is the evening dose.  ? ?  ? TED hose   Complete by: As directed ?  ? Use stockings (TED hose) for 2 weeks on both leg(s).  You may remove them at night for sleeping.  ? ?  ? ? ? Follow-up Information   ? ?  Paralee Cancel, MD. Schedule an appointment as soon as possible for a visit in 2 week(s).   ?Specialty: Orthopedic Surgery ?Contact information: ?[address] ?[address] ?[place] [address] ?[phone]

## 2022-03-19 ENCOUNTER — Other Ambulatory Visit: Payer: Self-pay | Admitting: Nurse Practitioner

## 2022-03-25 LAB — HM DIABETES EYE EXAM

## 2022-04-10 ENCOUNTER — Other Ambulatory Visit: Payer: Self-pay | Admitting: Cardiology

## 2022-04-24 ENCOUNTER — Other Ambulatory Visit: Payer: Self-pay | Admitting: Internal Medicine

## 2022-04-27 ENCOUNTER — Other Ambulatory Visit: Payer: Self-pay | Admitting: Internal Medicine

## 2022-05-17 ENCOUNTER — Other Ambulatory Visit: Payer: Self-pay | Admitting: Internal Medicine

## 2022-06-23 ENCOUNTER — Encounter (INDEPENDENT_AMBULATORY_CARE_PROVIDER_SITE_OTHER): Payer: Self-pay

## 2022-06-29 ENCOUNTER — Telehealth: Payer: Self-pay

## 2022-06-29 NOTE — Telephone Encounter (Signed)
Patient called, patient aware. Ozempic not covered since she is not a diabetic.

## 2022-07-07 ENCOUNTER — Other Ambulatory Visit: Payer: Self-pay

## 2022-08-06 ENCOUNTER — Ambulatory Visit: Payer: BC Managed Care – PPO | Admitting: Student

## 2022-08-21 ENCOUNTER — Other Ambulatory Visit: Payer: Self-pay | Admitting: Nurse Practitioner

## 2022-08-21 MED ORDER — HYDROCODONE-ACETAMINOPHEN 5-325 MG PO TABS: 1.0000 | ORAL_TABLET | Freq: Four times a day (QID) | ORAL | 0 refills | Status: DC | PRN

## 2022-08-21 NOTE — Progress Notes (Signed)
Spoke with patient on call concerned has pain to right low back feels is a kidney stone, has had this to occur twice in the past. I have sent in a limited supply of pain medications and she will need an appt next week for evaluation and likely ultrasound. She is advised to not drive or operate heavy machinery while taking.

## 2022-08-26 ENCOUNTER — Ambulatory Visit (INDEPENDENT_AMBULATORY_CARE_PROVIDER_SITE_OTHER): Payer: BC Managed Care – PPO | Admitting: Emergency Medicine

## 2022-08-26 ENCOUNTER — Ambulatory Visit (INDEPENDENT_AMBULATORY_CARE_PROVIDER_SITE_OTHER): Payer: BC Managed Care – PPO

## 2022-08-26 ENCOUNTER — Encounter: Payer: Self-pay | Admitting: Emergency Medicine

## 2022-08-26 DIAGNOSIS — R0609 Other forms of dyspnea: Secondary | ICD-10-CM | POA: Diagnosis not present

## 2022-08-26 NOTE — Patient Instructions (Signed)
We will perform chest x-ray today We will arrange for pulmonary function testing Depending on your testing results we will decide whether to consider a repeat echocardiogram with Dr. Einar Gip Follow Dr. Lamonte Sakai next available after your pulmonary function testing so we can review the results together.

## 2022-08-26 NOTE — Assessment & Plan Note (Signed)
She has been more short of breath over several months.  This despite the fact that she has lost about 40 pounds.  Her exercise routine was perturbed some by knee replacement over the last several months.  Certainly she could have some progressive disease due to sarcoidosis.  She needs repeat pulmonary function testing and chest x-ray to compare with priors.  Depending on results we can determine whether echocardiogram would be helpful to evaluate for any evidence of evolving PAH.  We will also consider possible repeat high-resolution CT scan of the chest depending on results.  We will perform chest x-ray today We will arrange for pulmonary function testing Depending on your testing results we will decide whether to consider a repeat echocardiogram with Dr. Einar Gip Follow Dr. Lamonte Sakai next available after your pulmonary function testing so we can review the results together.

## 2022-08-26 NOTE — Progress Notes (Signed)
   Subjective:    Patient ID: NUR RABOLD, female    DOB: 09/19/1951, 71 y.o.   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  She has slow progressive dyspnea over several years time.  CT chest 01/25/2020 reassuring without any evidence of interstitial disease as above.  We repeated her pulmonary function testing today which I have reviewed, shows normal lung volumes, normal spirometry without a bronchodilator response, normal DLCO.  Her FEV1 is 112% predicted, stable compared with 11/06/2018.  High-resolution CT scan of the chest done on 06/20/2020 reviewed by me, shows no evidence of interstitial lung disease possible patchy air trapping, moderate bilateral hilar mediastinal lymphadenopathy with some suspected calcification, scattered perilymphatic solid pulmonary nodules consistent with sarcoid. She is still working, is quite busy. Still has exertional SOB, stable. Minimal cough, does have to clear some mucous most mornings. Her exertional SOB is less frequent, she is working on losing some wt.   MDM reviewed office notes from Dr. 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  She has some calcified mediastinal adenopathy on chest imaging but no overt interstitial lung disease. She has lost about 40 lbs since I last saw her. She still gets fatigued w exertion. She is having more exertional SOB than last time I saw her. Her last TTE was 01/2021, her last CT and PFT were 2 yrs ago.  No rash, minimal cough.  No visual changes.   Review of Systems As per HPI     Objective:   Physical Exam  Vitals:   08/26/22 1455  BP: 132/78  Pulse: 78  Temp: 97.7 F (36.5 C)  TempSrc: Oral  SpO2: 98%  Weight: 218 lb 6.4 oz (99.1 kg)  Height: 5' 5.5" (1.664 m)   Gen: Pleasant, overwt  woman, in no distress,  normal affect  ENT: No lesions,  mouth clear,  oropharynx clear, no postnasal drip  Neck: No JVD, no stridor  Lungs: No use of accessory muscles, no crackles or wheezing on normal respiration, no wheeze on forced expiration  Cardiovascular: RRR, heart sounds normal, no murmur or gallops, no peripheral edema  Musculoskeletal: No deformities, no cyanosis or clubbing  Neuro: alert, awake, non focal  Skin: Warm, no lesions or rash      Assessment & Plan:  DOE (dyspnea on exertion) She has been more short of breath over several months.  This despite the fact that she has lost about 40 pounds.  Her exercise routine was perturbed some by knee replacement over the last several months.  Certainly she could have some progressive disease due to sarcoidosis.  She needs repeat pulmonary function testing and chest x-ray to compare with priors.  Depending on results we can determine whether echocardiogram would be helpful to evaluate for any evidence of evolving PAH.  We will also consider possible repeat high-resolution CT scan of the chest depending on results.  We will perform chest x-ray today We will arrange for pulmonary function testing Depending on your testing results we will decide whether to consider a repeat echocardiogram with Dr. Einar Gip Follow Dr. Lamonte Sakai next available after your pulmonary function testing so we can review the results together.  Baltazar Apo, MD, PhD 08/26/2022, 3:18 PM [facility] [phone] or if no answer [phone]

## 2022-08-26 NOTE — Addendum Note (Signed)
Addended byOralia Rud M on: 08/26/2022 03:39 PM   Modules accepted: Orders

## 2022-09-07 ENCOUNTER — Ambulatory Visit: Payer: BC Managed Care – PPO | Admitting: Internal Medicine

## 2022-09-07 ENCOUNTER — Encounter: Payer: Self-pay | Admitting: Internal Medicine

## 2022-09-07 VITALS — BP 145/87 | HR 77 | Ht 65.5 in | Wt 219.2 lb

## 2022-09-07 DIAGNOSIS — I10 Essential (primary) hypertension: Secondary | ICD-10-CM

## 2022-09-07 DIAGNOSIS — R0609 Other forms of dyspnea: Secondary | ICD-10-CM | POA: Insufficient documentation

## 2022-09-07 DIAGNOSIS — E782 Mixed hyperlipidemia: Secondary | ICD-10-CM

## 2022-09-07 NOTE — Progress Notes (Signed)
Primary Physician/Referring:  Glendale Chard, MD  Patient ID: Kristine [name], female    DOB: Apr 12, 1951, 71 y.o.   MRN: [id number]  Chief Complaint  Patient presents with   Shortness of Breath   HPI:    Kristine [name]  is a 71 y.o. 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 Christinia Gully, morbid obesity, severe obstructive sleep apnea on CPAP since August 2018 and follows Dr. Roddie Mc, hypertension, mild hyperlipidemia,  prediabetes, positive RNP antibody and also elevated ANA, had non-ischemic stress test in May 2019. Due to worsening dyspnea on exertion and abnormal EKG with marked T wave abnormality in anterolateral leads, underwent coronary CTA & repeat echocardiogram in 2021 revealing no obstructive CAD normal LVEF.   Patient is here for a follow-up visit. She has noticed she is having chest/arm pain and shortness of breath with activity. She is likely going to try and get her knee fixed before the end of the year but patient will need testing to rule out ischemia prior to surgery given her current symptoms.  Denies leg swelling, orthopnea, PND, syncope, near syncope, palpitations.  Past Medical History:  Diagnosis Date   Allergy    Arthritis    Asthma    Back pain    Complication of anesthesia    slow to wake up   Dyspnea    Glaucoma    ROD   Heart murmur    Hypertension    Joint pain    Knee pain    Obesity    Sarcoidosis    SOB (shortness of breath)    Past Surgical History:  Procedure Laterality Date   ABDOMINAL HYSTERECTOMY     BUNIONECTOMY     left   CHOLECYSTECTOMY     KNEE SURGERY     right   LEFT HEART CATHETERIZATION WITH CORONARY ANGIOGRAM N/A 09/12/2014   Procedure: LEFT HEART CATHETERIZATION WITH CORONARY ANGIOGRAM;  Surgeon: Troy Sine, MD;  Location: [facility] CATH LAB;  Service: Cardiovascular;  Laterality: N/A;   TOTAL KNEE ARTHROPLASTY Right 03/09/2022   Procedure: TOTAL KNEE ARTHROPLASTY;  Surgeon: Paralee Cancel, MD;   Location: [facility] ORS;  Service: Orthopedics;  Laterality: Right;   Family History  Problem Relation Age of Onset   Breast cancer Mother    Lung cancer Mother    Lung cancer Father        smoked   Heart disease Maternal Grandmother    Hypertension Brother    Liver cancer Brother    Pneumonia Brother    Social History   Tobacco Use   Smoking status: Never   Smokeless tobacco: Never  Substance Use Topics   Alcohol use: Yes    Comment: occ  Marital Status: Married  ROS  Review of Systems  Cardiovascular:  Positive for chest pain and dyspnea on exertion. Negative for claudication, leg swelling, near-syncope, orthopnea, palpitations, paroxysmal nocturnal dyspnea and syncope.  Gastrointestinal:  Negative for melena.  Neurological:  Negative for dizziness.    Objective  Blood pressure (!) 145/87, pulse 77, height 5' 5.5" (1.664 m), weight 219 lb 3.2 oz (99.4 kg), SpO2 99 %.     09/07/2022   10:36 AM 08/26/2022    2:55 PM 03/10/2022    3:45 PM  Vitals with BMI  Height 5' 5.5" 5' 5.5"   Weight 219 lbs 3 oz 218 lbs 6 oz   BMI 16.94 50.38   Systolic 882 800 349  Diastolic 87 78 81  Pulse 77  78 85     Physical Exam Vitals reviewed.  Neck:     Thyroid: No thyromegaly.  Cardiovascular:     Rate and Rhythm: Normal rate and regular rhythm.     Pulses: Intact distal pulses.     Heart sounds: Normal heart sounds, S1 normal and S2 normal. No murmur heard.    No gallop.     Comments: No leg edema, no JVD. Pulmonary:     Effort: Pulmonary effort is normal. No respiratory distress.     Breath sounds: Normal breath sounds. No wheezing, rhonchi or rales.  Abdominal:     Comments: Obese and pannus is present.  Musculoskeletal:     Right lower leg: No edema.     Left lower leg: No edema.  Skin:    General: Skin is warm and dry.  Neurological:     Mental Status: She is alert.    Laboratory examination:   Recent Labs    11/12/21 1501 02/24/22 1349 03/10/22 0315  NA 141 140  141  K 4.1 4.0 3.8  CL 105 110 110  CO2 _0 GLUCOSE 84 88 124*  BUN _1 CREATININE 0.89 0.70 1.01*  CALCIUM 9.5 9.7 8.6*  GFRNONAA  --  >60 >60   CrCl cannot be calculated (Patient's most recent lab result is older than the maximum 21 days allowed.).     Latest Ref Rng & Units 03/10/2022    3:15 AM 02/24/2022    1:49 PM 11/12/2021    3:01 PM  CMP  Glucose 70 - 99 mg/dL 124  88  84   BUN 8 - 23 mg/dL _2 Creatinine 0.44 - 1.00 mg/dL 1.01  0.70  0.89   Sodium 135 - 145 mmol/L 141  140  141   Potassium 3.5 - 5.1 mmol/L 3.8  4.0  4.1   Chloride 98 - 111 mmol/L 110  110  105   CO2 22 - 32 mmol/L _3 Calcium 8.9 - 10.3 mg/dL 8.6  9.7  9.5   Total Protein 6.5 - 8.1 g/dL  7.7  6.7   Total Bilirubin 0.3 - 1.2 mg/dL  0.3  <0.2   Alkaline Phos 38 - 126 U/L  49  65   AST 15 - 41 U/L  17  14   ALT 0 - 44 U/L  17  17       Latest Ref Rng & Units 03/10/2022    3:15 AM 02/24/2022    1:49 PM 11/12/2021    3:01 PM  CBC  WBC 4.0 - 10.5 K/uL 11.6  6.9  7.5   Hemoglobin 12.0 - 15.0 g/dL 9.6  12.0  11.6   Hematocrit 36.0 - 46.0 % 29.3  38.2  35.9   Platelets 150 - 400 K/uL 180  243  260    Lipid Panel     Component Value Date/Time   CHOL 130 11/12/2021 1501   TRIG 82 11/12/2021 1501   HDL 57 11/12/2021 1501   CHOLHDL 2.3 11/12/2021 1501   CHOLHDL 4.1 04/05/2013 0858   VLDL 15 04/05/2013 0858   LDLCALC 57 11/12/2021 1501   HEMOGLOBIN A1C Lab Results  Component Value Date   HGBA1C 5.3 02/09/2022   MPG 117 (H) 07/11/2014   TSH No results for input(s): "TSH" in the last 8760 hours.  BNP    Component Value Date/Time   BNP 17.4 01/14/2021  1616    ProBNP    Component Value Date/Time   PROBNP 34.0 07/30/2016 1623     External labs  03/14/2018: Cholesterol 175, triglycerides 78, HDL 47, LDL 112. Sedimentation rate 16.  12/27/2017: Hemoglobin A1c 6.2%. Creatinine 0.92, EGFR 76/66, potassium 4.0, BMP normal. Vitamin B12 308.  05/02/2017: Anti--DNA  less than 1. RNP antibodies 1.1 elevated. Sjorgens negative. ANA direct positive. TSH 2.6. Vitamin D 39.  03/07/2017: Cholesterol 183, triglycerides 66, HDL 56, LDL 114.  Allergies  No Known Allergies   Medications Prior to Visit:   Outpatient Medications Prior to Visit  Medication Sig Dispense Refill   albuterol (VENTOLIN HFA) 108 (90 Base) MCG/ACT inhaler INHALE TWO PUFFS BY MOUTH EVERY 6 HOURS AS NEEDED FOR WHEEZING OR SHORTNESS OF BREATH 8.5 g 1   ASPIRIN 81 PO Take 1 tablet by mouth daily.     atorvastatin (LIPITOR) 10 MG tablet TAKE ONE TABLET BY MOUTH DAILY 90 tablet 1   Brinzolamide-Brimonidine (SIMBRINZA) 1-0.2 % SUSP Place 1 drop into both eyes daily.     CALCIUM PO Take 1 tablet by mouth daily.     Cholecalciferol (VITAMIN D3) 5000 UNITS CAPS Take 5,000 Units by mouth daily.     Magnesium 400 MG CAPS Take 400 mg by mouth daily.     metoprolol succinate (TOPROL-XL) 25 MG 24 hr tablet TAKE ONE TABLET BY MOUTH DAILY 90 tablet 1   telmisartan (MICARDIS) 80 MG tablet TAKE ONE TABLET BY MOUTH DAILY 90 tablet 1   celecoxib (CELEBREX) 200 MG capsule Take 1 capsule (200 mg total) by mouth daily. (Patient not taking: Reported on 08/26/2022) 30 capsule 0   docusate sodium (COLACE) 100 MG capsule Take 1 capsule (100 mg total) by mouth 2 (two) times daily. (Patient not taking: Reported on 09/07/2022) 10 capsule 0   HYDROcodone-acetaminophen (NORCO/VICODIN) 5-325 MG tablet Take 1 tablet by mouth every 6 (six) hours as needed. (Patient not taking: Reported on 09/07/2022) 10 tablet 0   methocarbamol (ROBAXIN) 500 MG tablet Take 1 tablet (500 mg total) by mouth every 6 (six) hours as needed for muscle spasms. 40 tablet 0   polyethylene glycol (MIRALAX / GLYCOLAX) 17 g packet Take 17 g by mouth daily as needed for mild constipation. (Patient not taking: Reported on 08/26/2022) 14 each 0   No facility-administered medications prior to visit.   Final Medications at End of Visit    Current Meds   Medication Sig   albuterol (VENTOLIN HFA) 108 (90 Base) MCG/ACT inhaler INHALE TWO PUFFS BY MOUTH EVERY 6 HOURS AS NEEDED FOR WHEEZING OR SHORTNESS OF BREATH   ASPIRIN 81 PO Take 1 tablet by mouth daily.   atorvastatin (LIPITOR) 10 MG tablet TAKE ONE TABLET BY MOUTH DAILY   Brinzolamide-Brimonidine (SIMBRINZA) 1-0.2 % SUSP Place 1 drop into both eyes daily.   CALCIUM PO Take 1 tablet by mouth daily.   Cholecalciferol (VITAMIN D3) 5000 UNITS CAPS Take 5,000 Units by mouth daily.   Magnesium 400 MG CAPS Take 400 mg by mouth daily.   metoprolol succinate (TOPROL-XL) 25 MG 24 hr tablet TAKE ONE TABLET BY MOUTH DAILY   telmisartan (MICARDIS) 80 MG tablet TAKE ONE TABLET BY MOUTH DAILY   Radiology:   Chest x-ray PA and lateral view 12/08/2017: No acute pneumonia nor CHF. Bilateral hilar lymphadenopathy and mild chronic interstitial prominence consistent with known sarcoidosis.  Cardiac Studies:   Coronary angiogram 09/12/2014: Normal LV systolic function at 19%, 40% ostial stenosis of the LAD, otherwise  normal coronary arteries.  Exercise sestamibi stress test 03/20/2018: 1. The patient performed treadmill exercise using a Bruce protocol, completing 4:49 minutes. The patient completed an estimated workload of 6.81 METS, reaching 105% of the maximum predicted heart rate. No stress symptoms reported. Peak BP 220/98 mmHg. No ischemic changes seen on stress electrocardiogram. 2. The overall quality of the study is good. There is no evidence of abnormal lung activity. Stress and rest SPECT images demonstrate homogeneous tracer distribution throughout the myocardium. Gated SPECT imaging reveals normal myocardial thickening and wall motion. The left ventricular ejection fraction was normal (56%). 3. Low risk study.  Echocardiogram 12/07/2019:  Left ventricle cavity is normal in size and wall thickness. Normal LV systolic function with EF 56%. Normal global wall motion. Unable to evaluate diastolic  function due to severity of mitral annular calcification.  Calculated EF 56%.  Left atrial cavity is severely dilated.  Severe calcification of the mitral valve annulus. Mild (Grade I) mitral regurgitation.  Mild tricuspid regurgitation. Estimated pulmonary artery systolic pressure is 30 mmHg.  No significant change compared to previous study on 04/04/2018.   Coronary CTA 01/25/2020:  1. Coronary calcium score of 201. This was 89th percentile for age and sex matched control. 2. Normal coronary origin with right dominance. 3.  Mild (25-49%) calcified plaque in a small D1. 4.  Moderate (50-69%) mixed plaque in the mid LCX and OM2. 5.  Will send study for FFRct. 6.  Moderate mitral annular calcification.  FFR findings consistent with nonobstructive coronary artery disease.  Extracardiac findings: Extensive mediastinal and bilateral hilar lymphadenopathy, compatible with reported clinical history of sarcoidosis. A few scattered tiny 2-4 mm pulmonary nodules noted in the lungs bilaterally, nonspecific, but statistically likely benign in this patient with history of sarcoidosis.   PCV ECHOCARDIOGRAM COMPLETE 75/91/6384 Normal LV systolic function with visual EF 55-60%. Left ventricle cavity is normal in size. The left ventricle is normal thickness, septal bulge. Normal global wall motion. Unable to evaluate diastolic function due to severity of mitral annular  calcification. Elevated LAP. Severe calcification of the mitral valve annulus. Native mitral valve with trace regurgitation. No evidence of mitral valve stenosis. Compared to prior study dated 12/07/2019 no significant change.  EKG   09/07/2022: Sinus Rhythm Left axis -anterior fascicular block. LVH criteria met, iLBBB. No significant change compared to prior  02/03/2022: Sinus rhythm with borderline first-degree AV block at a rate of 84 bpm.  Left axis, left anterior fascicular block.  LVH with secondary ST-T wave abnormalities, cannot  exclude anterolateral ischemia.  Unchanged compared to EKG 12/26/2019, 01/13/2021.  EKG 01/13/2021: Sinus rhythm rate of 76 bpm.  Left axis, left anterior fascicular block.  Poor progression, cannot exclude anteroseptal infarct old.  LVH with secondary ST-T abnormalities, cannot exclude anterolateral ischemia.  EKG 12/26/2019: Normal sinus rhythm with rate of 67 bpm, left axis deviation, left anterior fascicular block.  IVCD, incomplete left bundle branch block.  T wave normality, cannot exclude anterolateral ischemia.   Compared to EKG 03/13/2018, anterolateral T inversion new.  Assessment     ICD-10-CM   1. Dyspnea on exertion  R06.09 EKG 12-Lead    PCV ECHOCARDIOGRAM COMPLETE    PCV MYOCARDIAL PERFUSION WO LEXISCAN    2. Mixed hyperlipidemia  E78.2 PCV ECHOCARDIOGRAM COMPLETE    PCV MYOCARDIAL PERFUSION WO LEXISCAN    3. Essential hypertension  I10 PCV ECHOCARDIOGRAM COMPLETE    PCV MYOCARDIAL PERFUSION WO LEXISCAN       No orders of the defined types  were placed in this encounter.   There are no discontinued medications.    Recommendations:   JEANITA CARNEIRO  is a 71 y.o. 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 Christinia Gully, morbid obesity, severe obstructive sleep apnea on CPAP since August 2018 and follows Dr. Roddie Mc, hypertension, mild hyperlipidemia,  prediabetes, positive RNP antibody and also elevated ANA, had non-ischemic stress test in May 2019. Due to worsening dyspnea on exertion and abnormal EKG with marked T wave abnormality in anterolateral leads, underwent coronary CTA & repeat echocardiogram in 2021 revealing no obstructive CAD normal LVEF.   Dyspnea on exertion Stress test and echo ordered  Mixed hyperlipidemia Continue lipitor  Essential hypertension Continue current cardiac medications. Encourage low-sodium diet, less than 2000 mg daily.     Follow-up in 6 months, sooner if needed.   Floydene Flock, DO, [facility] 09/07/2022, 1:51  PM Office: [phone]

## 2022-09-22 ENCOUNTER — Ambulatory Visit: Payer: BC Managed Care – PPO | Admitting: Internal Medicine

## 2022-09-22 ENCOUNTER — Encounter: Payer: Self-pay | Admitting: Internal Medicine

## 2022-09-22 VITALS — BP 120/78 | HR 74 | Temp 98.3°F | Ht 65.4 in | Wt 213.0 lb

## 2022-09-22 DIAGNOSIS — I25118 Atherosclerotic heart disease of native coronary artery with other forms of angina pectoris: Secondary | ICD-10-CM | POA: Diagnosis not present

## 2022-09-22 DIAGNOSIS — I700 Atherosclerosis of aorta: Secondary | ICD-10-CM | POA: Diagnosis not present

## 2022-09-22 DIAGNOSIS — Z23 Encounter for immunization: Secondary | ICD-10-CM

## 2022-09-22 DIAGNOSIS — Z862 Personal history of diseases of the blood and blood-forming organs and certain disorders involving the immune mechanism: Secondary | ICD-10-CM

## 2022-09-22 DIAGNOSIS — I119 Hypertensive heart disease without heart failure: Secondary | ICD-10-CM | POA: Diagnosis not present

## 2022-09-22 DIAGNOSIS — E6609 Other obesity due to excess calories: Secondary | ICD-10-CM

## 2022-09-22 DIAGNOSIS — Z6835 Body mass index (BMI) 35.0-35.9, adult: Secondary | ICD-10-CM

## 2022-09-22 DIAGNOSIS — I2510 Atherosclerotic heart disease of native coronary artery without angina pectoris: Secondary | ICD-10-CM

## 2022-09-22 DIAGNOSIS — Z01818 Encounter for other preprocedural examination: Secondary | ICD-10-CM

## 2022-09-22 MED ORDER — TELMISARTAN 80 MG PO TABS: 80.0000 mg | ORAL_TABLET | Freq: Every day | ORAL | 1 refills | Status: DC

## 2022-09-22 NOTE — Progress Notes (Signed)
I,Jameka J Llittleton,acting as a scribe for  N , MD.,have documented all relevant documentation on the behalf of  N , MD,as directed by   N , MD while in the presence of  N , MD.    Subjective:     Patient ID: Kristine [name] , female    DOB: 08/30/1951 , 70 y.o.   MRN: [id number]   Chief Complaint  Patient presents with   Pre-op Exam    HPI  Patient presents today for a pre op exam. She was last seen in March 2023 for BP check. She plans to move forward with TKR in December due to progressively worsening knee pain. Pain has made it difficult to her to participate in a regular exercise regimen. She is scheduled to have a knee replacement on 12/26. She reports compliance with meds. Unfortunately, has had persistent SOB. She is followed by both Pulmonary and Cardiology.  She is currently undergoing workup by both specialists to determine if she is stable to move forward with surgery.   Hypertension This is a chronic problem. The current episode started more than 1 year ago. The problem has been gradually improving since onset. The problem is controlled. Associated symptoms include shortness of breath. Pertinent negatives include no blurred vision, chest pain or palpitations. Risk factors for coronary artery disease include sedentary lifestyle, obesity and post-menopausal state. The current treatment provides moderate improvement.     Past Medical History:  Diagnosis Date   Allergy    Arthritis    Asthma    Back pain    Complication of anesthesia    slow to wake up   Dyspnea    Glaucoma    ROD   Heart murmur    Hypertension    Joint pain    Knee pain    Obesity    Sarcoidosis    SOB (shortness of breath)      Family History  Problem Relation Age of Onset   Breast cancer Mother    Lung cancer Mother    Lung cancer Father        smoked   Heart disease Maternal Grandmother    Hypertension Brother    Liver cancer Brother     Pneumonia Brother      Current Outpatient Medications:    albuterol (VENTOLIN HFA) 108 (90 Base) MCG/ACT inhaler, INHALE TWO PUFFS BY MOUTH EVERY 6 HOURS AS NEEDED FOR WHEEZING OR SHORTNESS OF BREATH, Disp: 8.5 g, Rfl: 1   ASPIRIN 81 PO, Take 1 tablet by mouth daily., Disp: , Rfl:    Brinzolamide-Brimonidine (SIMBRINZA) 1-0.2 % SUSP, Place 1 drop into both eyes daily., Disp: , Rfl:    CALCIUM PO, Take 1 tablet by mouth daily., Disp: , Rfl:    Cholecalciferol (VITAMIN D3) 5000 UNITS CAPS, Take 5,000 Units by mouth daily., Disp: , Rfl:    Magnesium 400 MG CAPS, Take 400 mg by mouth daily., Disp: , Rfl:    metoprolol succinate (TOPROL-XL) 25 MG 24 hr tablet, TAKE ONE TABLET BY MOUTH DAILY, Disp: 90 tablet, Rfl: 1   isosorbide mononitrate (IMDUR) 30 MG 24 hr tablet, Take 1 tablet (30 mg total) by mouth daily., Disp: 90 tablet, Rfl: 3   Multiple Vitamin (MULTIVITAMIN) capsule, Take 1 capsule by mouth daily., Disp: , Rfl:    rosuvastatin (CRESTOR) 20 MG tablet, Take 1 tablet (20 mg total) by mouth daily., Disp: 90 tablet, Rfl: 3   telmisartan (MICARDIS) 80 MG tablet, Take 1 tablet (80   mg total) by mouth daily., Disp: 90 tablet, Rfl: 1   No Known Allergies   Review of Systems  Constitutional: Negative.   Eyes: Negative.  Negative for blurred vision.  Respiratory:  Positive for shortness of breath.   Cardiovascular: Negative.  Negative for chest pain and palpitations.  Gastrointestinal: Negative.   Musculoskeletal: Negative.   Skin: Negative.   Neurological: Negative.   Psychiatric/Behavioral: Negative.       Today's Vitals   09/22/22 1130  BP: 120/78  Pulse: 74  Temp: 98.3 F (36.8 C)  Weight: 213 lb (96.6 kg)  Height: 5' 5.4" (1.661 m)  PainSc: 0-No pain   Body mass index is 35.01 kg/m.  Wt Readings from Last 3 Encounters:  10/12/22 217 lb (98.4 kg)  09/22/22 213 lb (96.6 kg)  09/07/22 219 lb 3.2 oz (99.4 kg)     Objective:  Physical Exam Vitals and nursing note reviewed.   Constitutional:      Appearance: Normal appearance. She is obese.  HENT:     Head: Normocephalic and atraumatic.     Nose:     Comments: Masked     Mouth/Throat:     Comments: Masked  Eyes:     Extraocular Movements: Extraocular movements intact.  Cardiovascular:     Rate and Rhythm: Normal rate and regular rhythm.     Heart sounds: Normal heart sounds.  Pulmonary:     Effort: Pulmonary effort is normal.     Breath sounds: Normal breath sounds.  Musculoskeletal:     Cervical back: Normal range of motion.  Skin:    General: Skin is warm.  Neurological:     General: No focal deficit present.     Mental Status: She is alert.  Psychiatric:        Mood and Affect: Mood normal.        Behavior: Behavior normal.      Assessment And Plan:     1. Hypertensive heart disease without heart failure Comments: Chronic, well controlled. Encouraged to follow a low sodium diet. No med changes are needed at this time. - CBC no Diff - CMP14+EGFR  2. Coronary artery disease involving native coronary artery of native heart with other form of angina pectoris ([place]) Comments: Chronic, LDL goal <70. Importance of dietary/medication compliance was d/w patient. Cardiac input appreciated.  3. Aortic atherosclerosis (HCC) Comments: Chronic, please see above.  4. Class 2 severe obesity due to excess calories with serious comorbidity and body mass index (BMI) of 35.0 to 35.9 in adult [facility]) Comments: She is now on weekly semaglutide by another provider. She was congratlulated on her weight loss thus far.  5. History of anemia Comments: I will check CBC today.  If present, this could be contributing to her shortness of breath. - CBC no Diff  6. Pre-op exam Comments: EKG not performed, she had one performed Oct 2023 by Cardiology. Cardio/Pulmonary eval need to be completed prior to clearance.  7. Immunization due - Flu Vaccine QUAD High Dose(Fluad)   Patient was given opportunity to ask  questions. Patient verbalized understanding of the plan and was able to repeat key elements of the plan. All questions were answered to their satisfaction.   I, [name], MD, have reviewed all documentation for this visit. The documentation on 09/22/22 for the exam, diagnosis, procedures, and orders are all accurate and complete.   IF YOU HAVE BEEN REFERRED TO A SPECIALIST, IT MAY TAKE 1-2 WEEKS TO SCHEDULE/PROCESS THE REFERRAL. IF  YOU HAVE NOT HEARD FROM US/SPECIALIST IN TWO WEEKS, PLEASE GIVE US A CALL AT [phone] X [phone].   THE PATIENT IS ENCOURAGED TO PRACTICE SOCIAL DISTANCING DUE TO THE COVID-19 PANDEMIC.   

## 2022-09-23 LAB — CMP14+EGFR
ALT: 11 IU/L (ref 0–32)
AST: 15 IU/L (ref 0–40)
Albumin/Globulin Ratio: 1.7 (ref 1.2–2.2)
Albumin: 4.4 g/dL (ref 3.9–4.9)
Alkaline Phosphatase: 62 IU/L (ref 44–121)
BUN/Creatinine Ratio: 21 (ref 12–28)
BUN: 18 mg/dL (ref 8–27)
Bilirubin Total: 0.3 mg/dL (ref 0.0–1.2)
CO2: 22 mmol/L (ref 20–29)
Calcium: 9.5 mg/dL (ref 8.7–10.3)
Chloride: 104 mmol/L (ref 96–106)
Creatinine, Ser: 0.84 mg/dL (ref 0.57–1.00)
Globulin, Total: 2.6 g/dL (ref 1.5–4.5)
Glucose: 78 mg/dL (ref 70–99)
Potassium: 4.3 mmol/L (ref 3.5–5.2)
Sodium: 144 mmol/L (ref 134–144)
Total Protein: 7 g/dL (ref 6.0–8.5)
eGFR: 75 mL/min/{1.73_m2} (ref >59)

## 2022-09-23 LAB — CBC
Hematocrit: 37.8 % (ref 34.0–46.6)
Hemoglobin: 12.2 g/dL (ref 11.1–15.9)
MCH: 26.9 pg (ref 26.6–33.0)
MCHC: 32.3 g/dL (ref 31.5–35.7)
MCV: 83 fL (ref 79–97)
Platelets: 234 10*3/uL (ref 150–450)
RBC: 4.54 x10E6/uL (ref 3.77–5.28)
RDW: 13 % (ref 11.7–15.4)
WBC: 6.7 10*3/uL (ref 3.4–10.8)

## 2022-09-26 ENCOUNTER — Other Ambulatory Visit: Payer: Self-pay | Admitting: Internal Medicine

## 2022-09-26 MED ORDER — NIRMATRELVIR/RITONAVIR (PAXLOVID)TABLET: 3.0000 | ORAL_TABLET | Freq: Two times a day (BID) | ORAL | 0 refills | Status: AC

## 2022-10-04 ENCOUNTER — Ambulatory Visit: Payer: BC Managed Care – PPO

## 2022-10-04 DIAGNOSIS — R0609 Other forms of dyspnea: Secondary | ICD-10-CM

## 2022-10-04 DIAGNOSIS — E782 Mixed hyperlipidemia: Secondary | ICD-10-CM

## 2022-10-04 DIAGNOSIS — I10 Essential (primary) hypertension: Secondary | ICD-10-CM

## 2022-10-11 NOTE — Progress Notes (Signed)
DONE

## 2022-10-11 NOTE — Progress Notes (Signed)
Can we schedule her sooner, needs cath

## 2022-10-12 ENCOUNTER — Other Ambulatory Visit: Payer: Self-pay | Admitting: Internal Medicine

## 2022-10-12 ENCOUNTER — Encounter: Payer: Self-pay | Admitting: Internal Medicine

## 2022-10-12 ENCOUNTER — Ambulatory Visit: Payer: BC Managed Care – PPO | Admitting: Internal Medicine

## 2022-10-12 VITALS — BP 140/85 | HR 78 | Ht 65.0 in | Wt 217.0 lb

## 2022-10-12 DIAGNOSIS — R0609 Other forms of dyspnea: Secondary | ICD-10-CM

## 2022-10-12 DIAGNOSIS — E7800 Pure hypercholesterolemia, unspecified: Secondary | ICD-10-CM

## 2022-10-12 DIAGNOSIS — I10 Essential (primary) hypertension: Secondary | ICD-10-CM

## 2022-10-12 MED ORDER — ROSUVASTATIN CALCIUM 20 MG PO TABS: 20.0000 mg | ORAL_TABLET | Freq: Every day | ORAL | 3 refills | Status: DC

## 2022-10-12 MED ORDER — ISOSORBIDE MONONITRATE ER 30 MG PO TB24: 30.0000 mg | ORAL_TABLET | Freq: Every day | ORAL | 3 refills | Status: DC

## 2022-10-12 NOTE — Progress Notes (Signed)
Primary Physician/Referring:  Glendale Chard, MD  Patient ID: Madelon Lips, female    DOB: 04/10/1951, 71 y.o.   MRN: [id number]  Chief Complaint  Patient presents with   Follow-up    Test results   Hypertension   Coronary Artery Disease   HPI:    CELESE BANNER  is a 71 y.o. 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 Christinia Gully, morbid obesity, severe obstructive sleep apnea on CPAP since August 2018 and follows Dr. Roddie Mc, hypertension, mild hyperlipidemia,  prediabetes, positive RNP antibody and also elevated ANA, had non-ischemic stress test in May 2019. Due to worsening dyspnea on exertion and abnormal EKG with marked T wave abnormality in anterolateral leads, underwent coronary CTA & repeat echocardiogram in 2021 revealing moderate non-obstructive CAD normal LVEF.   Patient is here for a follow-up visit. She has noticed she is having chest/arm pain and shortness of breath with activity. She is supposed to have knee surgery at the end of December but her stress test was grossly positive for ischemia and she will need heart catheterization prior to undergoing surgery especially given her exertional symptoms.  Denies leg swelling, orthopnea, PND, syncope, near syncope, palpitations.  Past Medical History:  Diagnosis Date   Allergy    Arthritis    Asthma    Back pain    Complication of anesthesia    slow to wake up   Dyspnea    Glaucoma    ROD   Heart murmur    Hypertension    Joint pain    Knee pain    Obesity    Sarcoidosis    SOB (shortness of breath)    Past Surgical History:  Procedure Laterality Date   ABDOMINAL HYSTERECTOMY     BUNIONECTOMY     left   CHOLECYSTECTOMY     KNEE SURGERY     right   LEFT HEART CATHETERIZATION WITH CORONARY ANGIOGRAM N/A 09/12/2014   Procedure: LEFT HEART CATHETERIZATION WITH CORONARY ANGIOGRAM;  Surgeon: Troy Sine, MD;  Location: [facility] CATH LAB;  Service: Cardiovascular;  Laterality: N/A;   TOTAL  KNEE ARTHROPLASTY Right 03/09/2022   Procedure: TOTAL KNEE ARTHROPLASTY;  Surgeon: Paralee Cancel, MD;  Location: [facility] ORS;  Service: Orthopedics;  Laterality: Right;   Family History  Problem Relation Age of Onset   Breast cancer Mother    Lung cancer Mother    Lung cancer Father        smoked   Heart disease Maternal Grandmother    Hypertension Brother    Liver cancer Brother    Pneumonia Brother    Social History   Tobacco Use   Smoking status: Never   Smokeless tobacco: Never  Substance Use Topics   Alcohol use: Yes    Comment: occ  Marital Status: Married  ROS  Review of Systems  Cardiovascular:  Positive for chest pain and dyspnea on exertion. Negative for claudication, leg swelling, near-syncope, orthopnea, palpitations, paroxysmal nocturnal dyspnea and syncope.  Gastrointestinal:  Negative for melena.  Neurological:  Negative for dizziness.    Objective  Blood pressure (!) 140/85, pulse 78, height 5' 5" (1.651 m), weight 217 lb (98.4 kg), SpO2 100 %.     10/12/2022   11:30 AM 09/22/2022   11:30 AM 09/07/2022   10:36 AM  Vitals with BMI  Height 5' 5" 5' 5.4" 5' 5.5"  Weight 217 lbs 213 lbs 219 lbs 3 oz  BMI 36.11 40.98 11.91  Systolic 478  878 676  Diastolic 85 78 87  Pulse 78 74 77     Physical Exam Vitals reviewed.  Neck:     Thyroid: No thyromegaly.  Cardiovascular:     Rate and Rhythm: Normal rate and regular rhythm.     Pulses: Intact distal pulses.     Heart sounds: Normal heart sounds, S1 normal and S2 normal. No murmur heard.    No gallop.     Comments: No leg edema, no JVD. Pulmonary:     Effort: Pulmonary effort is normal. No respiratory distress.     Breath sounds: Normal breath sounds. No wheezing, rhonchi or rales.  Abdominal:     Comments: Obese and pannus is present.  Musculoskeletal:     Right lower leg: No edema.     Left lower leg: No edema.  Skin:    General: Skin is warm and dry.  Neurological:     Mental Status: She is alert.     Laboratory examination:   Recent Labs    02/24/22 1349 03/10/22 0315 09/22/22 1219  NA 140 141 144  K 4.0 3.8 4.3  CL 110 110 104  CO2 _0 GLUCOSE 88 124* 78  BUN _1 CREATININE 0.70 1.01* 0.84  CALCIUM 9.7 8.6* 9.5  GFRNONAA >60 >60  --    estimated creatinine clearance is 73.4 mL/min (by C-G formula based on SCr of 0.84 mg/dL).     Latest Ref Rng & Units 09/22/2022   12:19 PM 03/10/2022    3:15 AM 02/24/2022    1:49 PM  CMP  Glucose 70 - 99 mg/dL 78  124  88   BUN 8 - 27 mg/dL _2 Creatinine 0.57 - 1.00 mg/dL 0.84  1.01  0.70   Sodium 134 - 144 mmol/L 144  141  140   Potassium 3.5 - 5.2 mmol/L 4.3  3.8  4.0   Chloride 96 - 106 mmol/L 104  110  110   CO2 20 - 29 mmol/L _3 Calcium 8.7 - 10.3 mg/dL 9.5  8.6  9.7   Total Protein 6.0 - 8.5 g/dL 7.0   7.7   Total Bilirubin 0.0 - 1.2 mg/dL 0.3   0.3   Alkaline Phos 44 - 121 IU/L 62   49   AST 0 - 40 IU/L 15   17   ALT 0 - 32 IU/L 11   17       Latest Ref Rng & Units 09/22/2022   12:19 PM 03/10/2022    3:15 AM 02/24/2022    1:49 PM  CBC  WBC 3.4 - 10.8 x10E3/uL 6.7  11.6  6.9   Hemoglobin 11.1 - 15.9 g/dL 12.2  9.6  12.0   Hematocrit 34.0 - 46.6 % 37.8  29.3  38.2   Platelets 150 - 450 x10E3/uL 234  180  243    Lipid Panel     Component Value Date/Time   CHOL 130 11/12/2021 1501   TRIG 82 11/12/2021 1501   HDL 57 11/12/2021 1501   CHOLHDL 2.3 11/12/2021 1501   CHOLHDL 4.1 04/05/2013 0858   VLDL 15 04/05/2013 0858   LDLCALC 57 11/12/2021 1501   HEMOGLOBIN A1C Lab Results  Component Value Date   HGBA1C 5.3 02/09/2022   MPG 117 (H) 07/11/2014   TSH No results for input(s): "TSH" in the last 8760 hours.  BNP    Component Value  Date/Time   BNP 17.4 01/14/2021 1616    ProBNP    Component Value Date/Time   PROBNP 34.0 07/30/2016 1623     External labs  03/14/2018: Cholesterol 175, triglycerides 78, HDL 47, LDL 112. Sedimentation rate 16.  12/27/2017: Hemoglobin A1c  6.2%. Creatinine 0.92, EGFR 76/66, potassium 4.0, BMP normal. Vitamin B12 308.  05/02/2017: Anti--DNA less than 1. RNP antibodies 1.1 elevated. Sjorgens negative. ANA direct positive. TSH 2.6. Vitamin D 39.  03/07/2017: Cholesterol 183, triglycerides 66, HDL 56, LDL 114.  Allergies  No Known Allergies   Medications Prior to Visit:   Outpatient Medications Prior to Visit  Medication Sig Dispense Refill   albuterol (VENTOLIN HFA) 108 (90 Base) MCG/ACT inhaler INHALE TWO PUFFS BY MOUTH EVERY 6 HOURS AS NEEDED FOR WHEEZING OR SHORTNESS OF BREATH 8.5 g 1   ASPIRIN 81 PO Take 1 tablet by mouth daily.     Brinzolamide-Brimonidine (SIMBRINZA) 1-0.2 % SUSP Place 1 drop into both eyes daily.     CALCIUM PO Take 1 tablet by mouth daily.     Cholecalciferol (VITAMIN D3) 5000 UNITS CAPS Take 5,000 Units by mouth daily.     Magnesium 400 MG CAPS Take 400 mg by mouth daily.     metoprolol succinate (TOPROL-XL) 25 MG 24 hr tablet TAKE ONE TABLET BY MOUTH DAILY 90 tablet 1   Multiple Vitamin (MULTIVITAMIN) capsule Take 1 capsule by mouth daily.     telmisartan (MICARDIS) 80 MG tablet Take 1 tablet (80 mg total) by mouth daily. 90 tablet 1   atorvastatin (LIPITOR) 10 MG tablet TAKE ONE TABLET BY MOUTH DAILY 90 tablet 1   No facility-administered medications prior to visit.   Final Medications at End of Visit    Current Meds  Medication Sig   albuterol (VENTOLIN HFA) 108 (90 Base) MCG/ACT inhaler INHALE TWO PUFFS BY MOUTH EVERY 6 HOURS AS NEEDED FOR WHEEZING OR SHORTNESS OF BREATH   ASPIRIN 81 PO Take 1 tablet by mouth daily.   Brinzolamide-Brimonidine (SIMBRINZA) 1-0.2 % SUSP Place 1 drop into both eyes daily.   CALCIUM PO Take 1 tablet by mouth daily.   Cholecalciferol (VITAMIN D3) 5000 UNITS CAPS Take 5,000 Units by mouth daily.   isosorbide mononitrate (IMDUR) 30 MG 24 hr tablet Take 1 tablet (30 mg total) by mouth daily.   Magnesium 400 MG CAPS Take 400 mg by mouth daily.   metoprolol succinate  (TOPROL-XL) 25 MG 24 hr tablet TAKE ONE TABLET BY MOUTH DAILY   Multiple Vitamin (MULTIVITAMIN) capsule Take 1 capsule by mouth daily.   rosuvastatin (CRESTOR) 20 MG tablet Take 1 tablet (20 mg total) by mouth daily.   telmisartan (MICARDIS) 80 MG tablet Take 1 tablet (80 mg total) by mouth daily.   [DISCONTINUED] atorvastatin (LIPITOR) 10 MG tablet TAKE ONE TABLET BY MOUTH DAILY   Radiology:   Chest x-ray PA and lateral view 12/08/2017: No acute pneumonia nor CHF. Bilateral hilar lymphadenopathy and mild chronic interstitial prominence consistent with known sarcoidosis.  Cardiac Studies:   Coronary angiogram 09/12/2014: Normal LV systolic function at 81%, 40% ostial stenosis of the LAD, otherwise normal coronary arteries.  Exercise sestamibi stress test 03/20/2018: 1. The patient performed treadmill exercise using a Bruce protocol, completing 4:49 minutes. The patient completed an estimated workload of 6.81 METS, reaching 105% of the maximum predicted heart rate. No stress symptoms reported. Peak BP 220/98 mmHg. No ischemic changes seen on stress electrocardiogram. 2. The overall quality of the study is good. There  is no evidence of abnormal lung activity. Stress and rest SPECT images demonstrate homogeneous tracer distribution throughout the myocardium. Gated SPECT imaging reveals normal myocardial thickening and wall motion. The left ventricular ejection fraction was normal (56%). 3. Low risk study.  Echocardiogram 12/07/2019:  Left ventricle cavity is normal in size and wall thickness. Normal LV systolic function with EF 56%. Normal global wall motion. Unable to evaluate diastolic function due to severity of mitral annular calcification.  Calculated EF 56%.  Left atrial cavity is severely dilated.  Severe calcification of the mitral valve annulus. Mild (Grade I) mitral regurgitation.  Mild tricuspid regurgitation. Estimated pulmonary artery systolic pressure is 30 mmHg.  No significant  change compared to previous study on 04/04/2018.   Coronary CTA 01/25/2020:  1. Coronary calcium score of 201. This was 89th percentile for age and sex matched control. 2. Normal coronary origin with right dominance. 3.  Mild (25-49%) calcified plaque in a small D1. 4.  Moderate (50-69%) mixed plaque in the mid LCX and OM2. 5.  Will send study for FFRct. 6.  Moderate mitral annular calcification.  FFR findings consistent with nonobstructive coronary artery disease.  Extracardiac findings: Extensive mediastinal and bilateral hilar lymphadenopathy, compatible with reported clinical history of sarcoidosis. A few scattered tiny 2-4 mm pulmonary nodules noted in the lungs bilaterally, nonspecific, but statistically likely benign in this patient with history of sarcoidosis.   PCV ECHOCARDIOGRAM COMPLETE 00/93/8182 Normal LV systolic function with visual EF 55-60%. Left ventricle cavity is normal in size. The left ventricle is normal thickness, septal bulge. Normal global wall motion. Unable to evaluate diastolic function due to severity of mitral annular  calcification. Elevated LAP. Severe calcification of the mitral valve annulus. Native mitral valve with trace regurgitation. No evidence of mitral valve stenosis. Compared to prior study dated 12/07/2019 no significant change.  EKG   09/07/2022: Sinus Rhythm Left axis -anterior fascicular block. LVH criteria met, iLBBB. No significant change compared to prior  02/03/2022: Sinus rhythm with borderline first-degree AV block at a rate of 84 bpm.  Left axis, left anterior fascicular block.  LVH with secondary ST-T wave abnormalities, cannot exclude anterolateral ischemia.  Unchanged compared to EKG 12/26/2019, 01/13/2021.  EKG 01/13/2021: Sinus rhythm rate of 76 bpm.  Left axis, left anterior fascicular block.  Poor progression, cannot exclude anteroseptal infarct old.  LVH with secondary ST-T abnormalities, cannot exclude anterolateral ischemia.  EKG  12/26/2019: Normal sinus rhythm with rate of 67 bpm, left axis deviation, left anterior fascicular block.  IVCD, incomplete left bundle branch block.  T wave normality, cannot exclude anterolateral ischemia.   Compared to EKG 03/13/2018, anterolateral T inversion new.  Assessment     ICD-10-CM   1. Essential hypertension  I10     2. Hypercholesteremia  E78.00     3. Dyspnea on exertion  R06.09        Meds ordered this encounter  Medications   rosuvastatin (CRESTOR) 20 MG tablet    Sig: Take 1 tablet (20 mg total) by mouth daily.    Dispense:  90 tablet    Refill:  3   isosorbide mononitrate (IMDUR) 30 MG 24 hr tablet    Sig: Take 1 tablet (30 mg total) by mouth daily.    Dispense:  90 tablet    Refill:  3    Medications Discontinued During This Encounter  Medication Reason   atorvastatin (LIPITOR) 10 MG tablet       Recommendations:   Madelon Lips  is a 71 y.o. 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 Christinia Gully, morbid obesity, severe obstructive sleep apnea on CPAP since August 2018 and follows Dr. Roddie Mc, hypertension, mild hyperlipidemia,  prediabetes, positive RNP antibody and also elevated ANA, had non-ischemic stress test in May 2019. Due to worsening dyspnea on exertion and abnormal EKG with marked T wave abnormality in anterolateral leads, underwent coronary CTA & repeat echocardiogram in 2021 revealing moderate non-obstructive CAD normal LVEF.   Dyspnea on exertion Imdur 72m sent to pharmacy Stress test grossly positive for reversible defect Coronary CT placed her at [place] for risk in her age group Will proceed with cardiac catheterization given abnormal stress and symptoms  Schedule for cardiac catheterization, and possible angioplasty. We discussed regarding risks, benefits, alternatives to this including stress testing, CTA and continued medical therapy. Patient wants to proceed. Understands <1-2% risk of death, stroke,  MI, urgent CABG, bleeding, infection, renal failure but not limited to these. Patient instructed not to do heavy lifting, heavy exertional activity, swimming until evaluation is complete.  Patient instructed to call if symptoms worse or to go to the ED for further evaluation.   Mixed hyperlipidemia Stop lipitor and start crestor   Essential hypertension Continue current cardiac medications. Encourage low-sodium diet, less than 2000 mg daily.    Follow-up in 1-2 months, sooner if needed.   SFloydene Flock DO, FDigestive Health Center Of Plano11/28/2023, 11:39 AM Office: [phone]

## 2022-10-13 LAB — CBC/DIFF AMBIGUOUS DEFAULT
Basophils Absolute: 0 10*3/uL (ref 0.0–0.2)
Basos: 1 %
EOS (ABSOLUTE): 0.2 10*3/uL (ref 0.0–0.4)
Eos: 3 %
Hematocrit: 34.9 % (ref 34.0–46.6)
Hemoglobin: 11.3 g/dL (ref 11.1–15.9)
Immature Grans (Abs): 0 10*3/uL (ref 0.0–0.1)
Immature Granulocytes: 0 %
Lymphocytes Absolute: 1.4 10*3/uL (ref 0.7–3.1)
Lymphs: 23 %
MCH: 26.3 pg — ABNORMAL LOW (ref 26.6–33.0)
MCHC: 32.4 g/dL (ref 31.5–35.7)
MCV: 81 fL (ref 79–97)
Monocytes Absolute: 0.7 10*3/uL (ref 0.1–0.9)
Monocytes: 11 %
Neutrophils Absolute: 3.8 10*3/uL (ref 1.4–7.0)
Neutrophils: 62 %
Platelets: 215 10*3/uL (ref 150–450)
RBC: 4.3 x10E6/uL (ref 3.77–5.28)
RDW: 13.3 % (ref 11.7–15.4)
WBC: 6.1 10*3/uL (ref 3.4–10.8)

## 2022-10-13 LAB — CMP14+EGFR
ALT: 12 IU/L (ref 0–32)
AST: 15 IU/L (ref 0–40)
Albumin/Globulin Ratio: 1.6 (ref 1.2–2.2)
Albumin: 4.1 g/dL (ref 3.9–4.9)
Alkaline Phosphatase: 60 IU/L (ref 44–121)
BUN/Creatinine Ratio: 17 (ref 12–28)
BUN: 14 mg/dL (ref 8–27)
Bilirubin Total: 0.2 mg/dL (ref 0.0–1.2)
CO2: 26 mmol/L (ref 20–29)
Calcium: 9.4 mg/dL (ref 8.7–10.3)
Chloride: 108 mmol/L — ABNORMAL HIGH (ref 96–106)
Creatinine, Ser: 0.84 mg/dL (ref 0.57–1.00)
Globulin, Total: 2.5 g/dL (ref 1.5–4.5)
Glucose: 87 mg/dL (ref 70–99)
Potassium: 4.3 mmol/L (ref 3.5–5.2)
Sodium: 146 mmol/L — ABNORMAL HIGH (ref 134–144)
Total Protein: 6.6 g/dL (ref 6.0–8.5)
eGFR: 75 mL/min/{1.73_m2} (ref >59)

## 2022-10-13 NOTE — Progress Notes (Signed)
Pre-cath

## 2022-10-14 ENCOUNTER — Ambulatory Visit (INDEPENDENT_AMBULATORY_CARE_PROVIDER_SITE_OTHER): Payer: BC Managed Care – PPO | Admitting: Emergency Medicine

## 2022-10-14 ENCOUNTER — Encounter: Payer: Self-pay | Admitting: Emergency Medicine

## 2022-10-14 VITALS — BP 144/76 | HR 84 | Temp 97.6°F | Ht 66.0 in | Wt 217.0 lb

## 2022-10-14 DIAGNOSIS — R0609 Other forms of dyspnea: Secondary | ICD-10-CM | POA: Diagnosis not present

## 2022-10-14 DIAGNOSIS — D869 Sarcoidosis, unspecified: Secondary | ICD-10-CM

## 2022-10-14 LAB — PULMONARY FUNCTION TEST
DL/VA % pred: 96 %
DL/VA: 3.92 ml/min/mmHg/L
DLCO cor % pred: 93 %
DLCO cor: 20.27 ml/min/mmHg
DLCO unc % pred: 87 %
DLCO unc: 18.83 ml/min/mmHg
FEF 25-75 Post: 2.28 L/sec
FEF 25-75 Pre: 2.09 L/sec
FEF2575-%Change-Post: 8 %
FEF2575-%Pred-Post: 107 %
FEF2575-%Pred-Pre: 98 %
FEV1-%Change-Post: 2 %
FEV1-%Pred-Post: 93 %
FEV1-%Pred-Pre: 90 %
FEV1-Post: 2.42 L
FEV1-Pre: 2.35 L
FEV1FVC-%Change-Post: 3 %
FEV1FVC-%Pred-Pre: 101 %
FEV6-%Change-Post: 0 %
FEV6-%Pred-Post: 92 %
FEV6-%Pred-Pre: 92 %
FEV6-Post: 3.01 L
FEV6-Pre: 3 L
FEV6FVC-%Pred-Post: 104 %
FEV6FVC-%Pred-Pre: 104 %
FVC-%Change-Post: 0 %
FVC-%Pred-Post: 88 %
FVC-%Pred-Pre: 89 %
FVC-Post: 3.01 L
FVC-Pre: 3.03 L
Post FEV1/FVC ratio: 80 %
Post FEV6/FVC ratio: 100 %
Pre FEV1/FVC ratio: 78 %
Pre FEV6/FVC Ratio: 100 %
RV % pred: 114 %
RV: 2.66 L
TLC % pred: 111 %
TLC: 6.13 L

## 2022-10-14 NOTE — Progress Notes (Signed)
PFT done today. 

## 2022-10-14 NOTE — Assessment & Plan Note (Signed)
Her chest x-ray at last visit was stable without any infiltrates or evidence of active sarcoid.  Her pulmonary function testing today is stable as well.  She saw Dr. Einar Gip and had a stress echocardiogram since I last saw her.  This had some suspicious findings that she is scheduled for cardiac catheterization on 12/5.  I will ask her to keep her albuterol available to use if needed.  Suspect that her progressive dyspnea is either cardiac in nature or deconditioning.

## 2022-10-14 NOTE — Assessment & Plan Note (Signed)
No evidence of progression based on her chest x-ray from last month or her pulmonary function testing from today.  Reassured her about this.

## 2022-10-14 NOTE — Patient Instructions (Addendum)
We reviewed your chest x-ray and your pulmonary function testing today. Keep albuterol available to use 2 puffs to be needed for shortness of breath, chest tightness, wheezing. Agree with catheterization and cardiac evaluation as planned by Dr. Einar Gip. Flu shot up-to-date. You can get the COVID-19 vaccine 90 days after your recent positive COVID test. Follow with Dr Lamonte Sakai in 6 months or sooner if you have any problems

## 2022-10-14 NOTE — Progress Notes (Signed)
Subjective:    Patient ID: Kristine [name], female    DOB: 06-04-1951, 71 y.o.   MRN: [id number]  HPI  ROV 08/26/2022 --71 year old woman with a history of sarcoidosis with associated iritis, some suspected mild associated obstructive lung disease.  She has some calcified mediastinal adenopathy on chest imaging but no overt interstitial lung disease. She has lost about 40 lbs since I last saw her. She still gets fatigued w exertion. She is having more exertional SOB than last time I saw her. Her last TTE was 01/2021, her last CT and PFT were 2 yrs ago.  No rash, minimal cough.  No visual changes.  ROV 10/14/22 --Kristine [name] is a 26 with a history of sarcoidosis and associated mild obstructive lung disease.  She has a history of sarcoid iritis.  She has mediastinal adenopathy (calcified) on chest imaging but no overt interstitial lung disease.  I saw her 6 weeks ago when she was experiencing more exertional shortness of breath, question some component of deconditioning because she had to decrease her exercise routine when she had a knee replacement.  We repeated pulmonary function testing as below.  Chest x-ray 08/26/2022 did not show any new infiltrates or signs of inflammation from sarcoid.  She did have persistent adenopathy. She underwent a stress echo on 11/20 that was concerning for a fixed perfusion deficit, possibly some reversibility. She is scheduled for cardiac cath w Dr Einar Gip on 12/5.  She has had the flu shot. She had covid in mid November - URI sx. She continues to have some cough and congestion.   Pulmonary function testing performed today and reviewed by me, shows normal airflows without a bronchodilator response.  FEV1 2.35 L (90% predicted).  Normal lung volumes with TLC 111%.  Diffusion capacity normal.  FEV1 slightly decreased compared with 2 years ago (2.47 L).  Total lung capacity actually improved.   Review of Systems As per HPI     Objective:   Physical Exam  Vitals:    10/14/22 1115  BP: (!) 144/76  Pulse: 84  Temp: 97.6 F (36.4 C)  TempSrc: Oral  SpO2: 97%  Weight: 217 lb (98.4 kg)  Height: 5' 6" (1.676 m)   Gen: Pleasant, overwt woman, in no distress,  normal affect  ENT: No lesions,  mouth clear,  oropharynx clear, no postnasal drip  Neck: No JVD, no stridor  Lungs: No use of accessory muscles, no crackles or wheezing on normal respiration, no wheeze on forced expiration  Cardiovascular: RRR, heart sounds normal, no murmur or gallops, no peripheral edema  Musculoskeletal: No deformities, no cyanosis or clubbing  Neuro: alert, awake, non focal  Skin: Warm, no lesions or rash      Assessment & Plan:  DOE (dyspnea on exertion) Her chest x-ray at last visit was stable without any infiltrates or evidence of active sarcoid.  Her pulmonary function testing today is stable as well.  She saw Dr. Einar Gip and had a stress echocardiogram since I last saw her.  This had some suspicious findings that she is scheduled for cardiac catheterization on 12/5.  I will ask her to keep her albuterol available to use if needed.  Suspect that her progressive dyspnea is either cardiac in nature or deconditioning.  Sarcoidosis ([name]) No evidence of progression based on her chest x-ray from last month or her pulmonary function testing from today.  Reassured her about this.  Baltazar Apo, MD, PhD 10/14/2022, 11:41 AM [facility] [phone] or  if no answer [phone]

## 2022-10-19 ENCOUNTER — Other Ambulatory Visit: Payer: Self-pay

## 2022-10-19 ENCOUNTER — Ambulatory Visit (HOSPITAL_COMMUNITY)
Admission: RE | Admit: 2022-10-19 | Discharge: 2022-10-19 | Disposition: A | Discharge status: Home / Self Care | Payer: BC Managed Care – PPO | Attending: Cardiology | Admitting: Cardiology

## 2022-10-19 ENCOUNTER — Encounter (HOSPITAL_COMMUNITY)
Admission: RE | Disposition: A | Discharge status: Home / Self Care | Payer: Self-pay | Source: Home / Self Care | Attending: Cardiology

## 2022-10-19 DIAGNOSIS — I2510 Atherosclerotic heart disease of native coronary artery without angina pectoris: Secondary | ICD-10-CM

## 2022-10-19 DIAGNOSIS — R7303 Prediabetes: Secondary | ICD-10-CM | POA: Diagnosis not present

## 2022-10-19 DIAGNOSIS — R9439 Abnormal result of other cardiovascular function study: Secondary | ICD-10-CM | POA: Diagnosis present

## 2022-10-19 DIAGNOSIS — D869 Sarcoidosis, unspecified: Secondary | ICD-10-CM | POA: Diagnosis not present

## 2022-10-19 DIAGNOSIS — Z79899 Other long term (current) drug therapy: Secondary | ICD-10-CM | POA: Insufficient documentation

## 2022-10-19 DIAGNOSIS — I10 Essential (primary) hypertension: Secondary | ICD-10-CM | POA: Insufficient documentation

## 2022-10-19 DIAGNOSIS — I25118 Atherosclerotic heart disease of native coronary artery with other forms of angina pectoris: Secondary | ICD-10-CM | POA: Diagnosis not present

## 2022-10-19 DIAGNOSIS — E785 Hyperlipidemia, unspecified: Secondary | ICD-10-CM | POA: Insufficient documentation

## 2022-10-19 DIAGNOSIS — R0609 Other forms of dyspnea: Secondary | ICD-10-CM | POA: Diagnosis not present

## 2022-10-19 DIAGNOSIS — G4733 Obstructive sleep apnea (adult) (pediatric): Secondary | ICD-10-CM | POA: Insufficient documentation

## 2022-10-19 DIAGNOSIS — E7800 Pure hypercholesterolemia, unspecified: Secondary | ICD-10-CM | POA: Insufficient documentation

## 2022-10-19 DIAGNOSIS — Z6836 Body mass index (BMI) 36.0-36.9, adult: Secondary | ICD-10-CM | POA: Diagnosis not present

## 2022-10-19 HISTORY — PX: INTRAVASCULAR PRESSURE WIRE/FFR STUDY: CATH118243

## 2022-10-19 HISTORY — PX: RIGHT/LEFT HEART CATH AND CORONARY ANGIOGRAPHY: CATH118266

## 2022-10-19 SURGERY — INTRAVASCULAR PRESSURE WIRE/FFR STUDY
Anesthesia: LOCAL

## 2022-10-19 MED ORDER — HEPARIN SODIUM (PORCINE) 1000 UNIT/ML IJ SOLN
INTRAMUSCULAR | Status: DC | PRN
  Administered 2022-10-19 (×2): 5000 [IU] via INTRAVENOUS

## 2022-10-19 MED ORDER — HEPARIN SODIUM (PORCINE) 1000 UNIT/ML IJ SOLN
INTRAMUSCULAR | Status: AC
  Filled 2022-10-19: qty 10

## 2022-10-19 MED ORDER — MIDAZOLAM HCL 2 MG/2ML IJ SOLN
INTRAMUSCULAR | Status: DC | PRN
  Administered 2022-10-19: 1 mg via INTRAVENOUS

## 2022-10-19 MED ORDER — SODIUM CHLORIDE 0.9 % IV SOLN: INTRAVENOUS | Status: AC

## 2022-10-19 MED ORDER — VERAPAMIL HCL 2.5 MG/ML IV SOLN
INTRAVENOUS | Status: AC
  Filled 2022-10-19: qty 2

## 2022-10-19 MED ORDER — NITROGLYCERIN 1 MG/10 ML FOR IR/CATH LAB
INTRA_ARTERIAL | Status: AC
  Filled 2022-10-19: qty 10

## 2022-10-19 MED ORDER — SODIUM CHLORIDE 0.9% FLUSH: 3.0000 mL | Freq: Two times a day (BID) | INTRAVENOUS | Status: DC

## 2022-10-19 MED ORDER — VERAPAMIL HCL 2.5 MG/ML IV SOLN
INTRAVENOUS | Status: DC | PRN
  Administered 2022-10-19: 10 mL via INTRA_ARTERIAL

## 2022-10-19 MED ORDER — ACETAMINOPHEN 325 MG PO TABS
650.0000 mg | ORAL_TABLET | ORAL | Status: DC | PRN
  Administered 2022-10-19: 650 mg via ORAL
  Filled 2022-10-19: qty 2

## 2022-10-19 MED ORDER — HEPARIN (PORCINE) IN NACL 1000-0.9 UT/500ML-% IV SOLN
INTRAVENOUS | Status: AC
  Filled 2022-10-19: qty 1000

## 2022-10-19 MED ORDER — HYDRALAZINE HCL 20 MG/ML IJ SOLN: 10.0000 mg | INTRAMUSCULAR | Status: DC | PRN

## 2022-10-19 MED ORDER — SODIUM CHLORIDE 0.9 % IV SOLN: 250.0000 mL | INTRAVENOUS | Status: DC | PRN

## 2022-10-19 MED ORDER — SODIUM CHLORIDE 0.9% FLUSH: 3.0000 mL | INTRAVENOUS | Status: DC | PRN

## 2022-10-19 MED ORDER — NITROGLYCERIN IN D5W 200-5 MCG/ML-% IV SOLN
INTRAVENOUS | Status: AC
  Filled 2022-10-19: qty 250

## 2022-10-19 MED ORDER — ASPIRIN 81 MG PO CHEW: 81.0000 mg | CHEWABLE_TABLET | ORAL | Status: DC

## 2022-10-19 MED ORDER — FENTANYL CITRATE (PF) 100 MCG/2ML IJ SOLN
INTRAMUSCULAR | Status: DC | PRN
  Administered 2022-10-19: 50 ug via INTRAVENOUS

## 2022-10-19 MED ORDER — LIDOCAINE HCL (PF) 1 % IJ SOLN
INTRAMUSCULAR | Status: DC | PRN
  Administered 2022-10-19 (×2): 2 mL

## 2022-10-19 MED ORDER — MIDAZOLAM HCL 2 MG/2ML IJ SOLN
INTRAMUSCULAR | Status: AC
  Filled 2022-10-19: qty 2

## 2022-10-19 MED ORDER — HEPARIN (PORCINE) IN NACL 1000-0.9 UT/500ML-% IV SOLN
INTRAVENOUS | Status: DC | PRN
  Administered 2022-10-19 (×2): 500 mL

## 2022-10-19 MED ORDER — SODIUM CHLORIDE 0.9 % WEIGHT BASED INFUSION
3.0000 mL/kg/h | INTRAVENOUS | Status: AC
  Administered 2022-10-19: 3 mL/kg/h via INTRAVENOUS

## 2022-10-19 MED ORDER — SODIUM CHLORIDE 0.9 % WEIGHT BASED INFUSION: 1.0000 mL/kg/h | INTRAVENOUS | Status: DC

## 2022-10-19 MED ORDER — ONDANSETRON HCL 4 MG/2ML IJ SOLN: 4.0000 mg | Freq: Four times a day (QID) | INTRAMUSCULAR | Status: DC | PRN

## 2022-10-19 MED ORDER — LIDOCAINE HCL (PF) 1 % IJ SOLN
INTRAMUSCULAR | Status: AC
  Filled 2022-10-19: qty 30

## 2022-10-19 MED ORDER — FENTANYL CITRATE (PF) 100 MCG/2ML IJ SOLN
INTRAMUSCULAR | Status: AC
  Filled 2022-10-19: qty 2

## 2022-10-19 MED ORDER — LABETALOL HCL 5 MG/ML IV SOLN: 10.0000 mg | INTRAVENOUS | Status: DC | PRN

## 2022-10-19 MED ORDER — IOHEXOL 350 MG/ML SOLN
INTRAVENOUS | Status: DC | PRN
  Administered 2022-10-19: 80 mL

## 2022-10-19 SURGICAL SUPPLY — 17 items
CATH LAUNCHER 6FR EBU3.5 (CATHETERS) IMPLANT
CATH OPTITORQUE TIG 4.0 5F (CATHETERS) IMPLANT
CATH SWAN GANZ 7F STRAIGHT (CATHETERS) IMPLANT
DEVICE RAD COMP TR BAND LRG (VASCULAR PRODUCTS) IMPLANT
GLIDESHEATH SLEND A-KIT 6F 22G (SHEATH) IMPLANT
GLIDESHEATH SLENDER 7FR .021G (SHEATH) IMPLANT
GUIDEWIRE INQWIRE 1.5J.035X260 (WIRE) IMPLANT
GUIDEWIRE PRESSURE X 175 (WIRE) IMPLANT
INQWIRE 1.5J .035X260CM (WIRE) ×1
KIT HEART LEFT (KITS) ×1 IMPLANT
KIT HEMO VALVE WATCHDOG (MISCELLANEOUS) IMPLANT
PACK CARDIAC CATHETERIZATION (CUSTOM PROCEDURE TRAY) ×1 IMPLANT
SHEATH PINNACLE 7F 10CM (SHEATH) IMPLANT
TRANSDUCER W/STOPCOCK (MISCELLANEOUS) ×1 IMPLANT
TUBING CIL FLEX 10 FLL-RA (TUBING) ×1 IMPLANT
WIRE EMERALD 3MM-J .025X260CM (WIRE) IMPLANT
WIRE HI TORQ WHISPER MS 190CM (WIRE) IMPLANT

## 2022-10-19 NOTE — Interval H&P Note (Signed)
History and Physical Interval Note:  10/19/2022 1:36 PM  Kristine [name]  has presented today for surgery, with the diagnosis of abnormal stress test; CAD.  The various methods of treatment have been discussed with the patient and family. After consideration of risks, benefits and other options for treatment, the patient has consented to  Procedure(s): RIGHT/LEFT HEART CATH AND CORONARY ANGIOGRAPHY (N/A) as a surgical intervention.  The patient's history has been reviewed, patient examined, no change in status, stable for surgery.  I have reviewed the patient's chart and labs.  Questions were answered to the patient's satisfaction.    2016/2017 Appropriate Use Criteria for Coronary Revascularization Symptom Status: Ischemic Symptoms  Non-invasive Testing: Intermediate Risk  If no or indeterminate stress test, FFR/iFR results in all diseased vessels: N/A  Diabetes Mellitus: No  S/P CABG: No  Antianginal therapy (number of long-acting drugs): >=2  Patient undergoing renal transplant: No  Patient undergoing percutaneous valve procedure: No  1 Vessel Disease PCI CABG  No proximal LAD involvement, No proximal left dominant LCX involvement A (8); Indication 2 M (6); Indication 2  Proximal left dominant LCX involvement A (8); Indication 5 A (8); Indication 5  Proximal LAD involvement A (8); Indication 5 A (8); Indication 5  2 Vessel Disease  No proximal LAD involvement A (8); Indication 8 A (7); Indication 8  Proximal LAD involvement A (8); Indication 11 A (8); Indication 11  3 Vessel Disease  Low disease complexity (e.g., focal stenoses, SYNTAX <=22) A (8); Indication 17 A (8); Indication 17  Intermediate or high disease complexity (e.g., SYNTAX >=23) M (6); Indication 21 A (9); Indication 21  Left Main Disease  Isolated LMCA disease: ostial or midshaft A (7); Indication 24 A (9); Indication 24  Isolated LMCA disease: bifurcation involvement M (6); Indication 25 A (9); Indication 25  LMCA  ostial or midshaft, concurrent low disease burden multivessel disease (e.g., 1-2 additional focal stenoses, SYNTAX <=22) A (7); Indication 26 A (9); Indication 26  LMCA ostial or midshaft, concurrent intermediate or high disease burden multivessel disease (e.g., 1-2 additional bifurcation stenoses, long stenoses, SYNTAX >=23) M (4); Indication 27 A (9); Indication 27  LMCA bifurcation involvement, concurrent low disease burden multivessel disease (e.g., 1-2 additional focal stenoses, SYNTAX <=22) M (6); Indication 28 A (9); Indication 28  LMCA bifurcation involvement, concurrent intermediate or high disease burden multivessel disease (e.g., 1-2 additional bifurcation stenoses, long stenoses, SYNTAX >=23) R (3); Indication 29 A (9); Indication [place]

## 2022-10-19 NOTE — Discharge Instructions (Signed)

## 2022-10-19 NOTE — H&P (Signed)
OV copied for documentation   Primary Physician/Referring:  Glendale Chard, MD  Patient ID: Kristine [name], female    DOB: 1951-06-06, 71 y.o.   MRN: [id number]  Chief Complaint  Patient presents with   Follow-up    Test results   Hypertension   Coronary Artery Disease   HPI:    Kristine [name]  is a 71 y.o. 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 Christinia Gully, morbid obesity, severe obstructive sleep apnea on CPAP since August 2018 and follows Dr. Roddie Mc, hypertension, mild hyperlipidemia,  prediabetes, positive RNP antibody and also elevated ANA, had non-ischemic stress test in May 2019. Due to worsening dyspnea on exertion and abnormal EKG with marked T wave abnormality in anterolateral leads, underwent coronary CTA & repeat echocardiogram in 2021 revealing moderate non-obstructive CAD normal LVEF.   Patient is here for a follow-up visit. She has noticed she is having chest/arm pain and shortness of breath with activity. She is supposed to have knee surgery at the end of December but her stress test was grossly positive for ischemia and she will need heart catheterization prior to undergoing surgery especially given her exertional symptoms.  Denies leg swelling, orthopnea, PND, syncope, near syncope, palpitations.  Past Medical History:  Diagnosis Date   Allergy    Arthritis    Asthma    Back pain    Complication of anesthesia    slow to wake up   Dyspnea    Glaucoma    ROD   Heart murmur    Hypertension    Joint pain    Knee pain    Obesity    Sarcoidosis    SOB (shortness of breath)    Past Surgical History:  Procedure Laterality Date   ABDOMINAL HYSTERECTOMY     BUNIONECTOMY     left   CHOLECYSTECTOMY     KNEE SURGERY     right   LEFT HEART CATHETERIZATION WITH CORONARY ANGIOGRAM N/A 09/12/2014   Procedure: LEFT HEART CATHETERIZATION WITH CORONARY ANGIOGRAM;  Surgeon: Troy Sine, MD;  Location: [facility] CATH LAB;  Service:  Cardiovascular;  Laterality: N/A;   TOTAL KNEE ARTHROPLASTY Right 03/09/2022   Procedure: TOTAL KNEE ARTHROPLASTY;  Surgeon: Paralee Cancel, MD;  Location: [facility] ORS;  Service: Orthopedics;  Laterality: Right;   Family History  Problem Relation Age of Onset   Breast cancer Mother    Lung cancer Mother    Lung cancer Father        smoked   Heart disease Maternal Grandmother    Hypertension Brother    Liver cancer Brother    Pneumonia Brother    Social History   Tobacco Use   Smoking status: Never   Smokeless tobacco: Never  Substance Use Topics   Alcohol use: Yes    Comment: occ  Marital Status: Married  ROS  Review of Systems  Cardiovascular:  Positive for chest pain and dyspnea on exertion. Negative for claudication, leg swelling, near-syncope, orthopnea, palpitations, paroxysmal nocturnal dyspnea and syncope.  Gastrointestinal:  Negative for melena.  Neurological:  Negative for dizziness.    Objective  Blood pressure (!) 140/85, pulse 78, height _0  (1.651 m), weight 217 lb (98.4 kg), SpO2 100 %.     10/12/2022   11:30 AM 09/22/2022   11:30 AM 09/07/2022   10:36 AM  Vitals with BMI  Height _1  5' 5.4" 5' 5.5"  Weight 217 lbs 213 lbs 219 lbs 3 oz  BMI 36.11  32.00 37.94  Systolic 446 190 122  Diastolic 85 78 87  Pulse 78 74 77     Physical Exam Vitals reviewed.  Neck:     Thyroid: No thyromegaly.  Cardiovascular:     Rate and Rhythm: Normal rate and regular rhythm.     Pulses: Intact distal pulses.     Heart sounds: Normal heart sounds, S1 normal and S2 normal. No murmur heard.    No gallop.     Comments: No leg edema, no JVD. Pulmonary:     Effort: Pulmonary effort is normal. No respiratory distress.     Breath sounds: Normal breath sounds. No wheezing, rhonchi or rales.  Abdominal:     Comments: Obese and pannus is present.  Musculoskeletal:     Right lower leg: No edema.     Left lower leg: No edema.  Skin:    General: Skin is warm and dry.   Neurological:     Mental Status: She is alert.    Laboratory examination:   Recent Labs    02/24/22 1349 03/10/22 0315 09/22/22 1219  NA 140 141 144  K 4.0 3.8 4.3  CL 110 110 104  CO2 _0 GLUCOSE 88 124* 78  BUN _1 CREATININE 0.70 1.01* 0.84  CALCIUM 9.7 8.6* 9.5  GFRNONAA >60 >60  --    estimated creatinine clearance is 73.4 mL/min (by C-G formula based on SCr of 0.84 mg/dL).     Latest Ref Rng & Units 09/22/2022   12:19 PM 03/10/2022    3:15 AM 02/24/2022    1:49 PM  CMP  Glucose 70 - 99 mg/dL 78  124  88   BUN 8 - 27 mg/dL _2 Creatinine 0.57 - 1.00 mg/dL 0.84  1.01  0.70   Sodium 134 - 144 mmol/L 144  141  140   Potassium 3.5 - 5.2 mmol/L 4.3  3.8  4.0   Chloride 96 - 106 mmol/L 104  110  110   CO2 20 - 29 mmol/L _3 Calcium 8.7 - 10.3 mg/dL 9.5  8.6  9.7   Total Protein 6.0 - 8.5 g/dL 7.0   7.7   Total Bilirubin 0.0 - 1.2 mg/dL 0.3   0.3   Alkaline Phos 44 - 121 IU/L 62   49   AST 0 - 40 IU/L 15   17   ALT 0 - 32 IU/L 11   17       Latest Ref Rng & Units 09/22/2022   12:19 PM 03/10/2022    3:15 AM 02/24/2022    1:49 PM  CBC  WBC 3.4 - 10.8 x10E3/uL 6.7  11.6  6.9   Hemoglobin 11.1 - 15.9 g/dL 12.2  9.6  12.0   Hematocrit 34.0 - 46.6 % 37.8  29.3  38.2   Platelets 150 - 450 x10E3/uL 234  180  243    Lipid Panel     Component Value Date/Time   CHOL 130 11/12/2021 1501   TRIG 82 11/12/2021 1501   HDL 57 11/12/2021 1501   CHOLHDL 2.3 11/12/2021 1501   CHOLHDL 4.1 04/05/2013 0858   VLDL 15 04/05/2013 0858   LDLCALC 57 11/12/2021 1501   HEMOGLOBIN A1C Lab Results  Component Value Date   HGBA1C 5.3 02/09/2022   MPG 117 (H) 07/11/2014   TSH No results for input(s): "TSH" in the last 8760 hours.  BNP  Component Value Date/Time   BNP 17.4 01/14/2021 1616    ProBNP    Component Value Date/Time   PROBNP 34.0 07/30/2016 1623     External labs  03/14/2018: Cholesterol 175, triglycerides 78, HDL 47, LDL 112.  Sedimentation rate 16.  12/27/2017: Hemoglobin A1c 6.2%. Creatinine 0.92, EGFR 76/66, potassium 4.0, BMP normal. Vitamin B12 308.  05/02/2017: Anti--DNA less than 1. RNP antibodies 1.1 elevated. Sjorgens negative. ANA direct positive. TSH 2.6. Vitamin D 39.  03/07/2017: Cholesterol 183, triglycerides 66, HDL 56, LDL 114.  Allergies  No Known Allergies   Medications Prior to Visit:   Outpatient Medications Prior to Visit  Medication Sig Dispense Refill   albuterol (VENTOLIN HFA) 108 (90 Base) MCG/ACT inhaler INHALE TWO PUFFS BY MOUTH EVERY 6 HOURS AS NEEDED FOR WHEEZING OR SHORTNESS OF BREATH 8.5 g 1   ASPIRIN 81 PO Take 1 tablet by mouth daily.     Brinzolamide-Brimonidine (SIMBRINZA) 1-0.2 % SUSP Place 1 drop into both eyes daily.     CALCIUM PO Take 1 tablet by mouth daily.     Cholecalciferol (VITAMIN D3) 5000 UNITS CAPS Take 5,000 Units by mouth daily.     Magnesium 400 MG CAPS Take 400 mg by mouth daily.     metoprolol succinate (TOPROL-XL) 25 MG 24 hr tablet TAKE ONE TABLET BY MOUTH DAILY 90 tablet 1   Multiple Vitamin (MULTIVITAMIN) capsule Take 1 capsule by mouth daily.     telmisartan (MICARDIS) 80 MG tablet Take 1 tablet (80 mg total) by mouth daily. 90 tablet 1   atorvastatin (LIPITOR) 10 MG tablet TAKE ONE TABLET BY MOUTH DAILY 90 tablet 1   No facility-administered medications prior to visit.   Final Medications at End of Visit    Current Meds  Medication Sig   albuterol (VENTOLIN HFA) 108 (90 Base) MCG/ACT inhaler INHALE TWO PUFFS BY MOUTH EVERY 6 HOURS AS NEEDED FOR WHEEZING OR SHORTNESS OF BREATH   ASPIRIN 81 PO Take 1 tablet by mouth daily.   Brinzolamide-Brimonidine (SIMBRINZA) 1-0.2 % SUSP Place 1 drop into both eyes daily.   CALCIUM PO Take 1 tablet by mouth daily.   Cholecalciferol (VITAMIN D3) 5000 UNITS CAPS Take 5,000 Units by mouth daily.   isosorbide mononitrate (IMDUR) 30 MG 24 hr tablet Take 1 tablet (30 mg total) by mouth daily.   Magnesium 400 MG CAPS  Take 400 mg by mouth daily.   metoprolol succinate (TOPROL-XL) 25 MG 24 hr tablet TAKE ONE TABLET BY MOUTH DAILY   Multiple Vitamin (MULTIVITAMIN) capsule Take 1 capsule by mouth daily.   rosuvastatin (CRESTOR) 20 MG tablet Take 1 tablet (20 mg total) by mouth daily.   telmisartan (MICARDIS) 80 MG tablet Take 1 tablet (80 mg total) by mouth daily.   [DISCONTINUED] atorvastatin (LIPITOR) 10 MG tablet TAKE ONE TABLET BY MOUTH DAILY   Radiology:   Chest x-ray PA and lateral view 12/08/2017: No acute pneumonia nor CHF. Bilateral hilar lymphadenopathy and mild chronic interstitial prominence consistent with known sarcoidosis.  Cardiac Studies:   Coronary angiogram 09/12/2014: Normal LV systolic function at 28%, 40% ostial stenosis of the LAD, otherwise normal coronary arteries.  Exercise sestamibi stress test 03/20/2018: 1. The patient performed treadmill exercise using a Bruce protocol, completing 4:49 minutes. The patient completed an estimated workload of 6.81 METS, reaching 105% of the maximum predicted heart rate. No stress symptoms reported. Peak BP 220/98 mmHg. No ischemic changes seen on stress electrocardiogram. 2. The overall quality of the study is  good. There is no evidence of abnormal lung activity. Stress and rest SPECT images demonstrate homogeneous tracer distribution throughout the myocardium. Gated SPECT imaging reveals normal myocardial thickening and wall motion. The left ventricular ejection fraction was normal (56%). 3. Low risk study.  Echocardiogram 12/07/2019:  Left ventricle cavity is normal in size and wall thickness. Normal LV systolic function with EF 56%. Normal global wall motion. Unable to evaluate diastolic function due to severity of mitral annular calcification.  Calculated EF 56%.  Left atrial cavity is severely dilated.  Severe calcification of the mitral valve annulus. Mild (Grade I) mitral regurgitation.  Mild tricuspid regurgitation. Estimated pulmonary  artery systolic pressure is 30 mmHg.  No significant change compared to previous study on 04/04/2018.   Coronary CTA 01/25/2020:  1. Coronary calcium score of 201. This was 89th percentile for age and sex matched control. 2. Normal coronary origin with right dominance. 3.  Mild (25-49%) calcified plaque in a small D1. 4.  Moderate (50-69%) mixed plaque in the mid LCX and OM2. 5.  Will send study for FFRct. 6.  Moderate mitral annular calcification.  FFR findings consistent with nonobstructive coronary artery disease.  Extracardiac findings: Extensive mediastinal and bilateral hilar lymphadenopathy, compatible with reported clinical history of sarcoidosis. A few scattered tiny 2-4 mm pulmonary nodules noted in the lungs bilaterally, nonspecific, but statistically likely benign in this patient with history of sarcoidosis.   PCV ECHOCARDIOGRAM COMPLETE 36/14/4315 Normal LV systolic function with visual EF 55-60%. Left ventricle cavity is normal in size. The left ventricle is normal thickness, septal bulge. Normal global wall motion. Unable to evaluate diastolic function due to severity of mitral annular  calcification. Elevated LAP. Severe calcification of the mitral valve annulus. Native mitral valve with trace regurgitation. No evidence of mitral valve stenosis. Compared to prior study dated 12/07/2019 no significant change.  EKG   09/07/2022: Sinus Rhythm Left axis -anterior fascicular block. LVH criteria met, iLBBB. No significant change compared to prior  02/03/2022: Sinus rhythm with borderline first-degree AV block at a rate of 84 bpm.  Left axis, left anterior fascicular block.  LVH with secondary ST-T wave abnormalities, cannot exclude anterolateral ischemia.  Unchanged compared to EKG 12/26/2019, 01/13/2021.  EKG 01/13/2021: Sinus rhythm rate of 76 bpm.  Left axis, left anterior fascicular block.  Poor progression, cannot exclude anteroseptal infarct old.  LVH with secondary ST-T  abnormalities, cannot exclude anterolateral ischemia.  EKG 12/26/2019: Normal sinus rhythm with rate of 67 bpm, left axis deviation, left anterior fascicular block.  IVCD, incomplete left bundle branch block.  T wave normality, cannot exclude anterolateral ischemia.   Compared to EKG 03/13/2018, anterolateral T inversion new.  Assessment     ICD-10-CM   1. Essential hypertension  I10     2. Hypercholesteremia  E78.00     3. Dyspnea on exertion  R06.09        Meds ordered this encounter  Medications   rosuvastatin (CRESTOR) 20 MG tablet    Sig: Take 1 tablet (20 mg total) by mouth daily.    Dispense:  90 tablet    Refill:  3   isosorbide mononitrate (IMDUR) 30 MG 24 hr tablet    Sig: Take 1 tablet (30 mg total) by mouth daily.    Dispense:  90 tablet    Refill:  3    Medications Discontinued During This Encounter  Medication Reason   atorvastatin (LIPITOR) 10 MG tablet       Recommendations:   Kristine Andy  [name]  is a 71 y.o. 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 Christinia Gully, morbid obesity, severe obstructive sleep apnea on CPAP since August 2018 and follows Dr. Roddie Mc, hypertension, mild hyperlipidemia,  prediabetes, positive RNP antibody and also elevated ANA, had non-ischemic stress test in May 2019. Due to worsening dyspnea on exertion and abnormal EKG with marked T wave abnormality in anterolateral leads, underwent coronary CTA & repeat echocardiogram in 2021 revealing moderate non-obstructive CAD normal LVEF.   Dyspnea on exertion Imdur 19m sent to pharmacy Stress test grossly positive for reversible defect Coronary CT [place] risk in her age group Will proceed with cardiac catheterization given abnormal stress and symptoms  Schedule for cardiac catheterization, and possible angioplasty. We discussed regarding risks, benefits, alternatives to this including stress testing, CTA and continued medical therapy. Patient  wants to proceed. Understands <1-2% risk of death, stroke, MI, urgent CABG, bleeding, infection, renal failure but not limited to these. Patient instructed not to do heavy lifting, heavy exertional activity, swimming until evaluation is complete.  Patient instructed to call if symptoms worse or to go to the ED for further evaluation.   Mixed hyperlipidemia Stop lipitor and start crestor   Essential hypertension Continue current cardiac medications. Encourage low-sodium diet, less than 2000 mg daily.    Follow-up in 1-2 months, sooner if needed.   Kristine Flock DO, FStewart Webster Hospital11/28/2023, 11:39 AM Office: [phone]

## 2022-10-20 ENCOUNTER — Encounter (HOSPITAL_COMMUNITY): Payer: Self-pay | Admitting: Cardiology

## 2022-10-20 ENCOUNTER — Telehealth: Payer: Self-pay

## 2022-10-20 LAB — POCT I-STAT 7, (LYTES, BLD GAS, ICA,H+H)
Acid-base deficit: 2 mmol/L (ref 0.0–2.0)
Bicarbonate: 22.4 mmol/L (ref 20.0–28.0)
Calcium, Ion: 1.22 mmol/L (ref 1.15–1.40)
HCT: 29 % — ABNORMAL LOW (ref 36.0–46.0)
Hemoglobin: 9.9 g/dL — ABNORMAL LOW (ref 12.0–15.0)
O2 Saturation: 95 %
Potassium: 3.6 mmol/L (ref 3.5–5.1)
Sodium: 142 mmol/L (ref 135–145)
TCO2: 24 mmol/L (ref 22–32)
pCO2 arterial: 37.8 mmHg (ref 32–48)
pH, Arterial: 7.382 (ref 7.35–7.45)
pO2, Arterial: 79 mmHg — ABNORMAL LOW (ref 83–108)

## 2022-10-20 MED FILL — Nitroglycerin IV Soln 200 MCG/ML in D5W: INTRAVENOUS | Qty: 250 | Status: AC

## 2022-10-20 NOTE — Telephone Encounter (Signed)
Pt aware

## 2022-10-20 NOTE — Telephone Encounter (Signed)
No lifting more than 5lbs for 2 weeks but can return to work otherwise

## 2022-10-20 NOTE — Telephone Encounter (Signed)
Patient asked when she can return to work after her cath yesterday?

## 2022-10-21 LAB — POCT I-STAT EG7
Acid-base deficit: 2 mmol/L (ref 0.0–2.0)
Bicarbonate: 23.4 mmol/L (ref 20.0–28.0)
Calcium, Ion: 1.23 mmol/L (ref 1.15–1.40)
HCT: 29 % — ABNORMAL LOW (ref 36.0–46.0)
Hemoglobin: 9.9 g/dL — ABNORMAL LOW (ref 12.0–15.0)
O2 Saturation: 67 %
Potassium: 3.6 mmol/L (ref 3.5–5.1)
Sodium: 142 mmol/L (ref 135–145)
TCO2: 25 mmol/L (ref 22–32)
pCO2, Ven: 40.2 mmHg — ABNORMAL LOW (ref 44–60)
pH, Ven: 7.374 (ref 7.25–7.43)
pO2, Ven: 35 mmHg (ref 32–45)

## 2022-10-21 LAB — POCT ACTIVATED CLOTTING TIME: Activated Clotting Time: 315 seconds

## 2022-10-30 NOTE — Patient Instructions (Signed)
SURGICAL WAITING ROOM VISITATION Patients having surgery or a procedure may have no more than 2 support people in the waiting area - these visitors may rotate in the visitor waiting room.   Children under the age of 85 must have an adult with them who is not the patient. If the patient needs to stay at the hospital during part of their recovery, the visitor guidelines for inpatient rooms apply.  PRE-OP VISITATION  Pre-op nurse will coordinate an appropriate time for 1 support person to accompany the patient in pre-op.  This support person may not rotate.  This visitor will be contacted when the time is appropriate for the visitor to come back in the pre-op area.  Please refer to the [facility] website for the visitor guidelines for Inpatients (after your surgery is over and you are in a regular room).  You are not required to quarantine at this time prior to your surgery. However, you must do this: Hand Hygiene often Do NOT share personal items Notify your provider if you are in close contact with someone who has COVID or you develop fever 100.4 or greater, new onset of sneezing, cough, sore throat, shortness of breath or body aches.  If you test positive for Covid or have been in contact with anyone that has tested positive in the last 10 days please notify you surgeon.    Your procedure is scheduled on:  Tuesday November 09, 2022  Report to [facility] Main Entrance: [place] entrance where the [employer] is available.   Report to admitting at: 10:15  AM  +++++Call this number if you have any questions or problems the morning of surgery [phone]  Do not eat food after Midnight the night prior to your surgery/procedure.  After Midnight you may have the following liquids until   09:45  AM DAY OF SURGERY  Clear Liquid Diet Water Black Coffee (sugar ok, NO MILK/CREAM OR CREAMERS)  Tea (sugar ok, NO MILK/CREAM OR CREAMERS) regular and decaf                              Plain Jell-O  with no fruit (NO RED)                                           Fruit ices (not with fruit pulp, NO RED)                                     Popsicles (NO RED)                                                                  Juice: apple, WHITE grape, WHITE cranberry Sports drinks like Gatorade or Powerade (NO RED)                    The day of surgery:  Drink ONE (1) Pre-Surgery G2 at   09:45 AM the morning of surgery. Drink in one sitting. Do not sip.  This drink was given to  you during your hospital pre-op appointment visit. Nothing else to drink after completing the Pre-Surgery G2 : No candy, chewing gum or throat lozenges.    FOLLOW ANY ADDITIONAL PRE OP INSTRUCTIONS YOU RECEIVED FROM YOUR SURGEON'S OFFICE!!!   Oral Hygiene is also important to reduce your risk of infection.        Remember - BRUSH YOUR TEETH THE MORNING OF SURGERY WITH YOUR REGULAR TOOTHPASTE  Take ONLY these medicines the morning of surgery with A SIP OF WATER: Metoprolol, and you may use your Simbrinza eye drops and Albuterol inhaler if needed.  If You have been diagnosed with Sleep Apnea - Bring CPAP mask and tubing day of surgery. We will provide you with a CPAP machine on the day of your surgery.                   You may not have any metal on your body including hair pins, jewelry, and body piercing  Do not wear make-up, lotions, powders, perfumes or deodorant  Do not wear nail polish including gel and S&S, artificial / acrylic nails, or any other type of covering on natural nails including finger and toenails. If you have artificial nails, gel coating, etc., that needs to be removed by a nail salon, Please have this removed prior to surgery. Not doing so may mean that your surgery could be cancelled or delayed if the Surgeon or anesthesia staff feels like they are unable to monitor you safely.   Do not shave 48 hours prior to surgery to avoid nicks in your skin which may contribute to  postoperative infections.   Contacts, Hearing Aids, dentures or bridgework may not be worn into surgery. DENTURES WILL BE REMOVED PRIOR TO SURGERY PLEASE DO NOT APPLY "Poly grip" OR ADHESIVES!!!  You may bring a small overnight bag with you on the day of surgery, only pack items that are not valuable. [facility] IS NOT RESPONSIBLE   FOR VALUABLES THAT ARE LOST OR STOLEN.   Do not bring your home medications to the hospital. The Pharmacy will dispense medications listed on your medication list to you during your admission in the Hospital.  Special Instructions: Bring a copy of your healthcare power of attorney and living will documents the day of surgery, if you wish to have them scanned into your [place] Medical Records- EPIC  Please read over the following fact sheets you were given: IF YOU HAVE QUESTIONS ABOUT YOUR PRE-OP INSTRUCTIONS, PLEASE CALL [phone]  ([place])   [facility] - Preparing for Surgery Before surgery, you can play an important role.  Because skin is not sterile, your skin needs to be as free of germs as possible.  You can reduce the number of germs on your skin by washing with CHG (chlorahexidine gluconate) soap before surgery.  CHG is an antiseptic cleaner which kills germs and bonds with the skin to continue killing germs even after washing. Please DO NOT use if you have an allergy to CHG or antibacterial soaps.  If your skin becomes reddened/irritated stop using the CHG and inform your nurse when you arrive at Short Stay. Do not shave (including legs and underarms) for at least 48 hours prior to the first CHG shower.  You may shave your face/neck.  Please follow these instructions carefully:  1.  Shower with CHG Soap the night before surgery and the  morning of surgery.  2.  If you choose to wash your hair, wash your hair first as usual  with your normal  shampoo.  3.  After you shampoo, rinse your hair and body thoroughly to remove the shampoo.                              4.  Use CHG as you would any other liquid soap.  You can apply chg directly to the skin and wash.  Gently with a scrungie or clean washcloth.  5.  Apply the CHG Soap to your body ONLY FROM THE NECK DOWN.   Do not use on face/ open                           Wound or open sores. Avoid contact with eyes, ears mouth and genitals (private parts).                       Wash face,  Genitals (private parts) with your normal soap.             6.  Wash thoroughly, paying special attention to the area where your  surgery  will be performed.  7.  Thoroughly rinse your body with warm water from the neck down.  8.  DO NOT shower/wash with your normal soap after using and rinsing off the CHG Soap.            9.  Pat yourself dry with a clean towel.            10.  Wear clean pajamas.            11.  Place clean sheets on your bed the night of your first shower and do not  sleep with pets.  ON THE DAY OF SURGERY : Do not apply any lotions/deodorants the morning of surgery.  Please wear clean clothes to the hospital/surgery center.    FAILURE TO FOLLOW THESE INSTRUCTIONS MAY RESULT IN THE CANCELLATION OF YOUR SURGERY  PATIENT SIGNATURE_________________________________  NURSE SIGNATURE__________________________________  ________________________________________________________________________        Adam Phenix    An incentive spirometer is a tool that can help keep your lungs clear and active. This tool measures how well you are filling your lungs with each breath. Taking long deep breaths may help reverse or decrease the chance of developing breathing (pulmonary) problems (especially infection) following: A long period of time when you are unable to move or be active. BEFORE THE PROCEDURE  If the spirometer includes an indicator to show your best effort, your nurse or respiratory therapist will set it to a desired goal. If possible, sit up straight or lean slightly forward. Try not  to slouch. Hold the incentive spirometer in an upright position. INSTRUCTIONS FOR USE  Sit on the edge of your bed if possible, or sit up as far as you can in bed or on a chair. Hold the incentive spirometer in an upright position. Breathe out normally. Place the mouthpiece in your mouth and seal your lips tightly around it. Breathe in slowly and as deeply as possible, raising the piston or the ball toward the top of the column. Hold your breath for 3-5 seconds or for as long as possible. Allow the piston or ball to fall to the bottom of the column. Remove the mouthpiece from your mouth and breathe out normally. Rest for a few seconds and repeat Steps 1 through 7 at least 10 times every 1-2 hours  when you are awake. Take your time and take a few normal breaths between deep breaths. The spirometer may include an indicator to show your best effort. Use the indicator as a goal to work toward during each repetition. After each set of 10 deep breaths, practice coughing to be sure your lungs are clear. If you have an incision (the cut made at the time of surgery), support your incision when coughing by placing a pillow or rolled up towels firmly against it. Once you are able to get out of bed, walk around indoors and cough well. You may stop using the incentive spirometer when instructed by your caregiver.  RISKS AND COMPLICATIONS Take your time so you do not get dizzy or light-headed. If you are in pain, you may need to take or ask for pain medication before doing incentive spirometry. It is harder to take a deep breath if you are having pain. AFTER USE Rest and breathe slowly and easily. It can be helpful to keep track of a log of your progress. Your caregiver can provide you with a simple table to help with this. If you are using the spirometer at home, follow these instructions: [place] IF:  You are having difficultly using the spirometer. You have trouble using the spirometer as often  as instructed. Your pain medication is not giving enough relief while using the spirometer. You develop fever of 100.5 F (38.1 C) or higher.                                                                                                    SEEK IMMEDIATE MEDICAL CARE IF:  You cough up bloody sputum that had not been present before. You develop fever of 102 F (38.9 C) or greater. You develop worsening pain at or near the incision site. MAKE SURE YOU:  Understand these instructions. Will watch your condition. Will get help right away if you are not doing well or get worse. Document Released: 03/14/2007 Document Revised: 01/24/2012 Document Reviewed: 05/15/2007 [facility] Patient Information 2014 [place], [place].

## 2022-10-30 NOTE — Progress Notes (Signed)
COVID Vaccine received:  _0  No _1  Yes Date of any COVID positive Test in last 90 days:  PCP - Glendale Chard MD Cardiologist [facility] Cardiology, Dr. Virgina Jock  Clearance for surgery in his 10-19-2022 Cath Note Pulmonology: Christinia Gully, MD Sleep Med.- Dr. Asencion Partridge Dohmeier  Chest x-ray - 08-28-22 EKG -  10-19-22 Stress Test - 10-04-22 ECHO - 10-04-22 Cardiac Cath - [facility] on 10-19-22 by Patwardhan  PCR screen: _2  Ordered & Completed                      _3   No Order but Needs PROFEND                      _4   N/A for this surgery  Surgery Plan:  _5  Ambulatory                            _6  Outpatient in bed                            _7  Admit  Anesthesia:    _8  General  _9  Spinal                           _10   Choice _11   MAC  Pacemaker / ICD device _12  No _13  Yes        Device order form faxed _14  No    _15   Yes      Faxed to:  Spinal Cord Stimulator:_16  No _17  Yes      (Remind patient to bring remote DOS) Other Implants:   History of Sleep Apnea? _18  No _19  Yes   CPAP used?- _20  No _21  Yes    Does the patient monitor blood sugar? _22  No _23  Yes  _24  N/A   Pre-DM Does patient have a [employer] or Dexacom? _25  No _26  Yes   Fasting Blood Sugar Ranges-  Checks Blood Sugar _____ times a day  Blood Thinner / Instructions: none Aspirin Instructions:ASA 29m  ERAS Protocol Ordered: _27  No  _28  Yes PRE-SURGERY _29  ENSURE  _30  G2  Patient is to be NPO after: 09:45  am  Comments: Patient had CBC Diff and CMP 14+ on 10-12-2022.  Activity level: Patient can / can not climb a flight of stairs without difficulty; _31  No CP  _32  No SOB, but would have ______   Patient can / can not perform ADLs without assistance.   Anesthesia review: Sarcoidosis, DOE, Asthma, HTN, OSA, Pre-DM  Patient denies shortness of breath, fever, cough and chest pain at PAT appointment.  Patient verbalized understanding and agreement to the Pre-Surgical Instructions that were given to them at this PAT  appointment. Patient was also educated of the need to review these PAT instructions again prior to his/her surgery.I reviewed the appropriate phone numbers to call if they have any and questions or concerns.

## 2022-11-01 ENCOUNTER — Encounter (HOSPITAL_COMMUNITY)
Admission: RE | Admit: 2022-11-01 | Discharge: 2022-11-01 | Disposition: A | Discharge status: Home / Self Care | Payer: BC Managed Care – PPO | Source: Ambulatory Visit | Attending: Orthopedic Surgery | Admitting: Orthopedic Surgery

## 2022-11-01 ENCOUNTER — Encounter (HOSPITAL_COMMUNITY): Payer: Self-pay

## 2022-11-01 ENCOUNTER — Other Ambulatory Visit: Payer: Self-pay

## 2022-11-01 VITALS — BP 144/89 | HR 77 | Temp 97.6°F | Resp 22 | Ht 65.5 in | Wt 212.0 lb

## 2022-11-01 DIAGNOSIS — R7303 Prediabetes: Secondary | ICD-10-CM | POA: Insufficient documentation

## 2022-11-01 DIAGNOSIS — Z01812 Encounter for preprocedural laboratory examination: Secondary | ICD-10-CM | POA: Insufficient documentation

## 2022-11-01 DIAGNOSIS — Z01818 Encounter for other preprocedural examination: Secondary | ICD-10-CM

## 2022-11-01 LAB — SURGICAL PCR SCREEN
MRSA, PCR: NEGATIVE
Staphylococcus aureus: NEGATIVE

## 2022-11-01 LAB — GLUCOSE, CAPILLARY: Glucose-Capillary: 101 mg/dL — ABNORMAL HIGH (ref 70–99)

## 2022-11-02 ENCOUNTER — Ambulatory Visit: Payer: BC Managed Care – PPO | Admitting: Nurse Practitioner

## 2022-11-02 ENCOUNTER — Encounter: Payer: Self-pay | Admitting: Nurse Practitioner

## 2022-11-02 VITALS — BP 142/98 | HR 76 | Temp 97.6°F | Ht 65.0 in | Wt 210.2 lb

## 2022-11-02 DIAGNOSIS — I119 Hypertensive heart disease without heart failure: Secondary | ICD-10-CM

## 2022-11-02 DIAGNOSIS — I700 Atherosclerosis of aorta: Secondary | ICD-10-CM

## 2022-11-02 DIAGNOSIS — I25118 Atherosclerotic heart disease of native coronary artery with other forms of angina pectoris: Secondary | ICD-10-CM | POA: Diagnosis not present

## 2022-11-02 DIAGNOSIS — R051 Acute cough: Secondary | ICD-10-CM | POA: Diagnosis not present

## 2022-11-02 DIAGNOSIS — Z6834 Body mass index (BMI) 34.0-34.9, adult: Secondary | ICD-10-CM

## 2022-11-02 DIAGNOSIS — R0981 Nasal congestion: Secondary | ICD-10-CM | POA: Diagnosis not present

## 2022-11-02 DIAGNOSIS — E6609 Other obesity due to excess calories: Secondary | ICD-10-CM

## 2022-11-02 LAB — HEMOGLOBIN A1C
Hgb A1c MFr Bld: 5.6 % (ref 4.8–5.6)
Mean Plasma Glucose: 114 mg/dL

## 2022-11-02 MED ORDER — AMOXICILLIN-POT CLAVULANATE 875-125 MG PO TABS: 1.0000 | ORAL_TABLET | Freq: Two times a day (BID) | ORAL | 0 refills | Status: DC

## 2022-11-02 MED ORDER — HYDROCODONE BIT-HOMATROP MBR 5-1.5 MG/5ML PO SOLN: 5.0000 mL | Freq: Four times a day (QID) | ORAL | 0 refills | Status: DC | PRN

## 2022-11-02 MED ORDER — BENZONATATE 100 MG PO CAPS: 100.0000 mg | ORAL_CAPSULE | Freq: Four times a day (QID) | ORAL | 1 refills | Status: DC | PRN

## 2022-11-02 MED ORDER — CEFTRIAXONE SODIUM 1 G IJ SOLR
1.0000 g | Freq: Once | INTRAMUSCULAR | Status: AC
  Administered 2022-11-02: 1 g via INTRAMUSCULAR

## 2022-11-02 NOTE — Progress Notes (Signed)
I,Victoria T Hamilton,acting as a Education administrator for Minette Brine, FNP.,have documented all relevant documentation on the behalf of Minette Brine, FNP,as directed by  Minette Brine, FNP while in the presence of Minette Brine, [place].    Subjective:     Patient ID: Kristine [name] , female    DOB: Nov 04, 1951 , 71 y.o.   MRN: [id number]   Chief Complaint  Patient presents with   URI    HPI  Pt presents today with cold symptoms. Initially started week before last on Tuesday.  She experiences cough, chest and head congestion & sob  She has a follow up with cardiologist tomorrow for catherization. She believes contracting this from her grandson Saturday before last. She states having covid the week of Nov 13th.   She also has knee replacement surgery coming up next week, 12/26. She is taking mucinex dm but does not seem to help.     URI  This is a new problem. There has been no fever. Pertinent negatives include no abdominal pain, chest pain, congestion or headaches.     Past Medical History:  Diagnosis Date   Allergy    Arthritis    Asthma    Back pain    Complication of anesthesia    slow to wake up   Dyspnea    Glaucoma    ROD   Heart murmur    Hypertension    Joint pain    Knee pain    Obesity    Sarcoidosis    SOB (shortness of breath)      Family History  Problem Relation Age of Onset   Breast cancer Mother    Lung cancer Mother    Lung cancer Father        smoked   Heart disease Maternal Grandmother    Hypertension Brother    Liver cancer Brother    Pneumonia Brother      Current Outpatient Medications:    albuterol (VENTOLIN HFA) 108 (90 Base) MCG/ACT inhaler, INHALE TWO PUFFS BY MOUTH EVERY 6 HOURS AS NEEDED FOR WHEEZING OR SHORTNESS OF BREATH, Disp: 8.5 g, Rfl: 1   atorvastatin (LIPITOR) 10 MG tablet, Take 10 mg by mouth daily., Disp: , Rfl:    benzonatate (TESSALON PERLES) 100 MG capsule, Take 1 capsule (100 mg total) by mouth every 6 (six) hours as needed.,  Disp: 30 capsule, Rfl: 1   Brinzolamide-Brimonidine (SIMBRINZA) 1-0.2 % SUSP, Place 1 drop into both eyes daily., Disp: , Rfl:    CALCIUM PO, Take 600 mg by mouth daily., Disp: , Rfl:    Cholecalciferol (VITAMIN D3) 5000 UNITS CAPS, Take 5,000 Units by mouth daily., Disp: , Rfl:    Magnesium 400 MG CAPS, Take 400 mg by mouth daily., Disp: , Rfl:    metoprolol succinate (TOPROL-XL) 25 MG 24 hr tablet, TAKE ONE TABLET BY MOUTH DAILY, Disp: 90 tablet, Rfl: 1   Multiple Vitamin (MULTIVITAMIN) capsule, Take 1 capsule by mouth daily., Disp: , Rfl:    telmisartan (MICARDIS) 80 MG tablet, Take 1 tablet (80 mg total) by mouth daily., Disp: 90 tablet, Rfl: 1   acetaminophen (TYLENOL) 500 MG tablet, Take 2 tablets (1,000 mg total) by mouth every 6 (six) hours., Disp: 30 tablet, Rfl: 0   aspirin 81 MG chewable tablet, Chew 1 tablet (81 mg total) by mouth 2 (two) times daily for 28 days., Disp: 56 tablet, Rfl: 0   celecoxib (CELEBREX) 200 MG capsule, Take 1 capsule (200 mg total) by mouth  daily., Disp: 30 capsule, Rfl: 0   methocarbamol (ROBAXIN) 500 MG tablet, Take 1 tablet (500 mg total) by mouth every 6 (six) hours as needed for muscle spasms., Disp: 40 tablet, Rfl: 2   oxyCODONE (OXY IR/ROXICODONE) 5 MG immediate release tablet, Take 1 tablet (5 mg total) by mouth every 4 (four) hours as needed for severe pain., Disp: 42 tablet, Rfl: 0   polyethylene glycol (MIRALAX / GLYCOLAX) 17 g packet, Take 17 g by mouth 2 (two) times daily., Disp: 14 each, Rfl: 0   rosuvastatin (CRESTOR) 20 MG tablet, Take 1 tablet (20 mg total) by mouth daily., Disp: 90 tablet, Rfl: 3   senna (SENOKOT) 8.6 MG TABS tablet, Take 1 tablet (8.6 mg total) by mouth at bedtime for 14 days., Disp: 14 tablet, Rfl: 0   Allergies  Allergen Reactions   Crestor [Rosuvastatin Calcium] Other (See Comments)    Pt believes this may have been causing headaches, not sure if it was this or isosorbide mononitrate   Isosorbide     Pt believes this may  have been causing headaches, not sure if it was this or crestor     Review of Systems  Constitutional: Negative.   HENT:  Negative for congestion.   Respiratory: Negative.    Cardiovascular: Negative.  Negative for chest pain.  Gastrointestinal:  Negative for abdominal pain.  Neurological: Negative.  Negative for headaches.  Psychiatric/Behavioral: Negative.       Today's Vitals   11/02/22 1101  BP: (!) 142/98  Pulse: 76  Temp: 97.6 F (36.4 C)  SpO2: 98%  Weight: 210 lb 3.2 oz (95.3 kg)  Height: _0  (1.651 m)   Body mass index is 34.98 kg/m.  Wt Readings from Last 3 Encounters:  11/09/22 213 lb 3.2 oz (96.7 kg)  11/03/22 213 lb 3.2 oz (96.7 kg)  11/02/22 210 lb 3.2 oz (95.3 kg)    Objective:  Physical Exam Vitals reviewed.  Constitutional:      General: She is not in acute distress.    Appearance: Normal appearance. She is well-developed. She is obese.  HENT:     Head: Normocephalic and atraumatic.     Nose: Nose normal.     Mouth/Throat:     Mouth: Mucous membranes are moist.  Eyes:     Pupils: Pupils are equal, round, and reactive to light.  Cardiovascular:     Rate and Rhythm: Normal rate and regular rhythm.     Pulses: Normal pulses.     Heart sounds: Normal heart sounds. No murmur heard. Pulmonary:     Effort: Pulmonary effort is normal. No respiratory distress.     Breath sounds: Normal breath sounds. No wheezing.  Musculoskeletal:        General: Normal range of motion.  Skin:    General: Skin is warm and dry.     Capillary Refill: Capillary refill takes less than 2 seconds.  Neurological:     General: No focal deficit present.     Mental Status: She is alert and oriented to person, place, and time.     Cranial Nerves: No cranial nerve deficit.  Psychiatric:        Mood and Affect: Mood normal.         Assessment And Plan:     1. Acute cough Comments: Will test her for repiratory panel. Will also treat with antibiotic Rocephin IM since she  is to have knee surgery - Respiratory Panel w/ SARS-CoV2 - cefTRIAXone (ROCEPHIN)  injection 1 g  2. Congestion of nasal sinus - Respiratory Panel w/ SARS-CoV2 - cefTRIAXone (ROCEPHIN) injection 1 g  3. Hypertensive heart disease without heart failure Comments: Blood pressure is elevated today, advised to take HBP coricidan brand medications  4. Coronary artery disease involving native coronary artery of native heart with other form of angina pectoris ([place]) Comments: Continue statin, tolerating well.  5. Aortic atherosclerosis (HCC)  6. Class 1 obesity due to excess calories with body mass index (BMI) of 34.0 to 34.9 in adult, unspecified whether serious comorbidity present She is encouraged to strive for BMI less than 30 to decrease cardiac risk. Advised to aim for at least 150 minutes of exercise per week.    Patient was given opportunity to ask questions. Patient verbalized understanding of the plan and was able to repeat key elements of the plan. All questions were answered to their satisfaction.  Minette Brine, FNP   I, Minette Brine, FNP, have reviewed all documentation for this visit. The documentation on 11/02/22 for the exam, diagnosis, procedures, and orders are all accurate and complete.   IF YOU HAVE BEEN REFERRED TO A SPECIALIST, IT MAY TAKE 1-2 WEEKS TO SCHEDULE/PROCESS THE REFERRAL. IF YOU HAVE NOT HEARD FROM US/SPECIALIST IN TWO WEEKS, PLEASE GIVE [place] A CALL AT [phone] X [phone].   THE PATIENT IS ENCOURAGED TO PRACTICE SOCIAL DISTANCING DUE TO THE COVID-19 PANDEMIC.

## 2022-11-03 ENCOUNTER — Ambulatory Visit: Payer: BC Managed Care – PPO | Admitting: Internal Medicine

## 2022-11-03 ENCOUNTER — Encounter: Payer: Self-pay | Admitting: Internal Medicine

## 2022-11-03 VITALS — BP 125/82 | HR 87 | Ht 65.0 in | Wt 213.2 lb

## 2022-11-03 DIAGNOSIS — E782 Mixed hyperlipidemia: Secondary | ICD-10-CM

## 2022-11-03 DIAGNOSIS — D862 Sarcoidosis of lung with sarcoidosis of lymph nodes: Secondary | ICD-10-CM

## 2022-11-03 DIAGNOSIS — I10 Essential (primary) hypertension: Secondary | ICD-10-CM

## 2022-11-03 LAB — RESPIRATORY PANEL W/ SARS-COV2

## 2022-11-03 NOTE — Progress Notes (Signed)
Primary Physician/Referring:  Glendale Chard, MD  Patient ID: Kristine [name], female    DOB: July 19, 1951, 71 y.o.   MRN: [id number]  Chief Complaint  Patient presents with  . Hypertension  . Follow-up  . Shortness of Breath  . surgical clearance   HPI:    Kristine [name]  is a 71 y.o. 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 Christinia Gully, morbid obesity, severe obstructive sleep apnea on CPAP since August 2018 and follows Dr. Roddie Mc, hypertension, mild hyperlipidemia,  prediabetes, positive RNP antibody and also elevated ANA, had non-ischemic stress test in May 2019. Due to worsening dyspnea on exertion and abnormal EKG with marked T wave abnormality in anterolateral leads, underwent coronary CTA & repeat echocardiogram in 2021 revealing moderate non-obstructive CAD normal LVEF.   Patient is here for a follow-up visit. She had a diagnostic cardiac catheterization since our last visit which showed non-obstructive CAD. Her positive stress test was likely due to uncontrolled blood pressure. Patient is feeling well now that her blood pressure is controlled. She has stopped the new cholesterol med and went back on her old one since she developed headaches. Otherwise, she is going for knee surgery before end of the year. Patient has been cleared from cardiac standpoint. She is still short of breath and should investigate her pulmonary sarcoidosis with her pulmonologist as extensive cardiac work-up was negative.  Denies leg swelling, orthopnea, PND, syncope, near syncope, palpitations.  Past Medical History:  Diagnosis Date  . Allergy   . Arthritis   . Asthma   . Back pain   . Complication of anesthesia    slow to wake up  . Dyspnea   . Glaucoma    ROD  . Heart murmur   . Hypertension   . Joint pain   . Knee pain   . Obesity   . Sarcoidosis   . SOB (shortness of breath)    Past Surgical History:  Procedure Laterality Date  . ABDOMINAL HYSTERECTOMY    .  BUNIONECTOMY     left  . CHOLECYSTECTOMY    . INTRAVASCULAR PRESSURE WIRE/FFR STUDY N/A 10/19/2022   Procedure: INTRAVASCULAR PRESSURE WIRE/FFR STUDY;  Surgeon: Nigel Mormon, MD;  Location: [facility];  Service: Cardiovascular;  Laterality: N/A;  . KNEE SURGERY     right  . LEFT HEART CATHETERIZATION WITH CORONARY ANGIOGRAM N/A 09/12/2014   Procedure: LEFT HEART CATHETERIZATION WITH CORONARY ANGIOGRAM;  Surgeon: Troy Sine, MD;  Location: [facility] CATH LAB;  Service: Cardiovascular;  Laterality: N/A;  . RIGHT/LEFT HEART CATH AND CORONARY ANGIOGRAPHY N/A 10/19/2022   Procedure: RIGHT/LEFT HEART CATH AND CORONARY ANGIOGRAPHY;  Surgeon: Nigel Mormon, MD;  Location: [facility];  Service: Cardiovascular;  Laterality: N/A;  . TOTAL KNEE ARTHROPLASTY Right 03/09/2022   Procedure: TOTAL KNEE ARTHROPLASTY;  Surgeon: Paralee Cancel, MD;  Location: [facility];  Service: Orthopedics;  Laterality: Right;   Family History  Problem Relation Age of Onset  . Breast cancer Mother   . Lung cancer Mother   . Lung cancer Father        smoked  . Heart disease Maternal Grandmother   . Hypertension Brother   . Liver cancer Brother   . Pneumonia Brother    Social History   Tobacco Use  . Smoking status: Never  . Smokeless tobacco: Never  Substance Use Topics  . Alcohol use: Yes    Comment: occ  Marital Status: Married  ROS  Review of Systems  Cardiovascular:  Positive for dyspnea on exertion. Negative for chest pain, claudication, leg swelling, near-syncope, orthopnea, palpitations, paroxysmal nocturnal dyspnea and syncope.  Respiratory:  Positive for shortness of breath.   Gastrointestinal:  Negative for melena.  Neurological:  Negative for dizziness.    Objective  Blood pressure 125/82, pulse 87, height _0  (1.651 m), weight 213 lb 3.2 oz (96.7 kg), SpO2 99 %.     11/03/2022   10:53 AM 11/02/2022   11:01 AM 11/01/2022    1:08 PM  Vitals with BMI  Height _1  _2   5' 5.5"  Weight 213 lbs 3 oz 210 lbs 3 oz 212 lbs  BMI 35.48 14.48 18.56  Systolic 314 970 263  Diastolic 82 98 89  Pulse 87 76 77     Physical Exam Vitals reviewed.  Neck:     Thyroid: No thyromegaly.  Cardiovascular:     Rate and Rhythm: Normal rate and regular rhythm.     Pulses: Intact distal pulses.     Heart sounds: Normal heart sounds, S1 normal and S2 normal. No murmur heard.    No gallop.     Comments: No leg edema, no JVD. Pulmonary:     Effort: Pulmonary effort is normal. No respiratory distress.     Breath sounds: Normal breath sounds. No wheezing, rhonchi or rales.  Musculoskeletal:     Right lower leg: No edema.     Left lower leg: No edema.  Skin:    General: Skin is warm and dry.  Neurological:     Mental Status: She is alert.   Laboratory examination:   Recent Labs    02/24/22 1349 03/10/22 0315 09/22/22 1219 10/12/22 1222 10/19/22 1444 10/19/22 1445  NA 140 141 144 146* 142 142  K 4.0 3.8 4.3 4.3 3.6 3.6  CL 110 110 104 108*  --   --   CO2 _3 --   --   GLUCOSE 88 124* 78 87  --   --   BUN _4 --   --   CREATININE 0.70 1.01* 0.84 0.84  --   --   CALCIUM 9.7 8.6* 9.5 9.4  --   --   GFRNONAA >60 >60  --   --   --   --    CrCl cannot be calculated (Patient's most recent lab result is older than the maximum 21 days allowed.).     Latest Ref Rng & Units 10/19/2022    2:45 PM 10/19/2022    2:44 PM 10/12/2022   12:22 PM  CMP  Glucose 70 - 99 mg/dL   87   BUN 8 - 27 mg/dL   14   Creatinine 0.57 - 1.00 mg/dL   0.84   Sodium 135 - 145 mmol/L 142  142  146   Potassium 3.5 - 5.1 mmol/L 3.6  3.6  4.3   Chloride 96 - 106 mmol/L   108   CO2 20 - 29 mmol/L   26   Calcium 8.7 - 10.3 mg/dL   9.4   Total Protein 6.0 - 8.5 g/dL   6.6   Total Bilirubin 0.0 - 1.2 mg/dL   0.2   Alkaline Phos 44 - 121 IU/L   60   AST 0 - 40 IU/L   15   ALT 0 - 32 IU/L   12       Latest Ref Rng & Units 10/19/2022  2:45 PM 10/19/2022    2:44 PM  10/12/2022   12:22 PM  CBC  WBC 3.4 - 10.8 x10E3/uL   6.1   Hemoglobin 12.0 - 15.0 g/dL 9.9  9.9  11.3   Hematocrit 36.0 - 46.0 % 29.0  29.0  34.9   Platelets 150 - 450 x10E3/uL   215    Lipid Panel     Component Value Date/Time   CHOL 130 11/12/2021 1501   TRIG 82 11/12/2021 1501   HDL 57 11/12/2021 1501   CHOLHDL 2.3 11/12/2021 1501   CHOLHDL 4.1 04/05/2013 0858   VLDL 15 04/05/2013 0858   LDLCALC 57 11/12/2021 1501   HEMOGLOBIN A1C Lab Results  Component Value Date   HGBA1C 5.6 11/01/2022   MPG 114 11/01/2022   TSH No results for input(s): "TSH" in the last 8760 hours.  BNP    Component Value Date/Time   BNP 17.4 01/14/2021 1616    ProBNP    Component Value Date/Time   PROBNP 34.0 07/30/2016 1623     External labs  03/14/2018: Cholesterol 175, triglycerides 78, HDL 47, LDL 112. Sedimentation rate 16.  12/27/2017: Hemoglobin A1c 6.2%. Creatinine 0.92, EGFR 76/66, potassium 4.0, BMP normal. Vitamin B12 308.  05/02/2017: Anti--DNA less than 1. RNP antibodies 1.1 elevated. Sjorgens negative. ANA direct positive. TSH 2.6. Vitamin D 39.  03/07/2017: Cholesterol 183, triglycerides 66, HDL 56, LDL 114.  Allergies   Allergies  Allergen Reactions  . Crestor [Rosuvastatin Calcium]     Pt believes this may have been causing headaches, not sure if it was this or isosorbide mononitrate  . Isosorbide     Pt believes this may have been causing headaches, not sure if it was this or crestor     Medications Prior to Visit:   Outpatient Medications Prior to Visit  Medication Sig Dispense Refill  . albuterol (VENTOLIN HFA) 108 (90 Base) MCG/ACT inhaler INHALE TWO PUFFS BY MOUTH EVERY 6 HOURS AS NEEDED FOR WHEEZING OR SHORTNESS OF BREATH 8.5 g 1  . amoxicillin-clavulanate (AUGMENTIN) 875-125 MG tablet Take 1 tablet by mouth 2 (two) times daily. 14 tablet 0  . aspirin EC 81 MG tablet Take 81 mg by mouth daily.    Marland Kitchen atorvastatin (LIPITOR) 10 MG tablet Take 10 mg by mouth  daily.    . benzonatate (TESSALON PERLES) 100 MG capsule Take 1 capsule (100 mg total) by mouth every 6 (six) hours as needed. 30 capsule 1  . Brinzolamide-Brimonidine (SIMBRINZA) 1-0.2 % SUSP Place 1 drop into both eyes daily.    Marland Kitchen CALCIUM PO Take 600 mg by mouth daily.    . Cholecalciferol (VITAMIN D3) 5000 UNITS CAPS Take 5,000 Units by mouth daily.    Marland Kitchen HYDROcodone bit-homatropine (HYDROMET) 5-1.5 MG/5ML syrup Take 5 mLs by mouth every 6 (six) hours as needed for cough. 120 mL 0  . Magnesium 400 MG CAPS Take 400 mg by mouth daily.    . metoprolol succinate (TOPROL-XL) 25 MG 24 hr tablet TAKE ONE TABLET BY MOUTH DAILY 90 tablet 1  . Multiple Vitamin (MULTIVITAMIN) capsule Take 1 capsule by mouth daily.    . rosuvastatin (CRESTOR) 20 MG tablet Take 1 tablet (20 mg total) by mouth daily. 90 tablet 3  . telmisartan (MICARDIS) 80 MG tablet Take 1 tablet (80 mg total) by mouth daily. 90 tablet 1  . isosorbide mononitrate (IMDUR) 30 MG 24 hr tablet Take 1 tablet (30 mg total) by mouth daily. (Patient not taking: Reported on 10/28/2022)  90 tablet 3   No facility-administered medications prior to visit.   Final Medications at End of Visit    Current Meds  Medication Sig  . albuterol (VENTOLIN HFA) 108 (90 Base) MCG/ACT inhaler INHALE TWO PUFFS BY MOUTH EVERY 6 HOURS AS NEEDED FOR WHEEZING OR SHORTNESS OF BREATH  . amoxicillin-clavulanate (AUGMENTIN) 875-125 MG tablet Take 1 tablet by mouth 2 (two) times daily.  Marland Kitchen aspirin EC 81 MG tablet Take 81 mg by mouth daily.  Marland Kitchen atorvastatin (LIPITOR) 10 MG tablet Take 10 mg by mouth daily.  . benzonatate (TESSALON PERLES) 100 MG capsule Take 1 capsule (100 mg total) by mouth every 6 (six) hours as needed.  . Brinzolamide-Brimonidine (SIMBRINZA) 1-0.2 % SUSP Place 1 drop into both eyes daily.  Marland Kitchen CALCIUM PO Take 600 mg by mouth daily.  . Cholecalciferol (VITAMIN D3) 5000 UNITS CAPS Take 5,000 Units by mouth daily.  Marland Kitchen HYDROcodone bit-homatropine (HYDROMET)  5-1.5 MG/5ML syrup Take 5 mLs by mouth every 6 (six) hours as needed for cough.  . Magnesium 400 MG CAPS Take 400 mg by mouth daily.  . metoprolol succinate (TOPROL-XL) 25 MG 24 hr tablet TAKE ONE TABLET BY MOUTH DAILY  . Multiple Vitamin (MULTIVITAMIN) capsule Take 1 capsule by mouth daily.  . rosuvastatin (CRESTOR) 20 MG tablet Take 1 tablet (20 mg total) by mouth daily.  Marland Kitchen telmisartan (MICARDIS) 80 MG tablet Take 1 tablet (80 mg total) by mouth daily.   Radiology:   Chest x-ray PA and lateral view 12/08/2017: No acute pneumonia nor CHF. Bilateral hilar lymphadenopathy and mild chronic interstitial prominence consistent with known sarcoidosis.  Cardiac Studies:   Coronary angiogram 09/12/2014: Normal LV systolic function at 92%, 40% ostial stenosis of the LAD, otherwise normal coronary arteries.  Exercise sestamibi stress test 03/20/2018: 1. The patient performed treadmill exercise using a Bruce protocol, completing 4:49 minutes. The patient completed an estimated workload of 6.81 METS, reaching 105% of the maximum predicted heart rate. No stress symptoms reported. Peak BP 220/98 mmHg. No ischemic changes seen on stress electrocardiogram. 2. The overall quality of the study is good. There is no evidence of abnormal lung activity. Stress and rest SPECT images demonstrate homogeneous tracer distribution throughout the myocardium. Gated SPECT imaging reveals normal myocardial thickening and wall motion. The left ventricular ejection fraction was normal (56%). 3. Low risk study.  Echocardiogram 12/07/2019:  Left ventricle cavity is normal in size and wall thickness. Normal LV systolic function with EF 56%. Normal global wall motion. Unable to evaluate diastolic function due to severity of mitral annular calcification.  Calculated EF 56%.  Left atrial cavity is severely dilated.  Severe calcification of the mitral valve annulus. Mild (Grade I) mitral regurgitation.  Mild tricuspid regurgitation.  Estimated pulmonary artery systolic pressure is 30 mmHg.  No significant change compared to previous study on 04/04/2018.   Coronary CTA 01/25/2020:  1. Coronary calcium score of 201. This was 89th percentile for age and sex matched control. 2. Normal coronary origin with right dominance. 3.  Mild (25-49%) calcified plaque in a small D1. 4.  Moderate (50-69%) mixed plaque in the mid LCX and OM2. 5.  Will send study for FFRct. 6.  Moderate mitral annular calcification.  FFR findings consistent with nonobstructive coronary artery disease.  Extracardiac findings: Extensive mediastinal and bilateral hilar lymphadenopathy, compatible with reported clinical history of sarcoidosis. A few scattered tiny 2-4 mm pulmonary nodules noted in the lungs bilaterally, nonspecific, but statistically likely benign in this patient with history  of sarcoidosis.   PCV ECHOCARDIOGRAM COMPLETE 94/85/4627 Normal LV systolic function with visual EF 55-60%. Left ventricle cavity is normal in size. The left ventricle is normal thickness, septal bulge. Normal global wall motion. Unable to evaluate diastolic function due to severity of mitral annular  calcification. Elevated LAP. Severe calcification of the mitral valve annulus. Native mitral valve with trace regurgitation. No evidence of mitral valve stenosis. Compared to prior study dated 12/07/2019 no significant change.  LM: Normal LAD: Tortuous Diag 2 ostial 80% stenosis Lcx: Very tortuous        Mid focal 70% stenosis. RFR 0.93 (physiologically non-significant) RCA: No significant disease   RA: 4 mmHg RV: 40/7 mmHg PA: 28/12 mmHg, mPAP 18 mmHg PCW: Unable to obtain due to tortuosity   CO: 6.2 L/min CI: 3.0 L/min/m2   Moderate nonobstructive coronary artery disease Normal filling pressures No pulmonary hypertension   Consider non-cardiac cause for exertional dyspnea Low cardiac risk for upcoming knee surgery  EKG   09/07/2022: Sinus Rhythm Left  axis -anterior fascicular block. LVH criteria met, iLBBB. No significant change compared to prior  02/03/2022: Sinus rhythm with borderline first-degree AV block at a rate of 84 bpm.  Left axis, left anterior fascicular block.  LVH with secondary ST-T wave abnormalities, cannot exclude anterolateral ischemia.  Unchanged compared to EKG 12/26/2019, 01/13/2021.  EKG 01/13/2021: Sinus rhythm rate of 76 bpm.  Left axis, left anterior fascicular block.  Poor progression, cannot exclude anteroseptal infarct old.  LVH with secondary ST-T abnormalities, cannot exclude anterolateral ischemia.  EKG 12/26/2019: Normal sinus rhythm with rate of 67 bpm, left axis deviation, left anterior fascicular block.  IVCD, incomplete left bundle branch block.  T wave normality, cannot exclude anterolateral ischemia.   Compared to EKG 03/13/2018, anterolateral T inversion new.  Assessment     ICD-10-CM   1. Mixed hyperlipidemia  E78.2     2. Essential hypertension  I10     3. 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 Melvyn Novas  D86.2        No orders of the defined types were placed in this encounter.   There are no discontinued medications.     Recommendations:   Kristine [name]  is a 71 y.o. 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 Christinia Gully, morbid obesity, severe obstructive sleep apnea on CPAP since August 2018 and follows Dr. Roddie Mc, hypertension, mild hyperlipidemia,  prediabetes, positive RNP antibody and also elevated ANA, had non-ischemic stress test in May 2019. Due to worsening dyspnea on exertion and abnormal EKG with marked T wave abnormality in anterolateral leads, underwent coronary CTA & repeat echocardiogram in 2021 revealing moderate non-obstructive CAD normal LVEF.     Essential hypertension Continue current cardiac medications. Encourage low-sodium diet, less than 2000 mg daily. 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 Melvyn Novas SOB/DOE is not due to cardiac etiology - echo, stress test, and diagnostic cardiac catheterization were all negative for obstructive CAD Patient should follow-up with her pulmonologist given history of pulmonary sarcoidosis and persistent SOB Positive nuclear stress test was likely false positive due to uncontrolled blood pressure She is clear for knee surgery and is low risk from cardiac standpoint   Follow-up in 6 months, sooner if needed.   Kristine Mares Garron Eline,  DO, [facility] 11/03/2022, 12:52 PM Office: [phone]

## 2022-11-04 ENCOUNTER — Other Ambulatory Visit: Payer: BC Managed Care – PPO

## 2022-11-04 ENCOUNTER — Other Ambulatory Visit: Payer: Self-pay | Admitting: Internal Medicine

## 2022-11-04 ENCOUNTER — Other Ambulatory Visit: Payer: Self-pay

## 2022-11-04 DIAGNOSIS — R051 Acute cough: Secondary | ICD-10-CM

## 2022-11-04 DIAGNOSIS — R0981 Nasal congestion: Secondary | ICD-10-CM

## 2022-11-04 MED ORDER — HYDROCODONE BIT-HOMATROP MBR 5-1.5 MG/5ML PO SOLN: 5.0000 mL | Freq: Four times a day (QID) | ORAL | 0 refills | Status: DC | PRN

## 2022-11-05 ENCOUNTER — Telehealth: Payer: Self-pay

## 2022-11-05 NOTE — Telephone Encounter (Signed)
Called patient to see how she is doing. Patient reports she is doing fine. Per RS if patients cough worsens over the weekend, she was notified to go to the ER.

## 2022-11-08 LAB — RESPIRATORY PANEL W/ SARS-COV2

## 2022-11-09 ENCOUNTER — Encounter (HOSPITAL_COMMUNITY)
Admission: RE | Disposition: A | Discharge status: Home / Self Care | Payer: Self-pay | Source: Ambulatory Visit | Attending: Orthopedic Surgery

## 2022-11-09 ENCOUNTER — Ambulatory Visit (HOSPITAL_COMMUNITY): Payer: BC Managed Care – PPO | Admitting: Physician Assistant

## 2022-11-09 ENCOUNTER — Encounter (HOSPITAL_COMMUNITY): Payer: Self-pay | Admitting: Orthopedic Surgery

## 2022-11-09 ENCOUNTER — Ambulatory Visit (HOSPITAL_COMMUNITY): Payer: BC Managed Care – PPO | Admitting: Anesthesiology

## 2022-11-09 ENCOUNTER — Other Ambulatory Visit: Payer: Self-pay

## 2022-11-09 ENCOUNTER — Observation Stay (HOSPITAL_COMMUNITY)
Admission: RE | Admit: 2022-11-09 | Discharge: 2022-11-10 | Disposition: A | Discharge status: Home / Self Care | Payer: BC Managed Care – PPO | Source: Ambulatory Visit | Attending: Orthopedic Surgery | Admitting: Orthopedic Surgery

## 2022-11-09 DIAGNOSIS — Z79899 Other long term (current) drug therapy: Secondary | ICD-10-CM | POA: Diagnosis not present

## 2022-11-09 DIAGNOSIS — Z7982 Long term (current) use of aspirin: Secondary | ICD-10-CM | POA: Diagnosis not present

## 2022-11-09 DIAGNOSIS — J45909 Unspecified asthma, uncomplicated: Secondary | ICD-10-CM | POA: Diagnosis not present

## 2022-11-09 DIAGNOSIS — Y792 Prosthetic and other implants, materials and accessory orthopedic devices associated with adverse incidents: Secondary | ICD-10-CM | POA: Insufficient documentation

## 2022-11-09 DIAGNOSIS — Z96651 Presence of right artificial knee joint: Secondary | ICD-10-CM | POA: Diagnosis not present

## 2022-11-09 DIAGNOSIS — T8482XS Fibrosis due to internal orthopedic prosthetic devices, implants and grafts, sequela: Secondary | ICD-10-CM | POA: Insufficient documentation

## 2022-11-09 DIAGNOSIS — M1712 Unilateral primary osteoarthritis, left knee: Secondary | ICD-10-CM | POA: Diagnosis not present

## 2022-11-09 DIAGNOSIS — Z96652 Presence of left artificial knee joint: Secondary | ICD-10-CM

## 2022-11-09 DIAGNOSIS — I2510 Atherosclerotic heart disease of native coronary artery without angina pectoris: Secondary | ICD-10-CM | POA: Insufficient documentation

## 2022-11-09 DIAGNOSIS — I10 Essential (primary) hypertension: Secondary | ICD-10-CM | POA: Diagnosis not present

## 2022-11-09 DIAGNOSIS — R7303 Prediabetes: Secondary | ICD-10-CM

## 2022-11-09 HISTORY — PX: KNEE CLOSED REDUCTION: SHX995

## 2022-11-09 HISTORY — PX: KNEE SURGERY: SHX244

## 2022-11-09 HISTORY — PX: TOTAL KNEE ARTHROPLASTY: SHX125

## 2022-11-09 LAB — GLUCOSE, CAPILLARY: Glucose-Capillary: 93 mg/dL (ref 70–99)

## 2022-11-09 SURGERY — ARTHROPLASTY, KNEE, TOTAL
Anesthesia: Spinal | Site: Knee | Laterality: Right

## 2022-11-09 MED ORDER — DEXAMETHASONE SODIUM PHOSPHATE 10 MG/ML IJ SOLN
INTRAMUSCULAR | Status: AC
  Filled 2022-11-09: qty 1

## 2022-11-09 MED ORDER — BRIMONIDINE TARTRATE 0.2 % OP SOLN
1.0000 [drp] | Freq: Every day | OPHTHALMIC | Status: DC
  Filled 2022-11-09: qty 5

## 2022-11-09 MED ORDER — SODIUM CHLORIDE 0.9 % IR SOLN
Status: DC | PRN
  Administered 2022-11-09: 1000 mL

## 2022-11-09 MED ORDER — FENTANYL CITRATE PF 50 MCG/ML IJ SOSY
PREFILLED_SYRINGE | INTRAMUSCULAR | Status: AC
  Administered 2022-11-09: 50 ug via INTRAVENOUS
  Filled 2022-11-09: qty 2

## 2022-11-09 MED ORDER — ONDANSETRON HCL 4 MG/2ML IJ SOLN: 4.0000 mg | Freq: Four times a day (QID) | INTRAMUSCULAR | Status: DC | PRN

## 2022-11-09 MED ORDER — ACETAMINOPHEN 10 MG/ML IV SOLN: 1000.0000 mg | Freq: Once | INTRAVENOUS | Status: DC | PRN

## 2022-11-09 MED ORDER — FENTANYL CITRATE PF 50 MCG/ML IJ SOSY: 25.0000 ug | PREFILLED_SYRINGE | Freq: Once | INTRAMUSCULAR | Status: AC

## 2022-11-09 MED ORDER — PROPOFOL 500 MG/50ML IV EMUL
INTRAVENOUS | Status: DC | PRN
  Administered 2022-11-09: 100 ug/kg/min via INTRAVENOUS

## 2022-11-09 MED ORDER — HYDROMORPHONE HCL 1 MG/ML IJ SOLN: 0.2500 mg | INTRAMUSCULAR | Status: DC | PRN

## 2022-11-09 MED ORDER — TRANEXAMIC ACID-NACL 1000-0.7 MG/100ML-% IV SOLN
1000.0000 mg | Freq: Once | INTRAVENOUS | Status: AC
  Administered 2022-11-09: 1000 mg via INTRAVENOUS
  Filled 2022-11-09: qty 100

## 2022-11-09 MED ORDER — METOPROLOL SUCCINATE ER 25 MG PO TB24
25.0000 mg | ORAL_TABLET | Freq: Every day | ORAL | Status: DC
  Administered 2022-11-10: 25 mg via ORAL
  Filled 2022-11-09: qty 1

## 2022-11-09 MED ORDER — DOCUSATE SODIUM 100 MG PO CAPS
100.0000 mg | ORAL_CAPSULE | Freq: Two times a day (BID) | ORAL | Status: DC
  Administered 2022-11-09 – 2022-11-10 (×2): 100 mg via ORAL
  Filled 2022-11-09 (×2): qty 1

## 2022-11-09 MED ORDER — ONDANSETRON HCL 4 MG/2ML IJ SOLN
INTRAMUSCULAR | Status: AC
  Filled 2022-11-09: qty 2

## 2022-11-09 MED ORDER — METHOCARBAMOL 500 MG PO TABS
500.0000 mg | ORAL_TABLET | Freq: Four times a day (QID) | ORAL | Status: DC | PRN
  Administered 2022-11-09: 500 mg via ORAL
  Filled 2022-11-09: qty 1

## 2022-11-09 MED ORDER — METHYLPREDNISOLONE ACETATE 40 MG/ML IJ SUSP
INTRAMUSCULAR | Status: AC
  Filled 2022-11-09: qty 1

## 2022-11-09 MED ORDER — POLYETHYLENE GLYCOL 3350 17 G PO PACK
17.0000 g | PACK | Freq: Two times a day (BID) | ORAL | Status: DC
  Administered 2022-11-09 – 2022-11-10 (×2): 17 g via ORAL
  Filled 2022-11-09: qty 1

## 2022-11-09 MED ORDER — METOCLOPRAMIDE HCL 5 MG/ML IJ SOLN: 5.0000 mg | Freq: Three times a day (TID) | INTRAMUSCULAR | Status: DC | PRN

## 2022-11-09 MED ORDER — IRBESARTAN 150 MG PO TABS
300.0000 mg | ORAL_TABLET | Freq: Every day | ORAL | Status: DC
  Administered 2022-11-10: 300 mg via ORAL
  Filled 2022-11-09: qty 2

## 2022-11-09 MED ORDER — STERILE WATER FOR IRRIGATION IR SOLN
Status: DC | PRN
  Administered 2022-11-09: 2000 mL

## 2022-11-09 MED ORDER — PROPOFOL 10 MG/ML IV BOLUS
INTRAVENOUS | Status: DC | PRN
  Administered 2022-11-09: 20 mg via INTRAVENOUS

## 2022-11-09 MED ORDER — BUPIVACAINE-EPINEPHRINE (PF) 0.5% -1:200000 IJ SOLN
INTRAMUSCULAR | Status: AC
  Filled 2022-11-09: qty 30

## 2022-11-09 MED ORDER — ONDANSETRON HCL 4 MG/2ML IJ SOLN: 4.0000 mg | Freq: Once | INTRAMUSCULAR | Status: DC | PRN

## 2022-11-09 MED ORDER — DEXAMETHASONE SODIUM PHOSPHATE 4 MG/ML IJ SOLN
INTRAMUSCULAR | Status: DC | PRN
  Administered 2022-11-09: 8 mg via INTRAVENOUS

## 2022-11-09 MED ORDER — HYDROMORPHONE HCL 1 MG/ML IJ SOLN: 0.5000 mg | INTRAMUSCULAR | Status: DC | PRN

## 2022-11-09 MED ORDER — MIDAZOLAM HCL 2 MG/2ML IJ SOLN: 0.5000 mg | Freq: Once | INTRAMUSCULAR | Status: AC

## 2022-11-09 MED ORDER — CEFAZOLIN SODIUM-DEXTROSE 2-4 GM/100ML-% IV SOLN
2.0000 g | Freq: Four times a day (QID) | INTRAVENOUS | Status: AC
  Administered 2022-11-09 – 2022-11-10 (×2): 2 g via INTRAVENOUS
  Filled 2022-11-09 (×2): qty 100

## 2022-11-09 MED ORDER — CELECOXIB 200 MG PO CAPS
200.0000 mg | ORAL_CAPSULE | Freq: Every day | ORAL | Status: DC
  Administered 2022-11-10: 200 mg via ORAL
  Filled 2022-11-09: qty 1

## 2022-11-09 MED ORDER — PROPOFOL 1000 MG/100ML IV EMUL
INTRAVENOUS | Status: AC
  Filled 2022-11-09: qty 100

## 2022-11-09 MED ORDER — ONDANSETRON HCL 4 MG/2ML IJ SOLN
INTRAMUSCULAR | Status: DC | PRN
  Administered 2022-11-09: 4 mg via INTRAVENOUS

## 2022-11-09 MED ORDER — DIPHENHYDRAMINE HCL 12.5 MG/5ML PO ELIX: 12.5000 mg | ORAL_SOLUTION | ORAL | Status: DC | PRN

## 2022-11-09 MED ORDER — METHOCARBAMOL 500 MG IVPB - SIMPLE MED
INTRAVENOUS | Status: AC
  Filled 2022-11-09: qty 55

## 2022-11-09 MED ORDER — BUPIVACAINE-EPINEPHRINE 0.5% -1:200000 IJ SOLN
INTRAMUSCULAR | Status: DC | PRN
  Administered 2022-11-09: 30 mL

## 2022-11-09 MED ORDER — ATORVASTATIN CALCIUM 10 MG PO TABS
10.0000 mg | ORAL_TABLET | Freq: Every day | ORAL | Status: DC
  Administered 2022-11-09 – 2022-11-10 (×2): 10 mg via ORAL
  Filled 2022-11-09 (×2): qty 1

## 2022-11-09 MED ORDER — SODIUM CHLORIDE (PF) 0.9 % IJ SOLN
INTRAMUSCULAR | Status: DC | PRN
  Administered 2022-11-09: 30 mL

## 2022-11-09 MED ORDER — 0.9 % SODIUM CHLORIDE (POUR BTL) OPTIME
TOPICAL | Status: DC | PRN
  Administered 2022-11-09: 1000 mL

## 2022-11-09 MED ORDER — BUPIVACAINE IN DEXTROSE 0.75-8.25 % IT SOLN
INTRATHECAL | Status: DC | PRN
  Administered 2022-11-09: 1.4 mL via INTRATHECAL

## 2022-11-09 MED ORDER — DEXAMETHASONE SODIUM PHOSPHATE 10 MG/ML IJ SOLN
10.0000 mg | Freq: Once | INTRAMUSCULAR | Status: AC
  Administered 2022-11-10: 10 mg via INTRAVENOUS
  Filled 2022-11-09: qty 1

## 2022-11-09 MED ORDER — ORAL CARE MOUTH RINSE: 15.0000 mL | Freq: Once | OROMUCOSAL | Status: AC

## 2022-11-09 MED ORDER — MENTHOL 3 MG MT LOZG: 1.0000 | LOZENGE | OROMUCOSAL | Status: DC | PRN

## 2022-11-09 MED ORDER — ATORVASTATIN CALCIUM 10 MG PO TABS: 10.0000 mg | ORAL_TABLET | Freq: Every day | ORAL | Status: DC

## 2022-11-09 MED ORDER — OXYCODONE HCL 5 MG PO TABS
5.0000 mg | ORAL_TABLET | ORAL | Status: DC | PRN
  Administered 2022-11-09 – 2022-11-10 (×2): 10 mg via ORAL
  Filled 2022-11-09 (×2): qty 2

## 2022-11-09 MED ORDER — ONDANSETRON HCL 4 MG PO TABS: 4.0000 mg | ORAL_TABLET | Freq: Four times a day (QID) | ORAL | Status: DC | PRN

## 2022-11-09 MED ORDER — TRANEXAMIC ACID-NACL 1000-0.7 MG/100ML-% IV SOLN
1000.0000 mg | INTRAVENOUS | Status: AC
  Administered 2022-11-09: 1000 mg via INTRAVENOUS
  Filled 2022-11-09: qty 100

## 2022-11-09 MED ORDER — OXYCODONE HCL 5 MG PO TABS: 10.0000 mg | ORAL_TABLET | ORAL | Status: DC | PRN

## 2022-11-09 MED ORDER — ASPIRIN 81 MG PO CHEW
81.0000 mg | CHEWABLE_TABLET | Freq: Two times a day (BID) | ORAL | Status: DC
  Administered 2022-11-09 – 2022-11-10 (×2): 81 mg via ORAL
  Filled 2022-11-09 (×2): qty 1

## 2022-11-09 MED ORDER — METHOCARBAMOL 500 MG IVPB - SIMPLE MED
500.0000 mg | Freq: Four times a day (QID) | INTRAVENOUS | Status: DC | PRN
  Administered 2022-11-09: 500 mg via INTRAVENOUS

## 2022-11-09 MED ORDER — BRINZOLAMIDE 1 % OP SUSP
1.0000 [drp] | Freq: Three times a day (TID) | OPHTHALMIC | Status: DC
  Filled 2022-11-09: qty 10

## 2022-11-09 MED ORDER — ROPIVACAINE HCL 5 MG/ML IJ SOLN
INTRAMUSCULAR | Status: DC | PRN
  Administered 2022-11-09 (×6): 5 mL via PERINEURAL

## 2022-11-09 MED ORDER — ACETAMINOPHEN 325 MG PO TABS: 325.0000 mg | ORAL_TABLET | Freq: Four times a day (QID) | ORAL | Status: DC | PRN

## 2022-11-09 MED ORDER — METOCLOPRAMIDE HCL 5 MG PO TABS: 5.0000 mg | ORAL_TABLET | Freq: Three times a day (TID) | ORAL | Status: DC | PRN

## 2022-11-09 MED ORDER — SODIUM CHLORIDE 0.9 % IV SOLN: INTRAVENOUS | Status: DC

## 2022-11-09 MED ORDER — PHENOL 1.4 % MT LIQD: 1.0000 | OROMUCOSAL | Status: DC | PRN

## 2022-11-09 MED ORDER — ROSUVASTATIN CALCIUM 20 MG PO TABS: 20.0000 mg | ORAL_TABLET | Freq: Every day | ORAL | Status: DC

## 2022-11-09 MED ORDER — LIDOCAINE HCL (PF) 2 % IJ SOLN
INTRAMUSCULAR | Status: AC
  Filled 2022-11-09: qty 5

## 2022-11-09 MED ORDER — LACTATED RINGERS IV SOLN: INTRAVENOUS | Status: DC

## 2022-11-09 MED ORDER — BUPIVACAINE HCL (PF) 0.5 % IJ SOLN
INTRAMUSCULAR | Status: AC
  Filled 2022-11-09: qty 30

## 2022-11-09 MED ORDER — ACETAMINOPHEN 500 MG PO TABS
1000.0000 mg | ORAL_TABLET | Freq: Four times a day (QID) | ORAL | Status: DC
  Administered 2022-11-09 – 2022-11-10 (×3): 1000 mg via ORAL
  Filled 2022-11-09 (×3): qty 2

## 2022-11-09 MED ORDER — BISACODYL 10 MG RE SUPP: 10.0000 mg | Freq: Every day | RECTAL | Status: DC | PRN

## 2022-11-09 MED ORDER — KETOROLAC TROMETHAMINE 30 MG/ML IJ SOLN
INTRAMUSCULAR | Status: DC | PRN
  Administered 2022-11-09: 30 mg

## 2022-11-09 MED ORDER — POVIDONE-IODINE 10 % EX SWAB: 2.0000 | Freq: Once | CUTANEOUS | Status: DC

## 2022-11-09 MED ORDER — DEXAMETHASONE SODIUM PHOSPHATE 10 MG/ML IJ SOLN: 8.0000 mg | Freq: Once | INTRAMUSCULAR | Status: DC

## 2022-11-09 MED ORDER — CHLORHEXIDINE GLUCONATE 0.12 % MT SOLN
15.0000 mL | Freq: Once | OROMUCOSAL | Status: AC
  Administered 2022-11-09: 15 mL via OROMUCOSAL

## 2022-11-09 MED ORDER — OXYCODONE HCL 5 MG PO TABS
ORAL_TABLET | ORAL | Status: AC
  Administered 2022-11-09: 10 mg via ORAL
  Filled 2022-11-09: qty 2

## 2022-11-09 MED ORDER — ALBUTEROL SULFATE (2.5 MG/3ML) 0.083% IN NEBU: 3.0000 mL | INHALATION_SOLUTION | Freq: Four times a day (QID) | RESPIRATORY_TRACT | Status: DC | PRN

## 2022-11-09 MED ORDER — MIDAZOLAM HCL 2 MG/2ML IJ SOLN
INTRAMUSCULAR | Status: AC
  Administered 2022-11-09: 1 mg via INTRAVENOUS
  Filled 2022-11-09: qty 2

## 2022-11-09 MED ORDER — DEXAMETHASONE SODIUM PHOSPHATE 4 MG/ML IJ SOLN
INTRAMUSCULAR | Status: DC | PRN
  Administered 2022-11-09: 4 mg via PERINEURAL

## 2022-11-09 MED ORDER — KETOROLAC TROMETHAMINE 30 MG/ML IJ SOLN
INTRAMUSCULAR | Status: AC
  Filled 2022-11-09: qty 1

## 2022-11-09 MED ORDER — SODIUM CHLORIDE (PF) 0.9 % IJ SOLN
INTRAMUSCULAR | Status: AC
  Filled 2022-11-09: qty 30

## 2022-11-09 MED ORDER — CLONIDINE HCL (ANALGESIA) 100 MCG/ML EP SOLN
EPIDURAL | Status: DC | PRN
  Administered 2022-11-09: 100 ug

## 2022-11-09 MED ORDER — CEFAZOLIN SODIUM-DEXTROSE 2-4 GM/100ML-% IV SOLN
2.0000 g | INTRAVENOUS | Status: AC
  Administered 2022-11-09: 2 g via INTRAVENOUS
  Filled 2022-11-09: qty 100

## 2022-11-09 SURGICAL SUPPLY — 68 items
ADH SKN CLS APL DERMABOND .7 (GAUZE/BANDAGES/DRESSINGS) ×2
ATTUNE MED ANAT PAT 35 KNEE (Knees) IMPLANT
ATTUNE PS FEM LT SZ 4 CEM KNEE (Femur) IMPLANT
ATTUNE PSRP INSR SZ4 7 KNEE (Insert) IMPLANT
BAG COUNTER SPONGE SURGICOUNT (BAG) IMPLANT
BAG SPEC THK2 15X12 ZIP CLS (MISCELLANEOUS)
BAG SPNG CNTER NS LX DISP (BAG) ×2
BAG ZIPLOCK 12X15 (MISCELLANEOUS) IMPLANT
BASE TIBIAL ROT PLAT SZ 3 KNEE (Knees) IMPLANT
BLADE SAW SGTL 11.0X1.19X90.0M (BLADE) IMPLANT
BLADE SAW SGTL 13.0X1.19X90.0M (BLADE) ×2 IMPLANT
BNDG ADH 1X3 SHEER STRL LF (GAUZE/BANDAGES/DRESSINGS) IMPLANT
BNDG ADH THN 3X1 STRL LF (GAUZE/BANDAGES/DRESSINGS) ×2
BNDG CMPR MED 10X6 ELC LF (GAUZE/BANDAGES/DRESSINGS) ×2
BNDG ELASTIC 6X10 VLCR STRL LF (GAUZE/BANDAGES/DRESSINGS) IMPLANT
BNDG ELASTIC 6X5.8 VLCR STR LF (GAUZE/BANDAGES/DRESSINGS) ×2 IMPLANT
BOWL SMART MIX CTS (DISPOSABLE) ×2 IMPLANT
BSPLAT TIB 3 CMNT ROT PLAT STR (Knees) ×2 IMPLANT
CEMENT BONE DEPUY (Cement) IMPLANT
CEMENT HV SMART SET (Cement) IMPLANT
COVER SURGICAL LIGHT HANDLE (MISCELLANEOUS) ×2 IMPLANT
CUFF TOURN SGL QUICK 34 (TOURNIQUET CUFF)
CUFF TRNQT CYL 34X4.125X (TOURNIQUET CUFF) ×2 IMPLANT
DERMABOND ADVANCED .7 DNX12 (GAUZE/BANDAGES/DRESSINGS) ×2 IMPLANT
DRAPE U-SHAPE 47X51 STRL (DRAPES) ×2 IMPLANT
DRESSING AQUACEL AG SP 3.5X10 (GAUZE/BANDAGES/DRESSINGS) ×2 IMPLANT
DRSG AQUACEL AG ADV 3.5X10 (GAUZE/BANDAGES/DRESSINGS) IMPLANT
DRSG AQUACEL AG SP 3.5X10 (GAUZE/BANDAGES/DRESSINGS) ×2
DURAPREP 26ML APPLICATOR (WOUND CARE) ×4 IMPLANT
ELECT REM PT RETURN 15FT ADLT (MISCELLANEOUS) ×2 IMPLANT
GAUZE SPONGE 4X4 12PLY STRL (GAUZE/BANDAGES/DRESSINGS) IMPLANT
GLOVE BIO SURGEON STRL SZ 6 (GLOVE) ×2 IMPLANT
GLOVE BIOGEL M 7.0 STRL (GLOVE) IMPLANT
GLOVE BIOGEL PI IND STRL 6.5 (GLOVE) ×2 IMPLANT
GLOVE BIOGEL PI IND STRL 7.5 (GLOVE) ×6 IMPLANT
GLOVE BIOGEL PI IND STRL 8.5 (GLOVE) ×2 IMPLANT
GLOVE ECLIPSE 8.0 STRL XLNG CF (GLOVE) IMPLANT
GLOVE INDICATOR 6.5 STRL GRN (GLOVE) ×2 IMPLANT
GLOVE ORTHO TXT STRL SZ7.5 (GLOVE) ×4 IMPLANT
GLOVE SURG ORTHO 8.0 STRL STRW (GLOVE) ×2 IMPLANT
GOWN STRL REUS W/ TWL LRG LVL3 (GOWN DISPOSABLE) ×4 IMPLANT
GOWN STRL REUS W/TWL LRG LVL3 (GOWN DISPOSABLE) ×4
HANDPIECE INTERPULSE COAX TIP (DISPOSABLE) ×2
HOLDER FOLEY CATH W/STRAP (MISCELLANEOUS) IMPLANT
KIT TURNOVER KIT A (KITS) IMPLANT
MANIFOLD NEPTUNE II (INSTRUMENTS) ×2 IMPLANT
NDL SAFETY ECLIP 18X1.5 (MISCELLANEOUS) IMPLANT
NS IRRIG 1000ML POUR BTL (IV SOLUTION) ×2 IMPLANT
PACK TOTAL KNEE CUSTOM (KITS) ×2 IMPLANT
PIN FIX SIGMA LCS THRD HI (PIN) IMPLANT
PROTECTOR NERVE ULNAR (MISCELLANEOUS) ×2 IMPLANT
SET HNDPC FAN SPRY TIP SCT (DISPOSABLE) ×2 IMPLANT
SET PAD KNEE POSITIONER (MISCELLANEOUS) ×2 IMPLANT
SPIKE FLUID TRANSFER (MISCELLANEOUS) ×4 IMPLANT
SUT MNCRL AB 4-0 PS2 18 (SUTURE) ×2 IMPLANT
SUT STRATAFIX PDS+ 0 24IN (SUTURE) ×2 IMPLANT
SUT VIC AB 1 CT1 36 (SUTURE) ×2 IMPLANT
SUT VIC AB 2-0 CT1 27 (SUTURE) ×4
SUT VIC AB 2-0 CT1 TAPERPNT 27 (SUTURE) ×4 IMPLANT
SYR 3ML LL SCALE MARK (SYRINGE) ×2 IMPLANT
SYR CONTROL 10ML LL (SYRINGE) IMPLANT
TIBIAL BASE ROT PLAT SZ 3 KNEE (Knees) ×2 IMPLANT
TOWEL GREEN STERILE FF (TOWEL DISPOSABLE) ×2 IMPLANT
TOWEL OR 17X26 10 PK STRL BLUE (TOWEL DISPOSABLE) ×4 IMPLANT
TRAY FOLEY MTR SLVR 16FR STAT (SET/KITS/TRAYS/PACK) ×2 IMPLANT
TUBE SUCTION HIGH CAP CLEAR NV (SUCTIONS) ×2 IMPLANT
WATER STERILE IRR 1000ML POUR (IV SOLUTION) ×4 IMPLANT
WRAP KNEE MAXI GEL POST OP (GAUZE/BANDAGES/DRESSINGS) ×2 IMPLANT

## 2022-11-09 NOTE — Evaluation (Signed)
Physical Therapy Evaluation Patient Details Name: Kristine [name] MRN: [id number] DOB: [birth year] Today's Date: 11/09/2022  History of Present Illness  Pt is a 71yo female presenting s/p R closed manipulation and L-TKA on 11/09/22. PMH: OA, HTN, sarcoidosis, R-TKA 03/09/22, CAD.   Clinical Impression  [name] is a 71 y.o. female POD 0 s/p L-TKA and R knee closed manipulation. Patient reports independence with mobility at baseline. Patient is now limited by functional impairments (see PT problem list below) and requires min guard for bed mobility, further mobility deferred as pt reports decreased sensation in bilateral extremities, suspect secondary to spinal. Patient instructed in exercise to facilitate ROM and circulation to manage edema. Provided incentive spirometer and with Vcs pt able to achieve 1267m. Patient will benefit from continued skilled PT interventions to address impairments and progress towards PLOF. Acute PT will follow to progress mobility and stair training in preparation for safe discharge home.       Recommendations for follow up therapy are one component of a multi-disciplinary discharge planning process, led by the attending physician.  Recommendations may be updated based on patient status, additional functional criteria and insurance authorization.  Follow Up Recommendations Follow physician's recommendations for discharge plan and follow up therapies      Assistance Recommended at Discharge Frequent or constant Supervision/Assistance  Patient can return home with the following  A little help with walking and/or transfers;A little help with bathing/dressing/bathroom;Assistance with cooking/housework;Assist for transportation;Help with stairs or ramp for entrance    Equipment Recommendations None recommended by PT  Recommendations for Other Services       Functional Status Assessment Patient has had a recent decline in their functional status and  demonstrates the ability to make significant improvements in function in a reasonable and predictable amount of time.     Precautions / Restrictions Precautions Precautions: Fall;Knee Precaution Booklet Issued: No Precaution Comments: no pillow under the knee Restrictions Weight Bearing Restrictions: Yes RLE Weight Bearing: Weight bearing as tolerated LLE Weight Bearing: Weight bearing as tolerated Other Position/Activity Restrictions: Assume WBAT applies to BLE, message sent to PA via Secure Chat, no update prior to start of session      Mobility  Bed Mobility Overal bed mobility: Needs Assistance Bed Mobility: Supine to Sit, Sit to Supine     Supine to sit: Min guard, HOB elevated Sit to supine: Min guard   General bed mobility comments: Pt required min guard for safety, no physical assist required. Pt sat EOB for 553m and continued to report decreased sensation BLE feet and hips. Pt able to self-scoot toward head of bed while in sitting position with min guard and VCs for sequencing. Min guard for sit to supine, repositioned pt for comfort, further mobility deferred.    Transfers                   General transfer comment: deferred    Ambulation/Gait               General Gait Details: deferred  Stairs            Wheelchair Mobility    Modified Rankin (Stroke Patients Only)       Balance Overall balance assessment: Needs assistance Sitting-balance support: Feet supported, No upper extremity supported Sitting balance-Leahy Scale: Good         Standing balance comment: deferred  Pertinent Vitals/Pain Pain Assessment Pain Assessment: No/denies pain    Home Living Family/patient expects to be discharged to:: Private residence Living Arrangements: Spouse/significant other Available Help at Discharge: Family;Available 24 hours/day Type of Home: House Home Access: Stairs to enter Entrance  Stairs-Rails: None Entrance Stairs-Number of Steps: 2   Home Layout: One level Home Equipment: Rollator (4 wheels);Rolling Walker (2 wheels);Cane - single point;Shower seat;Toilet riser Additional Comments: son Marcelino Scot and daughter Tillie Rung present for session    Prior Function Prior Level of Function : Independent/Modified Independent             Mobility Comments: ind ADLs Comments: ind     Hand Dominance   Dominant Hand: Right    Extremity/Trunk Assessment   Upper Extremity Assessment Upper Extremity Assessment: Overall WFL for tasks assessed    Lower Extremity Assessment Lower Extremity Assessment: RLE deficits/detail;LLE deficits/detail RLE Deficits / Details: MMT ank DF/PF 5/5, unable to fire glutes RLE Sensation: decreased light touch LLE Deficits / Details: MMT ank DF/PF 5/5, No extensor lag noted, unable to fire glutes LLE Sensation: decreased light touch    Cervical / Trunk Assessment Cervical / Trunk Assessment: Kyphotic  Communication   Communication: No difficulties  Cognition Arousal/Alertness: Awake/alert Behavior During Therapy: WFL for tasks assessed/performed Overall Cognitive Status: Within Functional Limits for tasks assessed                                          General Comments General comments (skin integrity, edema, etc.): Husband, daughter, son present    Exercises Total Joint Exercises Ankle Circles/Pumps: AROM, 20 reps, Both   Assessment/Plan    PT Assessment Patient needs continued PT services  PT Problem List Decreased strength;Decreased range of motion;Decreased activity tolerance;Decreased balance;Decreased mobility;Decreased coordination;Pain       PT Treatment Interventions DME instruction;Gait training;Stair training;Functional mobility training;Therapeutic activities;Therapeutic exercise;Balance training;Neuromuscular re-education;Patient/family education    PT Goals (Current goals can be found in the  Care Plan section)  Acute Rehab PT Goals Patient Stated Goal: Clean my house PT Goal Formulation: With patient Time For Goal Achievement: 11/16/22 Potential to Achieve Goals: Good    Frequency 7X/week     Co-evaluation               AM-PAC PT "6 Clicks" Mobility  Outcome Measure Help needed turning from your back to your side while in a flat bed without using bedrails?: A Little Help needed moving from lying on your back to sitting on the side of a flat bed without using bedrails?: A Little Help needed moving to and from a bed to a chair (including a wheelchair)?: A Little Help needed standing up from a chair using your arms (e.g., wheelchair or bedside chair)?: A Little Help needed to walk in hospital room?: A Little Help needed climbing 3-5 steps with a railing? : A Little 6 Click Score: 18    End of Session Equipment Utilized During Treatment: Gait belt Activity Tolerance: Patient tolerated treatment well;No increased pain Patient left: in bed;with call bell/phone within reach;with bed alarm set;with family/visitor present;with SCD's reapplied Nurse Communication: Mobility status PT Visit Diagnosis: Pain;Difficulty in walking, not elsewhere classified (R26.2) Pain - Right/Left: Left Pain - part of body: Knee    Time: 9811-9147 PT Time Calculation (min) (ACUTE ONLY): 23 min   Charges:   PT Evaluation $PT Eval Low Complexity: 1 Low  Coolidge Breeze, PT, DPT [facility] Office: [phone] Weekend pager: [phone]  Coolidge Breeze 11/09/2022, 6:53 PM

## 2022-11-09 NOTE — Anesthesia Procedure Notes (Signed)
Anesthesia Regional Block: Adductor canal block   Pre-Anesthetic Checklist: , timeout performed,  Correct Patient, Correct Site, Correct Laterality,  Correct Procedure, Correct Position, site marked,  Risks and benefits discussed,  Surgical consent,  Pre-op evaluation,  At surgeon's request and post-op pain management  Laterality: Lower and Left  Prep: chloraprep       Needles:  Injection technique: Single-shot  Needle Type: Echogenic Stimulator Needle     Needle Length: 9cm  Needle Gauge: 20   Needle insertion depth: 2 cm   Additional Needles:   Procedures:,,,, ultrasound used (permanent image in chart),,    Narrative:  Start time: 11/09/2022 12:52 PM End time: 11/09/2022 12:59 PM Injection made incrementally with aspirations every 5 mL.  Performed by: Personally  Anesthesiologist: Lyn Hollingshead, MD

## 2022-11-09 NOTE — Anesthesia Postprocedure Evaluation (Signed)
Anesthesia Post Note  Patient: Kristine [name]  Procedure(s) Performed: TOTAL KNEE ARTHROPLASTY (Left: Knee) CLOSED MANIPULATION KNEE (Right: Knee)     Patient location during evaluation: PACU Anesthesia Type: Spinal Level of consciousness: awake Pain management: pain level controlled Vital Signs Assessment: post-procedure vital signs reviewed and stable Respiratory status: spontaneous breathing Cardiovascular status: stable Postop Assessment: no headache, no backache, spinal receding, patient able to bend at knees and no apparent nausea or vomiting Anesthetic complications: no  No notable events documented.  Last Vitals:  Vitals:   11/09/22 1615 11/09/22 1630  BP: (!) 141/82 (!) 146/80  Pulse: 66 67  Resp: 13 11  Temp:    SpO2: 90% 90%    Last Pain:  Vitals:   11/09/22 1615  TempSrc:   PainSc: 0-No pain                 Huston Foley

## 2022-11-09 NOTE — Discharge Instructions (Signed)

## 2022-11-09 NOTE — Plan of Care (Signed)

## 2022-11-09 NOTE — Transfer of Care (Signed)
Immediate Anesthesia Transfer of Care Note  Patient: Kristine [name]  Procedure(s) Performed: TOTAL KNEE ARTHROPLASTY (Left: Knee) CLOSED MANIPULATION KNEE (Right: Knee)  Patient Location: PACU  Anesthesia Type:Spinal  Level of Consciousness: awake, alert , and patient cooperative  Airway & Oxygen Therapy: Patient Spontanous Breathing and Patient connected to face mask oxygen  Post-op Assessment: Report given to RN and Post -op Vital signs reviewed and stable  Post vital signs: Reviewed and stable  Last Vitals:  Vitals Value Taken Time  BP    Temp    Pulse 70 11/09/22 1513  Resp 12 11/09/22 1513  SpO2 99 % 11/09/22 1513  Vitals shown include unvalidated device data.  Last Pain:  Vitals:   11/09/22 1100  TempSrc:   PainSc: 1          Complications: No notable events documented.

## 2022-11-09 NOTE — Op Note (Signed)
NAME:  Kristine [name]                      MEDICAL RECORD NO.:  [id number]                             FACILITY:  [facility]      PHYSICIAN:  Madlyn Frankel. Charlann Boxer, M.D.  DATE OF BIRTH:  Apr 02, 1951      DATE OF PROCEDURE:  11/09/2022                                     OPERATIVE REPORT         PREOPERATIVE DIAGNOSIS:  Left knee osteoarthritis. 2. Status post right total knee replacement with arthrofibrosis     POSTOPERATIVE DIAGNOSIS:  Left knee osteoarthritis. 2. Status post right total knee replacement with arthrofibrosis     FINDINGS:  The patient was noted to have complete loss of cartilage and   bone-on-bone arthritis with associated osteophytes in the lateral and patellofemoral compartments of   the knee with a significant synovitis and associated effusion.  The patient had failed months of conservative treatment including medications, injection therapy, activity modification.     PROCEDURE:  Left total knee replacement. 2. Right knee MUA     COMPONENTS USED:  DePuy Attune rotating platform posterior stabilized knee   system, a size 4 femur, 4 tibia, size 7 mm PS AOX insert, and 35 anatomic patellar   button.      SURGEON:  Madlyn Frankel. Charlann Boxer, M.D.      ASSISTANT:  Rosalene Billings, PA-C.      ANESTHESIA:  Regional and Spinal.      SPECIMENS:  None.      COMPLICATION:  None.      DRAINS:  None.  EBL: <100 cc      TOURNIQUET TIME:   Total Tourniquet Time Documented: Thigh (Left) - 31 minutes Total: Thigh (Left) - 31 minutes  .      The patient was stable to the recovery room.      INDICATION FOR PROCEDURE:  Kristine [name] is a 71 y.o. female patient of   mine.  The patient had been seen, evaluated, and treated for months conservatively in the   office with medication, activity modification, and injections.  The patient had   radiographic changes of bone-on-bone arthritis with endplate sclerosis and osteophytes noted.  Based on the radiographic changes and failed  conservative measures, the patient   decided to proceed with definitive treatment, total knee replacement.  Risks of infection, DVT, component failure, need for revision surgery, neurovascular injury were reviewed in the office setting.  The postop course was reviewed stressing the efforts to maximize post-operative satisfaction and function.  Consent was obtained for benefit of pain   relief.   She had a history of right total knee replacement approximately 5 months ago.  In the office we noted that she has had limited extension more so than her flexion capabilities.  Due to the fact that she was scheduled to have her left knee replaced we discussed spending the time in the operating room to try to manipulate her right knee to see if we could more fully evaluate her range of motion as well as see if we can improve her motion.  Risks of persistent arthrofibrosis reviewed.  Risks of  muscle tendon or bone injury reviewed.  Consent was obtained for this as well.     PROCEDURE IN DETAIL:  The patient was brought to the operative theater.   Once adequate anesthesia, preoperative antibiotics, 2 gm of Ancef,1 gm of Tranexamic Acid, and 10 mg of Decadron administered, the patient was positioned supine with a left thigh tourniquet placed.    Attention was first directed to her right knee.  Following establishment of adequate anesthesia we did perform a first timeout identifying this right knee.  Once this was done I evaluated her knee range of motion.  She lacked at least 10 degrees of extension and flexed close to 110 degrees.  Following this assessment I placed her heel onto my shoulder and applied a downward pressure through her knee.  I was able to stretch through some adhesions.  I was unable to get her leg fully extended however there was improvement with her extension.  I then flexed her hip and applied a pressure through her proximal tibia to flex her knee.  There was no significant lysis of adhesions  however I was able to flex her knee beyond 110 degrees.  I repeated these movements 3 different times stretching and each endpoint for 30 seconds or greater.  At this point I concluded the manipulation.  We then placed her heel onto a pad to try to keep it in extension during the case.  Attention was now directed to her left knee and knee replacement.  The left lower extremity was prepped and draped in sterile fashion.  A time-out was performed identifying the patient, planned procedure, and the appropriate extremity.      The left lower extremity was placed in the [facility] leg holder.  The leg was   exsanguinated, tourniquet elevated to 250 mmHg.  A midline incision was   made followed by median parapatellar arthrotomy.  Following initial   exposure, attention was first directed to the patella.  Precut   measurement was noted to be 24 mm.  I resected down to 13-14 mm and used a   35 anatomic patellar button to restore patellar height as well as cover the cut surface.      The lug holes were drilled and a metal shim was placed to protect the   patella from retractors and saw blade during the procedure.      At this point, attention was now directed to the femur.  The femoral   canal was opened with a drill, irrigated to try to prevent fat emboli.  An   intramedullary rod was passed at 3 degrees valgus, 12 mm of bone was   resected off the distal femur due to pre-operative flexion contracture.  Following this resection, the tibia was   subluxated anteriorly.  Using the extramedullary guide, 2 mm of bone was resected off   the proximal lateral tibia.  We confirmed the gap would be   stable medially and laterally with a size 5 spacer block as well as confirmed that the tibial cut was perpendicular in the coronal plane, checking with an alignment rod.      Once this was done, I sized the femur to be a size 4 in the anterior-   posterior dimension, chose a standard component based on medial and    lateral dimension.  The size 4 rotation block was then pinned in   position anterior referenced using the C-clamp to set rotation.  The   anterior, posterior, and  chamfer  cuts were made without difficulty nor   notching making certain that I was along the anterior cortex to help   with flexion gap stability.      The final box cut was made off the lateral aspect of distal femur.      At this point, the tibia was sized to be a size 4.  The size 4 tray was   then pinned in position through the medial third of the tubercle,   drilled, and keel punched.  Trial reduction was now carried with a 4 femur,  4 tibia, a size 7 mm PS insert, and the 35 anatomic patella botton.  The knee was brought to full extension with good flexion stability with the patella   tracking through the trochlea without application of pressure.  Given   all these findings the trial components removed.  Final components were   opened and cement was mixed.  The knee was irrigated with normal saline solution and pulse lavage.  The synovial lining was   then injected with 30 cc of 0.25% Marcaine with epinephrine, 1 cc of Toradol and 30 cc of NS for a total of 61 cc.     Final implants were then cemented onto cleaned and dried cut surfaces of bone with the knee brought to extension with a size 7 mm PS trial insert.      Once the cement had fully cured, excess cement was removed   throughout the knee.  I confirmed that I was satisfied with the range of   motion and stability, and the final size 7 mm PS AOX insert was chosen.  It was   placed into the knee.      The tourniquet had been let down at 31 minutes.  No significant   hemostasis was required.  The extensor mechanism was then reapproximated using #1 Vicryl and #1 Stratafix sutures with the knee   in flexion.  The   remaining wound was closed with 2-0 Vicryl and running 4-0 Monocryl.   The knee was cleaned, dried, dressed sterilely using Dermabond and   Aquacel  dressing.  The patient was then   brought to recovery room in stable condition, tolerating the procedure   well.   Please note that Physician Assistant, Rosalene Billings, PA-C was present for the entirety of the case, and was utilized for pre-operative positioning, peri-operative retractor management, general facilitation of the procedure and for primary wound closure at the end of the case.              Madlyn Frankel Charlann Boxer, M.D.    11/09/2022 2:52 PM

## 2022-11-09 NOTE — Anesthesia Preprocedure Evaluation (Signed)
Anesthesia Evaluation  Patient identified by MRN, date of birth, ID band Patient awake    Reviewed: Allergy & Precautions, NPO status , Patient's Chart, lab work & pertinent test results, reviewed documented beta blocker date and time   History of Anesthesia Complications (+) PROLONGED EMERGENCE and history of anesthetic complications  Airway Mallampati: II  TM Distance: >3 FB Neck ROM: Full    Dental no notable dental hx.    Pulmonary shortness of breath and with exertion, asthma    Pulmonary exam normal        Cardiovascular hypertension, Pt. on home beta blockers and Pt. on medications + CAD and + DOE  Normal cardiovascular exam     Neuro/Psych negative neurological ROS  negative psych ROS   GI/Hepatic Neg liver ROS,GERD  ,,  Endo/Other  negative endocrine ROS    Renal/GU negative Renal ROS  negative genitourinary   Musculoskeletal  (+) Arthritis , Osteoarthritis,    Abdominal  (+) + obese  Peds  Hematology negative hematology ROS (+)   Anesthesia Other Findings LM: Normal LAD: Tortuous Diag 2 ostial 80% stenosis Lcx: Very tortuous        Mid focal 70% stenosis. RFR 0.93 (physiologically non-significant) RCA: No significant disease   RA: 4 mmHg RV: 40/7 mmHg PA: 28/12 mmHg, mPAP 18 mmHg PCW: Unable to obtain due to tortuosity   CO: 6.2 L/min CI: 3.0 L/min/m2   Moderate nonobstructive coronary artery disease Normal filling pressures No pulmonary hypertension   Consider non-cardiac cause for exertional dyspnea Low cardiac risk for upcoming knee surgery     Reproductive/Obstetrics                             Anesthesia Physical Anesthesia Plan  ASA: 2  Anesthesia Plan: Spinal   Post-op Pain Management: Regional block* and Minimal or no pain anticipated   Induction:   PONV Risk Score and Plan: 2 and Ondansetron, Midazolam, Treatment may vary due to age or medical  condition and Dexamethasone  Airway Management Planned: Simple Face Mask and Natural Airway  Additional Equipment: None  Intra-op Plan:   Post-operative Plan:   Informed Consent: I have reviewed the patients History and Physical, chart, labs and discussed the procedure including the risks, benefits and alternatives for the proposed anesthesia with the patient or authorized representative who has indicated his/her understanding and acceptance.       Plan Discussed with: CRNA  Anesthesia Plan Comments:         Anesthesia Quick Evaluation

## 2022-11-09 NOTE — Anesthesia Procedure Notes (Signed)
Spinal  Patient location during procedure: OR Start time: 11/09/2022 1:22 PM End time: 11/09/2022 1:26 PM Reason for block: surgical anesthesia Staffing Performed: anesthesiologist  Anesthesiologist: Lyn Hollingshead, MD Performed by: Lyn Hollingshead, MD Authorized by: Lyn Hollingshead, MD   Preanesthetic Checklist Completed: patient identified, IV checked, site marked, risks and benefits discussed, surgical consent, monitors and equipment checked, pre-op evaluation and timeout performed Spinal Block Patient position: sitting Prep: DuraPrep and site prepped and draped Patient monitoring: continuous pulse ox and blood pressure Approach: left paramedian Location: L3-4 Injection technique: single-shot Needle Needle type: Pencan  Needle gauge: 24 G Needle length: 10 cm Needle insertion depth: 5 cm Assessment Sensory level: T10 Events: CSF return

## 2022-11-09 NOTE — Plan of Care (Signed)
  Problem: Education: Goal: Knowledge of the prescribed therapeutic regimen will improve Outcome: Progressing   Problem: Activity: Goal: Ability to avoid complications of mobility impairment will improve Outcome: Progressing   Problem: Pain Management: Goal: Pain level will decrease with appropriate interventions Outcome: Progressing

## 2022-11-09 NOTE — Interval H&P Note (Signed)
History and Physical Interval Note:  11/09/2022 12:11 PM  Kristine [name]  has presented today for surgery, with the diagnosis of Left knee osteoarthritis, Right knee arthrofibriosis.  The various methods of treatment have been discussed with the patient and family. After consideration of risks, benefits and other options for treatment, the patient has consented to  Procedure(s): TOTAL KNEE ARTHROPLASTY (Left) CLOSED MANIPULATION KNEE (Right) as a surgical intervention.  The patient's history has been reviewed, patient examined, no change in status, stable for surgery.  I have reviewed the patient's chart and labs.  Questions were answered to the patient's satisfaction.     Mauri Pole

## 2022-11-09 NOTE — H&P (Signed)
TOTAL KNEE ADMISSION H&P  Patient is being admitted for left total knee arthroplasty.  Subjective:  Chief Complaint:left knee pain.  HPI: Kristine [name], 71 y.o. female, has a history of pain and functional disability in the left knee due to arthritis and has failed non-surgical conservative treatments for greater than 12 weeks to includeNSAID's and/or analgesics, corticosteriod injections, and activity modification.  Onset of symptoms was gradual, starting 2 years ago with gradually worsening course since that time. The patient noted no past surgery on the left knee(s).  Patient currently rates pain in the left knee(s) at 8 out of 10 with activity. Patient has worsening of pain with activity and weight bearing, pain that interferes with activities of daily living, and pain with passive range of motion.  Patient has evidence of joint space narrowing by imaging studies. There is no active infection.  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   Ischemic chest pain ([place]) 07/10/2019   Hyperlipemia 07/24/2018   Simple chronic bronchitis ([place]) 12/08/2017   Upper airway cough syndrome 12/23/2016   Chest pain, atypical 08/21/2014   DOE (dyspnea on exertion) 08/21/2014   Vitamin D deficiency 05/13/2013   Spinal stenosis of lumbar region 05/13/2013   Elevated LDL cholesterol level 35/32/9924   Metabolic syndrome 26/83/4196   Impaired glucose tolerance 04/14/2013   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   Morbid (severe) obesity due to excess calories ([place]) 11/11/2011   Sarcoidosis ([place]) 09/30/2011   Essential hypertension 09/30/2011   Osteoarthritis 09/30/2011   GERD (gastroesophageal reflux disease) 09/30/2011   Asthma 09/30/2011   Goiter 09/30/2011   Past Medical History:  Diagnosis Date   Allergy    Arthritis    Asthma    Back pain    Complication of  anesthesia    slow to wake up   Dyspnea    Glaucoma    ROD   Heart murmur    Hypertension    Joint pain    Knee pain    Obesity    Sarcoidosis    SOB (shortness of breath)     Past Surgical History:  Procedure Laterality Date   ABDOMINAL HYSTERECTOMY     BUNIONECTOMY     left   CHOLECYSTECTOMY     INTRAVASCULAR PRESSURE WIRE/FFR STUDY N/A 10/19/2022   Procedure: INTRAVASCULAR PRESSURE WIRE/FFR STUDY;  Surgeon: Nigel Mormon, MD;  Location: [place] [facility];  Service: Cardiovascular;  Laterality: N/A;   KNEE SURGERY     right   LEFT HEART CATHETERIZATION WITH CORONARY ANGIOGRAM N/A 09/12/2014   Procedure: LEFT HEART CATHETERIZATION WITH CORONARY ANGIOGRAM;  Surgeon: Troy Sine, MD;  Location: [facility] CATH [facility];  Service: Cardiovascular;  Laterality: N/A;   RIGHT/LEFT HEART CATH AND CORONARY ANGIOGRAPHY N/A 10/19/2022   Procedure: RIGHT/LEFT HEART CATH AND CORONARY ANGIOGRAPHY;  Surgeon: Nigel Mormon, MD;  Location: [place] [facility];  Service: Cardiovascular;  Laterality: N/A;   TOTAL KNEE ARTHROPLASTY Right 03/09/2022   Procedure: TOTAL KNEE ARTHROPLASTY;  Surgeon: Paralee Cancel, MD;  Location: [facility];  Service: Orthopedics;  Laterality: Right;    No current facility-administered medications for this encounter.   Current Outpatient Medications  Medication Sig Dispense Refill Last Dose   albuterol (VENTOLIN HFA) 108 (90 Base) MCG/ACT inhaler INHALE TWO PUFFS BY MOUTH EVERY 6 HOURS AS NEEDED FOR WHEEZING OR SHORTNESS OF BREATH 8.5 g 1  aspirin EC 81 MG tablet Take 81 mg by mouth daily.      atorvastatin (LIPITOR) 10 MG tablet Take 10 mg by mouth daily.      Brinzolamide-Brimonidine (SIMBRINZA) 1-0.2 % SUSP Place 1 drop into both eyes daily.      CALCIUM PO Take 600 mg by mouth daily.      Cholecalciferol (VITAMIN D3) 5000 UNITS CAPS Take 5,000 Units by mouth daily.      Magnesium 400 MG CAPS Take 400 mg by mouth daily.      metoprolol succinate (TOPROL-XL) 25 MG  24 hr tablet TAKE ONE TABLET BY MOUTH DAILY 90 tablet 1    Multiple Vitamin (MULTIVITAMIN) capsule Take 1 capsule by mouth daily.      telmisartan (MICARDIS) 80 MG tablet Take 1 tablet (80 mg total) by mouth daily. 90 tablet 1    amoxicillin-clavulanate (AUGMENTIN) 875-125 MG tablet Take 1 tablet by mouth 2 (two) times daily. 14 tablet 0    benzonatate (TESSALON PERLES) 100 MG capsule Take 1 capsule (100 mg total) by mouth every 6 (six) hours as needed. 30 capsule 1    HYDROcodone bit-homatropine (HYDROMET) 5-1.5 MG/5ML syrup Take 5 mLs by mouth every 6 (six) hours as needed for cough. 120 mL 0    isosorbide mononitrate (IMDUR) 30 MG 24 hr tablet Take 1 tablet (30 mg total) by mouth daily. (Patient not taking: Reported on 10/28/2022) 90 tablet 3 Not Taking   rosuvastatin (CRESTOR) 20 MG tablet Take 1 tablet (20 mg total) by mouth daily. 90 tablet 3 Not Taking   Allergies  Allergen Reactions   Crestor [Rosuvastatin Calcium]     Pt believes this may have been causing headaches, not sure if it was this or isosorbide mononitrate   Isosorbide     Pt believes this may have been causing headaches, not sure if it was this or crestor    Social History   Tobacco Use   Smoking status: Never   Smokeless tobacco: Never  Substance Use Topics   Alcohol use: Yes    Comment: occ    Family History  Problem Relation Age of Onset   Breast cancer Mother    Lung cancer Mother    Lung cancer Father        smoked   Heart disease Maternal Grandmother    Hypertension Brother    Liver cancer Brother    Pneumonia Brother      Review of Systems  Constitutional:  Negative for chills and fever.  Respiratory:  Negative for cough and shortness of breath.   Cardiovascular:  Negative for chest pain.  Gastrointestinal:  Negative for nausea and vomiting.  Musculoskeletal:  Positive for arthralgias.     Objective:  Physical Exam Well nourished and well developed. General: Alert and oriented x3,  cooperative and pleasant, no acute distress. Head: normocephalic, atraumatic, neck supple. Eyes: EOMI.  Musculoskeletal: Left knee exam: No palpable effusion, warmth or erythema Slight valgus to the left knee with associated flexion contracture of about 5 degrees Flexion to 110 degrees with terminal tightness and crepitation anterior and laterally  Right knee exam: Her surgical incision remains well-healed She continues to lack extension without palpable defect Her flexion is to about 100 degrees with some terminal tightness and soreness Stable medial and lateral collateral ligaments  Calves soft and nontender. Motor function intact in LE. Strength 5/5 LE bilaterally. Neuro: Distal pulses 2+. Sensation to light touch intact in LE.  Vital signs in last 24  hours:    Labs:   Estimated body mass index is 35.48 kg/m as calculated from the following:   Height as of 11/03/22: 5' 5" (1.651 m).   Weight as of 11/03/22: 96.7 kg.   Imaging Review Plain radiographs demonstrate severe degenerative joint disease of the left knee(s). The overall alignment isneutral. The bone quality appears to be adequate for age and reported activity level.      Assessment/Plan:  End stage arthritis, left knee   The patient history, physical examination, clinical judgment of the provider and imaging studies are consistent with end stage degenerative joint disease of the left knee(s) and total knee arthroplasty is deemed medically necessary. The treatment options including medical management, injection therapy arthroscopy and arthroplasty were discussed at length. The risks and benefits of total knee arthroplasty were presented and reviewed. The risks due to aseptic loosening, infection, stiffness, patella tracking problems, thromboembolic complications and other imponderables were discussed. The patient acknowledged the explanation, agreed to proceed with the plan and consent was signed. Patient is being  admitted for inpatient treatment for surgery, pain control, PT, OT, prophylactic antibiotics, VTE prophylaxis, progressive ambulation and ADL's and discharge planning. The patient is planning to be discharged  home.  Therapy Plans: outpatient therapy at [facility] Disposition: Home with husband & daughter Planned DVT Prophylaxis: aspirin 28m BID DME needed: none PCP: Dr. SBaird Cancer Cardiologist: Dr. GEinar Gip appointment on Monday (had recent heart cath) Pulmonologist: TXA: IV Allergies: NKDA Anesthesia Concerns: none BMI: 34.2 Last HgbA1c: Not diabetic   Other: - Wants diflucan with abx - Staying overnight - Oxycodone, celebrex (daily), tylenol, methocarbamol - Grandson is [employer], retail([employer])    Patient's anticipated LOS is less than 2 midnights, meeting these requirements: - Younger than 616- Lives within 1 hour of care - Has a competent adult at home to recover with post-op recover - NO history of  - Chronic pain requiring opiods  - Diabetes  - Coronary Artery Disease  - Heart failure  - Heart attack  - Stroke  - DVT/VTE  - Cardiac arrhythmia  - Respiratory Failure/COPD  - Renal failure  - Anemia  - Advanced Liver disease  ACostella Hatcher PA-C Orthopedic Surgery [facility] [place] [phone]

## 2022-11-10 DIAGNOSIS — M1712 Unilateral primary osteoarthritis, left knee: Secondary | ICD-10-CM | POA: Diagnosis not present

## 2022-11-10 LAB — BASIC METABOLIC PANEL
Anion gap: 6 (ref 5–15)
BUN: 20 mg/dL (ref 8–23)
CO2: 23 mmol/L (ref 22–32)
Calcium: 8.7 mg/dL — ABNORMAL LOW (ref 8.9–10.3)
Chloride: 111 mmol/L (ref 98–111)
Creatinine, Ser: 0.93 mg/dL (ref 0.44–1.00)
GFR, Estimated: >60 mL/min (ref >60)
Glucose, Bld: 154 mg/dL — ABNORMAL HIGH (ref 70–99)
Potassium: 4.2 mmol/L (ref 3.5–5.1)
Sodium: 140 mmol/L (ref 135–145)

## 2022-11-10 LAB — CBC
HCT: 29.8 % — ABNORMAL LOW (ref 36.0–46.0)
Hemoglobin: 9.6 g/dL — ABNORMAL LOW (ref 12.0–15.0)
MCH: 26.5 pg (ref 26.0–34.0)
MCHC: 32.2 g/dL (ref 30.0–36.0)
MCV: 82.3 fL (ref 80.0–100.0)
Platelets: 191 10*3/uL (ref 150–400)
RBC: 3.62 MIL/uL — ABNORMAL LOW (ref 3.87–5.11)
RDW: 13.9 % (ref 11.5–15.5)
WBC: 10.5 10*3/uL (ref 4.0–10.5)
nRBC: 0 % (ref 0.0–0.2)

## 2022-11-10 MED ORDER — METHOCARBAMOL 500 MG PO TABS: 500.0000 mg | ORAL_TABLET | Freq: Four times a day (QID) | ORAL | 2 refills | Status: DC | PRN

## 2022-11-10 MED ORDER — CELECOXIB 200 MG PO CAPS: 200.0000 mg | ORAL_CAPSULE | Freq: Every day | ORAL | 0 refills | Status: DC

## 2022-11-10 MED ORDER — TRANEXAMIC ACID 650 MG PO TABS
1950.0000 mg | ORAL_TABLET | Freq: Every day | ORAL | Status: AC
  Administered 2022-11-10: 1950 mg via ORAL
  Filled 2022-11-10: qty 3

## 2022-11-10 MED ORDER — OXYCODONE HCL 5 MG PO TABS: 5.0000 mg | ORAL_TABLET | ORAL | 0 refills | Status: DC | PRN

## 2022-11-10 MED ORDER — ACETAMINOPHEN 500 MG PO TABS: 1000.0000 mg | ORAL_TABLET | Freq: Four times a day (QID) | ORAL | 0 refills | Status: DC

## 2022-11-10 MED ORDER — POLYETHYLENE GLYCOL 3350 17 G PO PACK: 17.0000 g | PACK | Freq: Two times a day (BID) | ORAL | 0 refills | Status: DC

## 2022-11-10 MED ORDER — ASPIRIN 81 MG PO CHEW: 81.0000 mg | CHEWABLE_TABLET | Freq: Two times a day (BID) | ORAL | 0 refills | Status: AC

## 2022-11-10 MED ORDER — SENNA 8.6 MG PO TABS: 1.0000 | ORAL_TABLET | Freq: Every day | ORAL | 0 refills | Status: AC

## 2022-11-10 NOTE — Plan of Care (Signed)

## 2022-11-10 NOTE — Progress Notes (Signed)
Provided discharge education/instructions, all questions and concerns addressed. Pt not in acute distress, discharged home with belongings accompanied by her daughter.

## 2022-11-10 NOTE — TOC Transition Note (Signed)
Transition of Care [facility]) - CM/SW Discharge Note  Patient Details  Name: Kristine [name] MRN: [id number] Date of Birth: 1951/11/11  [facility]) CM/SW Contact:  Sherie Don, LCSW Phone Number: 11/10/2022, 9:51 AM  Clinical Narrative: Patient is expected to discharge home after working with PT. CSW met with patient and her daughter, Kristine [name], to review discharge plan. Patient will go home with OPPT at [facility]. Patient has a rolling walker, cane, and shower chair at home so there are no DME needs at this time. TOC signing off.    Final next level of care: OP Rehab Barriers to Discharge: No Barriers Identified  Patient Goals and CMS Choice Choice offered to / list presented to : NA  Discharge Plan and Services Additional resources added to the After Visit Summary for: N/A    DME Arranged: N/A DME Agency: NA  Social Determinants of Health (SDOH) Interventions SDOH Screenings   Food Insecurity: No Food Insecurity (11/09/2022)  Housing: Low Risk  (11/09/2022)  Transportation Needs: No Transportation Needs (11/09/2022)  Utilities: Not At Risk (11/09/2022)  Depression (PHQ2-9): Low Risk  (11/02/2022)  Tobacco Use: Low Risk  (11/09/2022)   Readmission Risk Interventions     No data to display

## 2022-11-10 NOTE — Progress Notes (Signed)
Physical Therapy Treatment Patient Details Name: Kristine [name] MRN: [id number] DOB: 06/15/1951 Today's Date: 11/10/2022   History of Present Illness Pt is a 70yo female presenting s/p R closed manipulation and L-TKA on 11/09/22. PMH: OA, HTN, sarcoidosis, R-TKA 03/09/22, CAD.    PT Comments    Pt seen POD1 received supine in bed. Demonstrated modified independence for bed mobility, min guard for transfers, and min guard for ambulation in hallway with RW 120ft. Pt also ambulated from bed to toilet and was able to void. Pt completed stair training with min assist and cuing for sequencing, handout provided. Provided HEP and pt completed exercises with safe form and minimal cuing. All education completed and pt has no further questions. Pt has met mobility goals for safe discharge home, PT is signing off, should needs change please reconsult. Thank you for this referral.    Recommendations for follow up therapy are one component of a multi-disciplinary discharge planning process, led by the attending physician.  Recommendations may be updated based on patient status, additional functional criteria and insurance authorization.  Follow Up Recommendations  Follow physician's recommendations for discharge plan and follow up therapies     Assistance Recommended at Discharge Frequent or constant Supervision/Assistance  Patient can return home with the following A little help with walking and/or transfers;A little help with bathing/dressing/bathroom;Assistance with cooking/housework;Assist for transportation;Help with stairs or ramp for entrance   Equipment Recommendations  None recommended by PT    Recommendations for Other Services       Precautions / Restrictions Precautions Precautions: Fall;Knee Precaution Booklet Issued: No Precaution Comments: no pillow under the knee Restrictions Weight Bearing Restrictions: Yes RLE Weight Bearing: Weight bearing as tolerated LLE Weight Bearing:  Weight bearing as tolerated Other Position/Activity Restrictions: Confirmed WBAT BLE     Mobility  Bed Mobility Overal bed mobility: Needs Assistance Bed Mobility: Supine to Sit, Sit to Supine     Supine to sit: HOB elevated, Modified independent (Device/Increase time)     General bed mobility comments: Increased time    Transfers Overall transfer level: Needs assistance Equipment used: Rolling walker (2 wheels) Transfers: Sit to/from Stand Sit to Stand: Min guard           General transfer comment: For safety only, VCs for sequencing and scooting hips EOB prior to attempting transfer    Ambulation/Gait Ambulation/Gait assistance: Min guard Gait Distance (Feet): 120 Feet Assistive device: Rolling walker (2 wheels) Gait Pattern/deviations: Step-to pattern Gait velocity: decreased     General Gait Details: Pt ambulated with RW and min guard, no physical assist required or overt LOB noted. VCs for sequencing   Stairs Stairs: Yes Stairs assistance: Min assist Stair Management: No rails, Step to pattern, Backwards, With walker Number of Stairs: 4 General stair comments: Provided handout, all questions answered. Pt completed stair mobility with min assist for steadying and VCs for sequencing. No overt LOB noted.   Wheelchair Mobility    Modified Rankin (Stroke Patients Only)       Balance Overall balance assessment: Needs assistance Sitting-balance support: Feet supported, No upper extremity supported Sitting balance-Leahy Scale: Good         Standing balance comment: deferred                            Cognition Arousal/Alertness: Awake/alert Behavior During Therapy: WFL for tasks assessed/performed Overall Cognitive Status: Within Functional Limits for tasks assessed                                            Exercises Total Joint Exercises Ankle Circles/Pumps: AROM, 20 reps, Both Quad Sets: AROM, Both, 10 reps Short  Arc Quad: AROM, Left, 10 reps Heel Slides: AROM, Left, 10 reps Hip ABduction/ADduction: AROM, Left, 10 reps Straight Leg Raises: AROM, Left, 10 reps Goniometric ROM: -5-45deg by gross visual approximation    General Comments        Pertinent Vitals/Pain Pain Assessment Pain Assessment: 0-10 Pain Score: 6  Pain Location: left knee Pain Descriptors / Indicators: Operative site guarding Pain Intervention(s): Limited activity within patient's tolerance, Monitored during session, Repositioned    Home Living                          Prior Function            PT Goals (current goals can now be found in the care plan section) Acute Rehab PT Goals Patient Stated Goal: Clean my house PT Goal Formulation: With patient Time For Goal Achievement: 11/16/22 Potential to Achieve Goals: Good Progress towards PT goals: Goals met/education completed, patient discharged from PT    Frequency    7X/week      PT Plan Current plan remains appropriate    Co-evaluation              AM-PAC PT "6 Clicks" Mobility   Outcome Measure  Help needed turning from your back to your side while in a flat bed without using bedrails?: None Help needed moving from lying on your back to sitting on the side of a flat bed without using bedrails?: A Little Help needed moving to and from a bed to a chair (including a wheelchair)?: A Little Help needed standing up from a chair using your arms (e.g., wheelchair or bedside chair)?: A Little Help needed to walk in hospital room?: A Little Help needed climbing 3-5 steps with a railing? : A Little 6 Click Score: 19    End of Session Equipment Utilized During Treatment: Gait belt Activity Tolerance: Patient tolerated treatment well;No increased pain Patient left: in bed;with call bell/phone within reach;with bed alarm set;with family/visitor present;with SCD's reapplied Nurse Communication: Mobility status PT Visit Diagnosis: Pain;Difficulty  in walking, not elsewhere classified (R26.2) Pain - Right/Left: Left Pain - part of body: Knee     Time: 1045-1109 PT Time Calculation (min) (ACUTE ONLY): 24 min  Charges:  $Gait Training: 8-22 mins $Therapeutic Exercise: 8-22 mins                      , PT, DPT [facility] Department Office: [phone] Weekend pager: [phone]     11/10/2022, 11:23 AM  

## 2022-11-10 NOTE — Progress Notes (Signed)
Subjective: 1 Day Post-Op Procedure(s) (LRB): TOTAL KNEE ARTHROPLASTY (Left) CLOSED MANIPULATION KNEE (Right) Patient reports pain as mild.   Patient seen in rounds with Dr. Alvan Dame. Patient is well, and has had no acute complaints or problems. No acute events overnight. Foley catheter removed. Patient has not been up with PT yet.  We will start therapy today.   Objective: Vital signs in last 24 hours: Temp:  [97.4 F (36.3 C)-97.9 F (36.6 C)] 97.8 F (36.6 C) (12/27 0444) Pulse Rate:  [65-78] 76 (12/27 0444) Resp:  [11-16] 16 (12/27 0444) BP: (106-150)/(66-97) 112/66 (12/27 0444) SpO2:  [90 %-99 %] 95 % (12/27 0444) Weight:  [96.7 kg] 96.7 kg (12/26 1822)  Intake/Output from previous day:  Intake/Output Summary (Last 24 hours) at 11/10/2022 0801 Last data filed at 11/10/2022 0641 Gross per 24 hour  Intake 2058.81 ml  Output 1475 ml  Net 583.81 ml     Intake/Output this shift: No intake/output data recorded.  Labs: Recent Labs    11/10/22 0438  HGB 9.6*   Recent Labs    11/10/22 0438  WBC 10.5  RBC 3.62*  HCT 29.8*  PLT 191   Recent Labs    11/10/22 0438  NA 140  K 4.2  CL 111  CO2 23  BUN 20  CREATININE 0.93  GLUCOSE 154*  CALCIUM 8.7*   No results for input(s): "LABPT", "INR" in the last 72 hours.  Exam: General - Patient is Alert and Oriented Extremity - Neurologically intact Sensation intact distally Intact pulses distally Dorsiflexion/Plantar flexion intact Dressing - dressing C/D/I Motor Function - intact, moving foot and toes well on exam.   Past Medical History:  Diagnosis Date   Allergy    Arthritis    Asthma    Back pain    Complication of anesthesia    slow to wake up   Dyspnea    Glaucoma    ROD   Heart murmur    Hypertension    Joint pain    Knee pain    Obesity    Sarcoidosis    SOB (shortness of breath)     Assessment/Plan: 1 Day Post-Op Procedure(s) (LRB): TOTAL KNEE ARTHROPLASTY (Left) CLOSED MANIPULATION  KNEE (Right) Principal Problem:   S/P total knee arthroplasty, left  Estimated body mass index is 35.48 kg/m as calculated from the following:   Height as of this encounter: 5' 5" (1.651 m).   Weight as of this encounter: 96.7 kg. Advance diet Up with therapy D/C IV fluids   Patient's anticipated LOS is less than 2 midnights, meeting these requirements: - Younger than 55 - Lives within 1 hour of care - Has a competent adult at home to recover with post-op recover - NO history of  - Chronic pain requiring opiods  - Diabetes  - Coronary Artery Disease  - Heart failure  - Heart attack  - Stroke  - DVT/VTE  - Cardiac arrhythmia  - Respiratory Failure/COPD  - Renal failure  - Anemia  - Advanced Liver disease     DVT Prophylaxis - Aspirin Weight bearing as tolerated.  Hgb stable at 9.6 this AM.  Plan is to go Home after hospital stay. Plan for discharge today following 1-2 sessions of PT as long as they are meeting their goals. Patient is scheduled for OPPT. Follow up in the office in 2 weeks.   Griffith Citron, PA-C Orthopedic Surgery [phone] 11/10/2022, 8:01 AM

## 2022-11-12 ENCOUNTER — Encounter (HOSPITAL_COMMUNITY): Payer: Self-pay | Admitting: Orthopedic Surgery

## 2022-11-14 ENCOUNTER — Encounter: Payer: Self-pay | Admitting: Nurse Practitioner

## 2022-11-14 DIAGNOSIS — I700 Atherosclerosis of aorta: Secondary | ICD-10-CM | POA: Insufficient documentation

## 2022-11-18 ENCOUNTER — Other Ambulatory Visit: Payer: Self-pay

## 2022-11-21 NOTE — Discharge Summary (Signed)
Patient ID: Kristine [name] MRN: [id number] DOB/AGE: July 15, 1951 72 y.o.  Admit date: 11/09/2022 Discharge date: 11/10/2022  Admission Diagnoses:  Left knee osteoarthritis Arthrofibrosis, right total knee  Discharge Diagnoses:  Principal Problem:   S/P total knee arthroplasty, left   Past Medical History:  Diagnosis Date   Allergy    Arthritis    Asthma    Back pain    Complication of anesthesia    slow to wake up   Dyspnea    Glaucoma    ROD   Heart murmur    Hypertension    Joint pain    Knee pain    Obesity    Sarcoidosis    SOB (shortness of breath)     Surgeries: Procedure(s): TOTAL KNEE ARTHROPLASTY CLOSED MANIPULATION KNEE on 11/09/2022   Consultants:   Discharged Condition: Improved  Hospital Course: Kristine [name] is an 72 y.o. female who was admitted 11/09/2022 for operative treatment ofS/P total knee arthroplasty, left. Patient has severe unremitting pain that affects sleep, daily activities, and work/hobbies. After pre-op clearance the patient was taken to the operating room on 11/09/2022 and underwent  Procedure(s): TOTAL KNEE ARTHROPLASTY CLOSED MANIPULATION KNEE.    Patient was given perioperative antibiotics:  Anti-infectives (From admission, onward)    Start     Dose/Rate Route Frequency Ordered Stop   11/09/22 2000  ceFAZolin (ANCEF) IVPB 2g/100 mL premix        2 g 200 mL/hr over 30 Minutes Intravenous Every 6 hours 11/09/22 1802 11/10/22 1146   11/09/22 1030  ceFAZolin (ANCEF) IVPB 2g/100 mL premix        2 g 200 mL/hr over 30 Minutes Intravenous On call to O.R. 11/09/22 1027 11/09/22 1324        Patient was given sequential compression devices, early ambulation, and chemoprophylaxis to prevent DVT. Patient worked with PT and was meeting their goals regarding safe ambulation and transfers.  Patient benefited maximally from hospital stay and there were no complications.    Recent vital signs: No data found.   Recent laboratory  studies: No results for input(s): "WBC", "HGB", "HCT", "PLT", "NA", "K", "CL", "CO2", "BUN", "CREATININE", "GLUCOSE", "INR", "CALCIUM" in the last 72 hours.  Invalid input(s): "PT", "2"   Discharge Medications:   Allergies as of 11/10/2022       Reactions   Crestor [rosuvastatin Calcium] Other (See Comments)   Pt believes this may have been causing headaches, not sure if it was this or isosorbide mononitrate   Isosorbide    Pt believes this may have been causing headaches, not sure if it was this or crestor        Medication List     STOP taking these medications    amoxicillin-clavulanate 875-125 MG tablet Commonly known as: AUGMENTIN   aspirin EC 81 MG tablet Replaced by: aspirin 81 MG chewable tablet   HYDROcodone bit-homatropine 5-1.5 MG/5ML syrup Commonly known as: Hydromet   isosorbide mononitrate 30 MG 24 hr tablet Commonly known as: IMDUR       TAKE these medications    acetaminophen 500 MG tablet Commonly known as: TYLENOL Take 2 tablets (1,000 mg total) by mouth every 6 (six) hours. Notes to patient: Last dose given 12/27 06:35am   albuterol 108 (90 Base) MCG/ACT inhaler Commonly known as: VENTOLIN HFA INHALE TWO PUFFS BY MOUTH EVERY 6 HOURS AS NEEDED FOR WHEEZING OR SHORTNESS OF BREATH Notes to patient: Resume home regimen   aspirin 81 MG chewable tablet Chew 1  tablet (81 mg total) by mouth 2 (two) times daily for 28 days. Replaces: aspirin EC 81 MG tablet   atorvastatin 10 MG tablet Commonly known as: LIPITOR Take 10 mg by mouth daily.   benzonatate 100 MG capsule Commonly known as: Tessalon Perles Take 1 capsule (100 mg total) by mouth every 6 (six) hours as needed. Notes to patient: Resume home regimen   CALCIUM PO Take 600 mg by mouth daily. Notes to patient: Resume home regimen   celecoxib 200 MG capsule Commonly known as: CELEBREX Take 1 capsule (200 mg total) by mouth daily.   Magnesium 400 MG Caps Take 400 mg by mouth  daily. Notes to patient: Resume home regimen   methocarbamol 500 MG tablet Commonly known as: ROBAXIN Take 1 tablet (500 mg total) by mouth every 6 (six) hours as needed for muscle spasms. Notes to patient: Last dose given 12/26 09:29pm   metoprolol succinate 25 MG 24 hr tablet Commonly known as: TOPROL-XL TAKE ONE TABLET BY MOUTH DAILY   multivitamin capsule Take 1 capsule by mouth daily. Notes to patient: Resume home regimen   oxyCODONE 5 MG immediate release tablet Commonly known as: Oxy IR/ROXICODONE Take 1 tablet (5 mg total) by mouth every 4 (four) hours as needed for severe pain. Notes to patient: Last dose given 12/27 02:24am   polyethylene glycol 17 g packet Commonly known as: MIRALAX / GLYCOLAX Take 17 g by mouth 2 (two) times daily.   rosuvastatin 20 MG tablet Commonly known as: CRESTOR Take 1 tablet (20 mg total) by mouth daily. Notes to patient: Resume home regimen   senna 8.6 MG Tabs tablet Commonly known as: SENOKOT Take 1 tablet (8.6 mg total) by mouth at bedtime for 14 days.   Simbrinza 1-0.2 % Susp Generic drug: Brinzolamide-Brimonidine Place 1 drop into both eyes daily. Notes to patient: Resume home regimen   telmisartan 80 MG tablet Commonly known as: MICARDIS Take 1 tablet (80 mg total) by mouth daily.   Vitamin D3 125 MCG (5000 UT) Caps Take 5,000 Units by mouth daily. Notes to patient: Resume home regimen               Discharge Care Instructions  (From admission, onward)           Start     Ordered   11/10/22 0000  Change dressing       Comments: Maintain surgical dressing until follow up in the clinic. If the edges start to pull up, may reinforce with tape. If the dressing is no longer working, may remove and cover with gauze and tape, but must keep the area dry and clean.  Call with any questions or concerns.   11/10/22 0805            Diagnostic Studies: No results found.  Disposition: Discharge disposition: 01-Home  or Self Care       Discharge Instructions     Call MD / Call 911   Complete by: As directed    If you experience chest pain or shortness of breath, CALL 911 and be transported to the hospital emergency room.  If you develope a fever above 101 F, pus (white drainage) or increased drainage or redness at the wound, or calf pain, call your surgeon's office.   Change dressing   Complete by: As directed    Maintain surgical dressing until follow up in the clinic. If the edges start to pull up, may reinforce with tape. If the dressing is  no longer working, may remove and cover with gauze and tape, but must keep the area dry and clean.  Call with any questions or concerns.   Constipation Prevention   Complete by: As directed    Drink plenty of fluids.  Prune juice may be helpful.  You may use a stool softener, such as Colace (over the counter) 100 mg twice a day.  Use MiraLax (over the counter) for constipation as needed.   Diet - low sodium heart healthy   Complete by: As directed    Increase activity slowly as tolerated   Complete by: As directed    Weight bearing as tolerated with assist device (walker, cane, etc) as directed, use it as long as suggested by your surgeon or therapist, typically at least 4-6 weeks.   Post-operative opioid taper instructions:   Complete by: As directed    POST-OPERATIVE OPIOID TAPER INSTRUCTIONS: It is important to wean off of your opioid medication as soon as possible. If you do not need pain medication after your surgery it is ok to stop day one. Opioids include: Codeine, Hydrocodone(Norco, Vicodin), Oxycodone(Percocet, oxycontin) and hydromorphone amongst others.  Long term and even short term use of opiods can cause: Increased pain response Dependence Constipation Depression Respiratory depression And more.  Withdrawal symptoms can include Flu like symptoms Nausea, vomiting And more Techniques to manage these symptoms Hydrate well Eat regular  healthy meals Stay active Use relaxation techniques(deep breathing, meditating, yoga) Do Not substitute Alcohol to help with tapering If you have been on opioids for less than two weeks and do not have pain than it is ok to stop all together.  Plan to wean off of opioids This plan should start within one week post op of your joint replacement. Maintain the same interval or time between taking each dose and first decrease the dose.  Cut the total daily intake of opioids by one tablet each day Next start to increase the time between doses. The last dose that should be eliminated is the evening dose.      TED hose   Complete by: As directed    Use stockings (TED hose) for 2 weeks on both leg(s).  You may remove them at night for sleeping.        Follow-up Information     Paralee Cancel, MD. Schedule an appointment as soon as possible for a visit in 2 week(s).   Specialty: Orthopedic Surgery Contact information: [address] [place] [address] [phone]                  Signed: Irving Copas 11/21/2022, 7:46 AM

## 2022-11-22 ENCOUNTER — Encounter: Payer: BC Managed Care – PPO | Admitting: Internal Medicine

## 2022-11-25 ENCOUNTER — Other Ambulatory Visit: Payer: Self-pay

## 2022-11-25 MED ORDER — ATORVASTATIN CALCIUM 10 MG PO TABS: 10.0000 mg | ORAL_TABLET | Freq: Every day | ORAL | 3 refills | Status: DC

## 2023-01-03 ENCOUNTER — Other Ambulatory Visit: Payer: Self-pay | Admitting: Internal Medicine

## 2023-01-03 DIAGNOSIS — Z1231 Encounter for screening mammogram for malignant neoplasm of breast: Secondary | ICD-10-CM

## 2023-01-05 ENCOUNTER — Ambulatory Visit
Admission: RE | Admit: 2023-01-05 | Discharge: 2023-01-05 | Disposition: A | Discharge status: Home / Self Care | Payer: BC Managed Care – PPO | Source: Ambulatory Visit | Attending: Internal Medicine | Admitting: Internal Medicine

## 2023-01-05 DIAGNOSIS — Z1231 Encounter for screening mammogram for malignant neoplasm of breast: Secondary | ICD-10-CM

## 2023-03-01 ENCOUNTER — Other Ambulatory Visit: Payer: Medicare Other

## 2023-03-01 DIAGNOSIS — E782 Mixed hyperlipidemia: Secondary | ICD-10-CM

## 2023-03-01 DIAGNOSIS — I10 Essential (primary) hypertension: Secondary | ICD-10-CM

## 2023-03-01 DIAGNOSIS — R7309 Other abnormal glucose: Secondary | ICD-10-CM

## 2023-03-01 DIAGNOSIS — E559 Vitamin D deficiency, unspecified: Secondary | ICD-10-CM

## 2023-03-01 DIAGNOSIS — Z0000 Encounter for general adult medical examination without abnormal findings: Secondary | ICD-10-CM

## 2023-03-02 ENCOUNTER — Encounter: Payer: BC Managed Care – PPO | Admitting: Internal Medicine

## 2023-03-02 LAB — CMP14+EGFR
ALT: 10 IU/L (ref 0–32)
AST: 14 IU/L (ref 0–40)
Albumin/Globulin Ratio: 1.5 (ref 1.2–2.2)
Albumin: 4 g/dL (ref 3.9–4.9)
Alkaline Phosphatase: 71 IU/L (ref 44–121)
BUN/Creatinine Ratio: 16 (ref 12–28)
BUN: 17 mg/dL (ref 8–27)
Bilirubin Total: 0.3 mg/dL (ref 0.0–1.2)
CO2: 21 mmol/L (ref 20–29)
Calcium: 9.5 mg/dL (ref 8.7–10.3)
Chloride: 106 mmol/L (ref 96–106)
Creatinine, Ser: 1.09 mg/dL — ABNORMAL HIGH (ref 0.57–1.00)
Globulin, Total: 2.7 g/dL (ref 1.5–4.5)
Glucose: 98 mg/dL (ref 70–99)
Potassium: 4.3 mmol/L (ref 3.5–5.2)
Sodium: 144 mmol/L (ref 134–144)
Total Protein: 6.7 g/dL (ref 6.0–8.5)
eGFR: 55 mL/min/{1.73_m2} — ABNORMAL LOW (ref >59)

## 2023-03-02 LAB — CBC
Hematocrit: 35.4 % (ref 34.0–46.6)
Hemoglobin: 11.4 g/dL (ref 11.1–15.9)
MCH: 26.6 pg (ref 26.6–33.0)
MCHC: 32.2 g/dL (ref 31.5–35.7)
MCV: 83 fL (ref 79–97)
Platelets: 232 10*3/uL (ref 150–450)
RBC: 4.29 x10E6/uL (ref 3.77–5.28)
RDW: 13.8 % (ref 11.7–15.4)
WBC: 5.6 10*3/uL (ref 3.4–10.8)

## 2023-03-02 LAB — LIPID PANEL
Chol/HDL Ratio: 2.3 ratio (ref 0.0–4.4)
Cholesterol, Total: 134 mg/dL (ref 100–199)
HDL: 59 mg/dL (ref >39)
LDL Chol Calc (NIH): 62 mg/dL (ref 0–99)
Triglycerides: 61 mg/dL (ref 0–149)
VLDL Cholesterol Cal: 13 mg/dL (ref 5–40)

## 2023-03-02 LAB — HEMOGLOBIN A1C
Est. average glucose Bld gHb Est-mCnc: 117 mg/dL
Hgb A1c MFr Bld: 5.7 % — ABNORMAL HIGH (ref 4.8–5.6)

## 2023-03-02 LAB — VITAMIN D 25 HYDROXY (VIT D DEFICIENCY, FRACTURES): Vit D, 25-Hydroxy: 61.9 ng/mL (ref 30.0–100.0)

## 2023-03-14 ENCOUNTER — Other Ambulatory Visit: Payer: Self-pay | Admitting: Internal Medicine

## 2023-03-17 ENCOUNTER — Encounter: Payer: Self-pay | Admitting: Family Medicine

## 2023-03-17 ENCOUNTER — Ambulatory Visit (INDEPENDENT_AMBULATORY_CARE_PROVIDER_SITE_OTHER): Payer: BC Managed Care – PPO | Admitting: Family Medicine

## 2023-03-17 VITALS — BP 110/74 | HR 85 | Temp 98.4°F | Ht 65.0 in | Wt 211.0 lb

## 2023-03-17 DIAGNOSIS — I119 Hypertensive heart disease without heart failure: Secondary | ICD-10-CM | POA: Diagnosis not present

## 2023-03-17 DIAGNOSIS — R7309 Other abnormal glucose: Secondary | ICD-10-CM | POA: Diagnosis not present

## 2023-03-17 DIAGNOSIS — Z23 Encounter for immunization: Secondary | ICD-10-CM | POA: Diagnosis not present

## 2023-03-17 DIAGNOSIS — Z6835 Body mass index (BMI) 35.0-35.9, adult: Secondary | ICD-10-CM

## 2023-03-17 DIAGNOSIS — E6609 Other obesity due to excess calories: Secondary | ICD-10-CM

## 2023-03-17 DIAGNOSIS — I700 Atherosclerosis of aorta: Secondary | ICD-10-CM

## 2023-03-17 DIAGNOSIS — Z0000 Encounter for general adult medical examination without abnormal findings: Secondary | ICD-10-CM

## 2023-03-17 DIAGNOSIS — I25118 Atherosclerotic heart disease of native coronary artery with other forms of angina pectoris: Secondary | ICD-10-CM

## 2023-03-17 DIAGNOSIS — E782 Mixed hyperlipidemia: Secondary | ICD-10-CM | POA: Diagnosis not present

## 2023-03-17 NOTE — Patient Instructions (Signed)

## 2023-03-17 NOTE — Progress Notes (Signed)
I,Jameka J Llittleton,acting as a Neurosurgeon for [employer], NP.,have documented all relevant documentation on the behalf of Hurley Sobel, NP,as directed by  Shaya Reddick Moshe Salisbury, NP while in the presence of Janila Arrazola, NP.   Subjective:     Patient ID: Kristine [name] , female    DOB: 05-23-1951 , 72 y.o.   MRN: [id number]   Chief Complaint  Patient presents with   Annual Exam    HPI  She is here today for a full physical examination. She is followed by GYN, Dr. Normand Sloop for her pelvic exams.   Patient stated she has been having some back pain, she said she will go back to [facility] for evaluation.Patient  reports that she still gets tired and short of breath easily, will be going to  her Pulmonologist and Cardiologist for follow up and evaluation.  Wt Readings from Last 3 Encounters: 03/17/23 : 211 lb (95.7 kg) 11/09/22 : 213 lb 3.2 oz (96.7 kg) 11/03/22 : 213 lb 3.2 oz (96.7 kg)    Hypertension This is a chronic problem. The current episode started more than 1 year ago. The problem has been gradually improving since onset. The problem is controlled. Pertinent negatives include no blurred vision, chest pain, headaches, palpitations or shortness of breath. Risk factors for coronary artery disease include sedentary lifestyle, obesity and post-menopausal state. The current treatment provides moderate improvement.     Past Medical History:  Diagnosis Date   Allergy    Arthritis    Asthma    Back pain    Complication of anesthesia    slow to wake up   Dyspnea    Glaucoma    ROD   Heart murmur    Hypertension    Joint pain    Knee pain    Obesity    Sarcoidosis    SOB (shortness of breath)      Family History  Problem Relation Age of Onset   Breast cancer Mother    Lung cancer Mother    Lung cancer Father        smoked   Heart disease Maternal Grandmother    Hypertension Brother    Liver cancer Brother    Pneumonia Brother      Current Outpatient Medications:     albuterol (VENTOLIN HFA) 108 (90 Base) MCG/ACT inhaler, INHALE TWO PUFFS BY MOUTH EVERY 6 HOURS AS NEEDED FOR WHEEZING OR SHORTNESS OF BREATH, Disp: 8.5 g, Rfl: 1   atorvastatin (LIPITOR) 10 MG tablet, Take 1 tablet (10 mg total) by mouth daily., Disp: 90 tablet, Rfl: 3   Brinzolamide-Brimonidine (SIMBRINZA) 1-0.2 % SUSP, Place 1 drop into both eyes daily., Disp: , Rfl:    CALCIUM PO, Take 600 mg by mouth daily., Disp: , Rfl:    Cholecalciferol (VITAMIN D3) 5000 UNITS CAPS, Take 5,000 Units by mouth daily., Disp: , Rfl:    Magnesium 400 MG CAPS, Take 400 mg by mouth daily., Disp: , Rfl:    metoprolol succinate (TOPROL-XL) 25 MG 24 hr tablet, TAKE 1 TABLET BY MOUTH DAILY, Disp: 90 tablet, Rfl: 1   Multiple Vitamin (MULTIVITAMIN) capsule, Take 1 capsule by mouth daily., Disp: , Rfl:    telmisartan (MICARDIS) 80 MG tablet, Take 1 tablet (80 mg total) by mouth daily., Disp: 90 tablet, Rfl: 1   Allergies  Allergen Reactions   Crestor [Rosuvastatin Calcium] Other (See Comments)    Pt believes this may have been causing headaches, not sure if it was this or isosorbide mononitrate  Isosorbide     Pt believes this may have been causing headaches, not sure if it was this or crestor       Social History   Tobacco Use  Smoking Status Never  Smokeless Tobacco Never  . She has been exposed to passive smoke. The patient's alcohol use is:  Social History   Substance and Sexual Activity  Alcohol Use Yes   Comment: occ  .    Review of Systems  Constitutional: Negative.   HENT: Negative.    Eyes: Negative.  Negative for blurred vision.  Respiratory: Negative.  Negative for shortness of breath.   Cardiovascular: Negative.  Negative for chest pain and palpitations.  Gastrointestinal: Negative.   Endocrine: Negative.   Genitourinary: Negative.   Musculoskeletal:  Positive for back pain.  Skin: Negative.   Allergic/Immunologic: Negative.   Neurological: Negative.  Negative for headaches.   Hematological: Negative.   Psychiatric/Behavioral: Negative.       Today's Vitals   03/17/23 1108  BP: 110/74  Pulse: 85  Temp: 98.4 F (36.9 C)  Weight: 211 lb (95.7 kg)  Height: 5\' 5"  (1.651 m)  PainSc: 0-No pain   Body mass index is 35.11 kg/m.   Objective:  Physical Exam HENT:     Head: Normocephalic.  Cardiovascular:     Pulses: Normal pulses.  Musculoskeletal:        General: No swelling or signs of injury.  Neurological:     Mental Status: She is alert.  Psychiatric:        Mood and Affect: Mood normal.         Assessment And Plan:     1. Encounter for general adult medical examination w/o abnormal findings  2. Hypertensive heart disease without heart failure - POCT Urinalysis Dipstick (81002) - Microalbumin / Creatinine Urine Ratio - EKG 12-Lead  3. Other abnormal glucose  4. Mixed hyperlipidemia  5. Immunization due - Pneumococcal conjugate vaccine 20-valent (Prevnar-20)  6. Class 2 obesity due to excess calories with body mass index (BMI) of 35.0 to 35.9 in adult, unspecified whether serious comorbidity present     Patient was given opportunity to ask questions. Patient verbalized understanding of the plan and was able to repeat key elements of the plan. All questions were answered to their satisfaction.   Xzaiver Vayda Moshe Salisbury, NP   I, Latica Hohmann Moshe Salisbury, NP, have reviewed all documentation for this visit. The documentation on 03/22/23 for the exam, diagnosis, procedures, and orders are all accurate and complete.   THE PATIENT IS ENCOURAGED TO PRACTICE SOCIAL DISTANCING DUE TO THE COVID-19 PANDEMIC.

## 2023-03-18 LAB — MICROALBUMIN / CREATININE URINE RATIO
Creatinine, Urine: 205.6 mg/dL
Microalb/Creat Ratio: 4 mg/g creat (ref 0–29)
Microalbumin, Urine: 8.5 ug/mL

## 2023-03-18 LAB — POCT URINALYSIS DIPSTICK
Bilirubin, UA: NEGATIVE
Blood, UA: NEGATIVE
Glucose, UA: NEGATIVE
Ketones, UA: NEGATIVE
Leukocytes, UA: NEGATIVE
Nitrite, UA: NEGATIVE
Protein, UA: NEGATIVE
Spec Grav, UA: 1.025 (ref 1.010–1.025)
Urobilinogen, UA: 0.2 E.U./dL
pH, UA: 6.5 (ref 5.0–8.0)

## 2023-03-23 DIAGNOSIS — I119 Hypertensive heart disease without heart failure: Secondary | ICD-10-CM | POA: Insufficient documentation

## 2023-03-28 ENCOUNTER — Other Ambulatory Visit: Payer: Self-pay | Admitting: Internal Medicine

## 2023-05-05 ENCOUNTER — Ambulatory Visit: Payer: BC Managed Care – PPO | Admitting: Cardiology

## 2023-05-20 ENCOUNTER — Ambulatory Visit: Payer: BC Managed Care – PPO | Admitting: Cardiology

## 2023-05-20 ENCOUNTER — Encounter: Payer: Self-pay | Admitting: Cardiology

## 2023-05-20 VITALS — BP 138/82 | HR 77 | Ht 65.0 in | Wt 212.0 lb

## 2023-05-20 DIAGNOSIS — I10 Essential (primary) hypertension: Secondary | ICD-10-CM

## 2023-05-20 DIAGNOSIS — I447 Left bundle-branch block, unspecified: Secondary | ICD-10-CM | POA: Insufficient documentation

## 2023-05-20 DIAGNOSIS — I2510 Atherosclerotic heart disease of native coronary artery without angina pectoris: Secondary | ICD-10-CM

## 2023-05-20 NOTE — Progress Notes (Signed)
Follow up visit  Subjective:   Kristine [name], female    DOB: 12-21-1950, 72 y.o.   MRN: [id number]   HPI  Chief Complaint  Patient presents with   Hyperlipidemia   Follow-up    72 y.o. Caucasian female with hypertension, nonobstructive CAD  Patient is doing well. She underwent knee surgery recently without any perioperative issues. She is walking more, denies chest pain, shortness of breath, palpitations, leg edema, orthopnea, PND, TIA/syncope.    Current Outpatient Medications:    albuterol (VENTOLIN HFA) 108 (90 Base) MCG/ACT inhaler, INHALE TWO PUFFS BY MOUTH EVERY 6 HOURS AS NEEDED FOR WHEEZING OR SHORTNESS OF BREATH, Disp: 8.5 g, Rfl: 1   atorvastatin (LIPITOR) 10 MG tablet, Take 1 tablet (10 mg total) by mouth daily., Disp: 90 tablet, Rfl: 3   Brinzolamide-Brimonidine (SIMBRINZA) 1-0.2 % SUSP, Place 1 drop into both eyes daily., Disp: , Rfl:    CALCIUM PO, Take 600 mg by mouth daily., Disp: , Rfl:    Cholecalciferol (VITAMIN D3) 5000 UNITS CAPS, Take 5,000 Units by mouth daily., Disp: , Rfl:    Magnesium 400 MG CAPS, Take 400 mg by mouth daily., Disp: , Rfl:    metoprolol succinate (TOPROL-XL) 25 MG 24 hr tablet, TAKE 1 TABLET BY MOUTH DAILY, Disp: 90 tablet, Rfl: 1   Multiple Vitamin (MULTIVITAMIN) capsule, Take 1 capsule by mouth daily., Disp: , Rfl:    telmisartan (MICARDIS) 80 MG tablet, Take 1 tablet (80 mg total) by mouth daily., Disp: 90 tablet, Rfl: 1   Cardiovascular & other pertient studies:  Reviewed external labs and tests, independently interpreted  EKG 03/17/2023: Sinus rhythm  LBBB  Coronary CTA 01/2020: 1. Coronary calcium score of 201. This was 89th percentile for age and sex matched control. 2. Normal coronary origin with right dominance. 3.  Mild (25-49%) calcified plaque in a small D1. 4.  Moderate (50-69%) mixed plaque in the mid LCX and OM2. 5.  Will send study for FFRct. 6.  Moderate mitral annular calcification.  1. Left Main: findings  FFRct 0.97  2. LAD: findings FFRct 0.85 proximally, 0.85 mid, 0.80 distal 3. LCX: findings FFRct 0.96 proximal, 0.78 mid, 0.77 distal 4. OM2 finding FFRct 0.90 proximal, 0.81 distal 5. RCA: findings FFRct 0.97 proximal, 0.94 mid, 0.88 distal   IMPRESSION: 1.  FFRct findings consistent with non-obstructive coronary disease    Echocardiogram 10/04/2022: Normal LV systolic function with visual EF 55-60%. Left ventricle cavity is normal in size. The left ventricle is normal thickness, septal bulge. Normal global wall motion. Unable to evaluate diastolic function due to severity of mitral annular  calcification. Elevated LAP. Calculated EF 57%. Left atrial cavity is mildly dilated. Severe calcification of the mitral valve annulus. Native mitral valve with trace regurgitation. No evidence of mitral valve stenosis. Structurally normal tricuspid valve.  Mild tricuspid regurgitation. No significant change compared to 01/2021.   Lexiscan (with Mod Bruce protocol) Nuclear stress test 10/04/2022: Myocardial perfusion is abnormal. There is a fixed severe defect in the lateral and inferior regions. Minimal reversibility cannot be excluded.  Overall LV systolic function is abnormal with regional wall motion abnormalities in the same region. Stress LV EF: 39%.  Nondiagnostic ECG stress. Underlying LBBB. The heart rate response was consistent with Regadenoson.  No previous exam available for comparison. Intermediate risk study.   Recent labs: 03/01/2023: Glucose 98, BUN/Cr 17/1.09. EGFR 55. Na/K 144/4.3. Rest of the CMP normal H/H 11/35. MCV 83. Platelets 232 HbA1C 5.7% Chol 134, TG  61, HDL 59, LDL 62  01/2021: TSH 2.5 normal   Review of Systems  Cardiovascular:  Negative for chest pain, dyspnea on exertion, leg swelling, palpitations and syncope.         Vitals:   05/20/23 1231  BP: 138/82  Pulse: 77  SpO2: 98%    Body mass index is 35.28 kg/m. Filed Weights   05/20/23 1231   Weight: 212 lb (96.2 kg)     Objective:   Physical Exam Vitals and nursing note reviewed.  Constitutional:      General: She is not in acute distress. Neck:     Vascular: No JVD.  Cardiovascular:     Rate and Rhythm: Normal rate and regular rhythm.     Heart sounds: Normal heart sounds. No murmur heard. Pulmonary:     Effort: Pulmonary effort is normal.     Breath sounds: Normal breath sounds. No wheezing or rales.  Musculoskeletal:     Right lower leg: No edema.     Left lower leg: No edema.             Visit diagnoses:   ICD-10-CM   1. Coronary artery disease involving native coronary artery of native heart without angina pectoris  I25.10     2. Essential hypertension  I10     3. LBBB (left bundle branch block)  I44.7           Assessment & Recommendations:   72 y.o. Caucasian female with hypertension, nonobstructive CAD  CAD: Nonobstructive. Continue Aspirin, statin Lipids very well controlled only with Lipitor 10 mg daily.   Hypertension: Controlled  F/u in 1 year    Elder Negus, MD Pager: [phone] Office: [phone]

## 2023-07-06 ENCOUNTER — Encounter: Payer: Self-pay | Admitting: Emergency Medicine

## 2023-07-06 ENCOUNTER — Ambulatory Visit: Payer: BC Managed Care – PPO | Admitting: Emergency Medicine

## 2023-07-06 VITALS — BP 126/74 | HR 75 | Ht 66.0 in | Wt 210.0 lb

## 2023-07-06 DIAGNOSIS — J4520 Mild intermittent asthma, uncomplicated: Secondary | ICD-10-CM

## 2023-07-06 DIAGNOSIS — D869 Sarcoidosis, unspecified: Secondary | ICD-10-CM | POA: Diagnosis not present

## 2023-07-06 NOTE — Progress Notes (Signed)
   Subjective:    Patient ID: Kristine [name], female    DOB: [birth year], 72 y.o.   MRN: [id number]  HPI  ROV 10/14/22 --Kristine [name] is a 74 with a history of sarcoidosis and associated mild obstructive lung disease.  She has a history of sarcoid iritis.  She has mediastinal adenopathy (calcified) on chest imaging but no overt interstitial lung disease.  I saw her 6 weeks ago when she was experiencing more exertional shortness of breath, question some component of deconditioning because she had to decrease her exercise routine when she had a knee replacement.  We repeated pulmonary function testing as below.  Chest x-ray 08/26/2022 did not show any new infiltrates or signs of inflammation from sarcoid.  She did have persistent adenopathy. She underwent a stress echo on 11/20 that was concerning for a fixed perfusion deficit, possibly some reversibility. She is scheduled for cardiac cath w Dr Jacinto Halim on 12/5.  She has had the flu shot. She had covid in mid November - URI sx. She continues to have some cough and congestion.   Pulmonary function testing performed today and reviewed by me, shows normal airflows without a bronchodilator response.  FEV1 2.35 L (90% predicted).  Normal lung volumes with TLC 111%.  Diffusion capacity normal.  FEV1 slightly decreased compared with 2 years ago (2.47 L).  Total lung capacity actually improved.  ROV 07/06/2023 --follow-up visit for 72 year old woman with sarcoidosis and associated mild obstructive lung disease.  She has sarcoid iritis, calcified mediastinal adenopathy but no overt ILD on her prior imaging.  PFTs been reassuring without any evidence of obstruction. Since I last saw her she had knee surgery which seems to have gone well.  A cardiac catheterization was done 10/19/2022 that showed moderate nonobstructive CAD, normal filling pressures without any evidence of PAH She reports today that she is overall doing ok. She does that some SOB sometimes w exertion, also  when she lays flat or on her L side. She uses albuterol rarely.    Review of Systems As per HPI     Objective:   Physical Exam  Vitals:   07/06/23 1403  BP: 126/74  Pulse: 75  SpO2: 98%  Weight: 210 lb (95.3 kg)  Height: 5\' 6"  (1.676 m)    Gen: Pleasant, overwt woman, in no distress,  normal affect  ENT: No lesions,  mouth clear,  oropharynx clear, no postnasal drip  Neck: No JVD, no stridor  Lungs: No use of accessory muscles, no crackles or wheezing on normal respiration, no wheeze on forced expiration  Cardiovascular: RRR, heart sounds normal, no murmur or gallops, no peripheral edema  Musculoskeletal: No deformities, no cyanosis or clubbing  Neuro: alert, awake, non focal  Skin: Warm, no lesions or rash      Assessment & Plan:  Asthma Overall reassuring PFTs but she does have clinical symptoms that could be considered asthma, do seem to respond to albuterol which she uses rarely.  Okay to keep albuterol as needed.  No indication to start scheduled inhaled medication  Sarcoidosis (HCC) Reassuring PFT.  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  Her last high-resolution CT scan of the chest was in 2021 and we will plan to repeat to ensure no interval change.  Overall her evaluation has been reassuring.   Levy Pupa, MD, PhD 07/06/2023, 2:18 PM [facility] [phone] or if no answer [phone]

## 2023-07-06 NOTE — Assessment & Plan Note (Signed)
Reassuring PFT.  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  Her last high-resolution CT scan of the chest was in 2021 and we will plan to repeat to ensure no interval change.  Overall her evaluation has been reassuring.

## 2023-07-06 NOTE — Assessment & Plan Note (Signed)
Overall reassuring PFTs but she does have clinical symptoms that could be considered asthma, do seem to respond to albuterol which she uses rarely.  Okay to keep albuterol as needed.  No indication to start scheduled inhaled medication

## 2023-07-06 NOTE — Patient Instructions (Signed)
We will arrange for a high-resolution CT scan of your chest to compare with your priors. Keep albuterol available to use 2 puffs if needed for shortness of breath, chest tightness, wheezing. Follow with Dr. Delton Coombes next available after your CT so we can review those results together.

## 2023-07-18 ENCOUNTER — Other Ambulatory Visit: Payer: Self-pay | Admitting: Internal Medicine

## 2023-07-22 ENCOUNTER — Other Ambulatory Visit: Payer: Self-pay | Admitting: Internal Medicine

## 2023-07-25 ENCOUNTER — Ambulatory Visit
Admission: RE | Admit: 2023-07-25 | Discharge: 2023-07-25 | Disposition: A | Discharge status: Home / Self Care | Payer: BC Managed Care – PPO | Source: Ambulatory Visit | Attending: Emergency Medicine | Admitting: Emergency Medicine

## 2023-07-25 DIAGNOSIS — D869 Sarcoidosis, unspecified: Secondary | ICD-10-CM

## 2023-08-02 ENCOUNTER — Encounter: Payer: Self-pay | Admitting: Family Medicine

## 2023-08-12 ENCOUNTER — Other Ambulatory Visit: Payer: Self-pay | Admitting: Internal Medicine

## 2023-08-15 ENCOUNTER — Other Ambulatory Visit: Payer: Self-pay | Admitting: Internal Medicine

## 2023-09-22 ENCOUNTER — Ambulatory Visit: Payer: BC Managed Care – PPO | Admitting: Emergency Medicine

## 2023-09-22 ENCOUNTER — Encounter: Payer: Self-pay | Admitting: Emergency Medicine

## 2023-09-22 VITALS — BP 130/80 | HR 78 | Temp 97.9°F | Ht 66.0 in | Wt 214.6 lb

## 2023-09-22 DIAGNOSIS — J4520 Mild intermittent asthma, uncomplicated: Secondary | ICD-10-CM | POA: Diagnosis not present

## 2023-09-22 DIAGNOSIS — D869 Sarcoidosis, unspecified: Secondary | ICD-10-CM | POA: Diagnosis not present

## 2023-09-22 MED ORDER — ALBUTEROL SULFATE HFA 108 (90 BASE) MCG/ACT IN AERS
2.0000 | INHALATION_SPRAY | Freq: Four times a day (QID) | RESPIRATORY_TRACT | 3 refills | Status: AC | PRN
Start: 2023-09-22 — End: ?

## 2023-09-22 NOTE — Assessment & Plan Note (Signed)
Her pulmonary function testing has been reassuring.  She does get some benefit from albuterol and we have presume there is some asthma present.  We have held off on scheduled inhaled medication.  She was told by a colleague that she might get more benefit from ProAir and wants to try this as a replacement for her generic albuterol.  We can certainly make this change.  Further if she believes that she is significantly benefiting from ProAir then it may be time to try starting her on scheduled ICS/LABA.  I asked her to keep track and discuss this at her follow-up visit

## 2023-09-22 NOTE — Patient Instructions (Signed)
We will try substituting ProAir for your generic albuterol to see if you get more benefit. Try using the ProAir 2 puffs at least once daily every day for a few weeks to see if this benefit your breathing.  If so then it may be reasonable for [place] to start you on an every day schedule inhaler medication such as Symbicort or Dulera.  Please call to let us know how you have done on the ProAir so we can decide whether to order 1 of these medications. We reviewed your CT scan of the chest today. We will plan to repeat your CT chest in 1 year Follow-up with APP in about 6 weeks to see how you have done on the ProAir and decide about a scheduled inhaled medicine Follow Dr. Delton Coombes in 1 year to review your CT chest.  Please call so we can see you sooner if you have any problems

## 2023-09-22 NOTE — Assessment & Plan Note (Signed)
Her CT chest is overall stable.  There has been some slight interval increase in size of some of her scattered pulmonary nodules.  The timing (3 years) would be inconsistent with malignancy.  We will plan to repeat her CT chest in 1 year to review for stability.  I do not see any infiltrates or significant changes that would indicate that we need to treat her for a sarcoid flare

## 2023-09-22 NOTE — Progress Notes (Signed)
Subjective:    Patient ID: Kristine [name], female    DOB: Jun 06, 1951, 72 y.o.   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  She has sarcoid iritis, calcified mediastinal adenopathy but no overt ILD on her prior imaging.  PFTs been reassuring without any evidence of obstruction. Since I last saw her she had knee surgery which seems to have gone well.  A cardiac catheterization was done 10/19/2022 that showed moderate nonobstructive CAD, normal filling pressures without any evidence of PAH She reports today that she is overall doing ok. She does that some SOB sometimes w exertion, also when she lays flat or on her L side. She uses albuterol rarely.    ROV 09/22/2023 --72 year old woman with sarcoidosis and mild obstructive lung disease, sarcoid iritis.  Her imaging has shown calcified mediastinal adenopathy but no overt ILD.  She also has moderate nonobstructive CAD, no evidence of pulmonary hypertension.  She has albuterol which she uses rarely - about once a week.  We repeated her CT scan of the chest since her last had been done in 2021  High-resolution CT scan of the chest done on 07/25/2023 reviewed by me, shows numerous enlarged mediastinal nodes unchanged with some calcification.  There are multiple calcified and noncalcified pulmonary nodules similar to her prior CT.  An 8 mm anterior left lower lobe nodule is slightly larger than 2021, right upper lobe nodule is 1.3 cm x 1.2 cm, slightly larger.  There is also an adjacent right upper lobe nodule that slightly larger 8 mm versus 5 mm.  There is no acute consolidation or ILD.  No honeycomb change.  There is extensive evidence for air trapping on the inspiratory/expiratory images   Review of Systems As per HPI     Objective:   Physical Exam  Vitals:   09/22/23 1554  BP: 130/80  Pulse: 78  Temp: 97.9 F (36.6 C)  TempSrc: Oral  SpO2: 98%   Weight: 214 lb 9.6 oz (97.3 kg)  Height: 5\' 6"  (1.676 m)    Gen: Pleasant, overwt woman, in no distress,  normal affect  ENT: No lesions,  mouth clear,  oropharynx clear, no postnasal drip  Neck: No JVD, no stridor  Lungs: No use of accessory muscles, no crackles or wheezing on normal respiration, no wheeze on forced expiration  Cardiovascular: RRR, heart sounds normal, no murmur or gallops, no peripheral edema  Musculoskeletal: No deformities, no cyanosis or clubbing  Neuro: alert, awake, non focal  Skin: Warm, no lesions or rash      Assessment & Plan:  Asthma Her pulmonary function testing has been reassuring.  She does get some benefit from albuterol and we have presume there is some asthma present.  We have held off on scheduled inhaled medication.  She was told by a colleague that she might get more benefit from ProAir and wants to try this as a replacement for her generic albuterol.  We can certainly make this change.  Further if she believes that she is significantly benefiting from ProAir then it may be time to try starting her on scheduled ICS/LABA.  I asked her to keep track and discuss this at her follow-up visit  Sarcoidosis [facility]) Her CT chest is overall stable.  There has been some slight interval increase in size of some of her scattered pulmonary nodules.  The timing (3 years) would be inconsistent with malignancy.  We will  plan to repeat her CT chest in 1 year to review for stability.  I do not see any infiltrates or significant changes that would indicate that we need to treat her for a sarcoid flare    Levy Pupa, MD, PhD 09/22/2023, 4:19 PM [facility] [phone] or if no answer [phone]

## 2023-11-29 ENCOUNTER — Encounter: Payer: Self-pay | Admitting: Emergency Medicine

## 2023-11-29 ENCOUNTER — Ambulatory Visit: Payer: BC Managed Care – PPO | Admitting: Emergency Medicine

## 2024-01-17 ENCOUNTER — Ambulatory Visit: Payer: Self-pay | Admitting: Internal Medicine

## 2024-01-17 ENCOUNTER — Encounter: Payer: Self-pay | Admitting: Internal Medicine

## 2024-01-17 VITALS — BP 136/82 | HR 78 | Temp 98.3°F | Ht 66.0 in | Wt 217.8 lb

## 2024-01-17 DIAGNOSIS — I700 Atherosclerosis of aorta: Secondary | ICD-10-CM | POA: Diagnosis not present

## 2024-01-17 DIAGNOSIS — E66812 Obesity, class 2: Secondary | ICD-10-CM

## 2024-01-17 DIAGNOSIS — R7309 Other abnormal glucose: Secondary | ICD-10-CM | POA: Diagnosis not present

## 2024-01-17 DIAGNOSIS — I119 Hypertensive heart disease without heart failure: Secondary | ICD-10-CM | POA: Diagnosis not present

## 2024-01-17 DIAGNOSIS — Z6835 Body mass index (BMI) 35.0-35.9, adult: Secondary | ICD-10-CM

## 2024-01-17 DIAGNOSIS — E782 Mixed hyperlipidemia: Secondary | ICD-10-CM

## 2024-01-17 DIAGNOSIS — E6609 Other obesity due to excess calories: Secondary | ICD-10-CM

## 2024-01-17 NOTE — Progress Notes (Signed)
 I,Victoria T Deloria Lair, CMA,acting as a Neurosurgeon for Gwynneth Aliment, MD.,have documented all relevant documentation on the behalf of Gwynneth Aliment, MD,as directed by  Gwynneth Aliment, MD while in the presence of Gwynneth Aliment, MD.  Subjective:  Patient ID: Kristine [name] , female    DOB: [birth year] , 73 y.o.   MRN: [id number]  Chief Complaint  Patient presents with   Hypertension   Hyperlipidemia    HPI  Patient presents today for bp & cholesterol follow up. She reports compliance with medications. Denies headache, chest pain & worsening sob.   She states it being very costly. She also has changed her insurance from [place] to [employer].   Hypertension This is a chronic problem. The current episode started more than 1 year ago. The problem has been gradually improving since onset. The problem is controlled. Pertinent negatives include no blurred vision or palpitations. Risk factors for coronary artery disease include sedentary lifestyle, obesity and post-menopausal state. The current treatment provides moderate improvement.     Past Medical History:  Diagnosis Date   Allergy    Arthritis    Asthma    Back pain    Complication of anesthesia    slow to wake up   Dyspnea    Glaucoma    ROD   Heart murmur    Hypertension    Joint pain    Knee pain    Obesity    Sarcoidosis    SOB (shortness of breath)      Family History  Problem Relation Age of Onset   Breast cancer Mother    Lung cancer Mother    Lung cancer Father        smoked   Heart disease Maternal Grandmother    Hypertension Brother    Liver cancer Brother    Pneumonia Brother      Current Outpatient Medications:    albuterol (VENTOLIN HFA) 108 (90 Base) MCG/ACT inhaler, INHALE TWO PUFFS BY MOUTH EVERY 6 HOURS AS NEEDED FOR WHEEZING OR SHORTNESS OF BREATH, Disp: 8.5 g, Rfl: 1   albuterol (VENTOLIN HFA) 108 (90 Base) MCG/ACT inhaler, Inhale 2 puffs into the lungs every 6 (six) hours as needed for wheezing or  shortness of breath., Disp: 8 g, Rfl: 3   Brinzolamide-Brimonidine (SIMBRINZA) 1-0.2 % SUSP, Place 1 drop into both eyes daily., Disp: , Rfl:    CALCIUM PO, Take 600 mg by mouth daily., Disp: , Rfl:    Cholecalciferol (VITAMIN D3) 5000 UNITS CAPS, Take 5,000 Units by mouth daily., Disp: , Rfl:    Magnesium 400 MG CAPS, Take 400 mg by mouth daily., Disp: , Rfl:    metoprolol succinate (TOPROL-XL) 25 MG 24 hr tablet, TAKE 1 TABLET BY MOUTH DAILY, Disp: 90 tablet, Rfl: 1   Multiple Vitamin (MULTIVITAMIN) capsule, Take 1 capsule by mouth daily., Disp: , Rfl:    telmisartan (MICARDIS) 80 MG tablet, TAKE 1 TABLET BY MOUTH DAILY, Disp: 90 tablet, Rfl: 1   Allergies  Allergen Reactions   Crestor [Rosuvastatin Calcium] Other (See Comments)    Pt believes this may have been causing headaches, not sure if it was this or isosorbide mononitrate   Isosorbide     Pt believes this may have been causing headaches, not sure if it was this or crestor     Review of Systems  Constitutional: Negative.   Eyes:  Negative for blurred vision.  Respiratory: Negative.    Cardiovascular: Negative.  Negative for palpitations.  Gastrointestinal: Negative.   Neurological: Negative.   Psychiatric/Behavioral: Negative.       Today's Vitals   01/17/24 1555  BP: 136/82  Pulse: 78  Temp: 98.3 F (36.8 C)  SpO2: 98%  Weight: 217 lb 12.8 oz (98.8 kg)  Height: 5\' 6"  (1.676 m)   Body mass index is 35.15 kg/m.  Wt Readings from Last 3 Encounters:  01/17/24 217 lb 12.8 oz (98.8 kg)  09/22/23 214 lb 9.6 oz (97.3 kg)  07/06/23 210 lb (95.3 kg)    BP Readings from Last 3 Encounters:  01/17/24 136/82  09/22/23 130/80  07/06/23 126/74     Objective:  Physical Exam Vitals and nursing note reviewed.  Constitutional:      Appearance: Normal appearance. She is obese.  HENT:     Head: Normocephalic and atraumatic.  Eyes:     Extraocular Movements: Extraocular movements intact.  Cardiovascular:     Rate and  Rhythm: Normal rate and regular rhythm.     Heart sounds: Normal heart sounds.  Pulmonary:     Effort: Pulmonary effort is normal.     Breath sounds: Normal breath sounds.  Musculoskeletal:     Cervical back: Normal range of motion.  Skin:    General: Skin is warm.  Neurological:     General: No focal deficit present.     Mental Status: She is alert.  Psychiatric:        Mood and Affect: Mood normal.        Behavior: Behavior normal.         Assessment And Plan:  Hypertensive heart disease without heart failure Assessment & Plan: Chronic, fair control. Goal BP<130/80.  She will continue with telmisartan 80mg  daily and metoprolol XK 25mg  daily. She is encouraged to follow a low sodium diet.   Orders: -     CMP14+EGFR -     TSH + free T4  Aortic atherosclerosis (HCC) Assessment & Plan: Chronic, encouraged to follow heart healthy lifestyle. Clean eating, regular exercise and stress management are all a part of this lifestyle. She is also encouraged to continue with statin therapy.     Other abnormal glucose Assessment & Plan: Previous labs reviewed, her A1c has been elevated in the past. I will check an A1c today. Reminded to avoid refined sugars including sugary drinks/foods and processed meats including bacon, sausages and deli meats.    Orders: -     Hemoglobin A1c  Mixed hyperlipidemia Assessment & Plan: Chronic, LDL goal is less than 70 due to underlying aortic atherosclerosis. She will continue with atorvastatin daily. Encouraged to follow a heart healthy lifestyle.  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 Advised to aim for at least 150 minutes of exercise per week.       Return in about 6 months (around 07/19/2024), or WTM- 40 minutes.   Patient was given opportunity to ask questions. Patient verbalized understanding of the plan and was able to repeat key elements of the plan. All questions were answered to their satisfaction.    I, Gwynneth Aliment, MD, have reviewed all documentation for this visit. The documentation on 01/17/24 for the exam, diagnosis, procedures, and orders are all accurate and complete.   IF YOU HAVE  BEEN REFERRED TO A SPECIALIST, IT MAY TAKE 1-2 WEEKS TO SCHEDULE/PROCESS THE REFERRAL. IF YOU HAVE NOT HEARD FROM US/SPECIALIST IN TWO WEEKS, PLEASE GIVE [place] A CALL AT [phone].   THE PATIENT IS ENCOURAGED TO PRACTICE SOCIAL DISTANCING DUE TO THE COVID-19 PANDEMIC.

## 2024-01-18 LAB — CMP14+EGFR
ALT: 16 IU/L (ref 0–32)
AST: 17 IU/L (ref 0–40)
Albumin: 4.3 g/dL (ref 3.8–4.8)
Alkaline Phosphatase: 70 IU/L (ref 44–121)
BUN/Creatinine Ratio: 21 (ref 12–28)
BUN: 20 mg/dL (ref 8–27)
Bilirubin Total: 0.3 mg/dL (ref 0.0–1.2)
CO2: 23 mmol/L (ref 20–29)
Calcium: 9.6 mg/dL (ref 8.7–10.3)
Chloride: 106 mmol/L (ref 96–106)
Creatinine, Ser: 0.95 mg/dL (ref 0.57–1.00)
Globulin, Total: 2.7 g/dL (ref 1.5–4.5)
Glucose: 88 mg/dL (ref 70–99)
Potassium: 4.1 mmol/L (ref 3.5–5.2)
Sodium: 142 mmol/L (ref 134–144)
Total Protein: 7 g/dL (ref 6.0–8.5)
eGFR: 64 mL/min/{1.73_m2} (ref >59)

## 2024-01-18 LAB — TSH+FREE T4
Free T4: 0.97 ng/dL (ref 0.82–1.77)
TSH: 3.21 u[IU]/mL (ref 0.450–4.500)

## 2024-01-18 LAB — HEMOGLOBIN A1C
Est. average glucose Bld gHb Est-mCnc: 114 mg/dL
Hgb A1c MFr Bld: 5.6 % (ref 4.8–5.6)

## 2024-01-28 DIAGNOSIS — R7309 Other abnormal glucose: Secondary | ICD-10-CM | POA: Insufficient documentation

## 2024-01-28 NOTE — Assessment & Plan Note (Signed)
 She is encouraged to strive for BMI less than 30 to decrease cardiac risk. Advised to aim for at least 150 minutes of exercise per week.

## 2024-01-28 NOTE — Patient Instructions (Signed)
 Mediterranean Diet A Mediterranean diet is based on the traditions of countries on the [employer]. It focuses on eating more: Fruits and vegetables. Whole grains, beans, nuts, and seeds. Heart-healthy fats. These are fats that are good for your heart. It involves eating less: Dairy. Meat and eggs. Processed foods with added sugar, salt, and fat. This type of diet can help prevent certain conditions. It can also improve outcomes if you have a long-term (chronic) disease, such as kidney or heart disease. What are tips for following this plan? Reading food labels Check packaged foods for: The serving size. For foods such as rice and pasta, the serving size is the amount of cooked product, not dry. The total fat. Avoid foods with saturated fat or trans fat. Added sugars, such as corn syrup. Shopping  Try to have a balanced diet. Buy a variety of foods, such as: Fresh fruits and vegetables. You may be able to get these from local farmers markets. You can also buy them frozen. Grains, beans, nuts, and seeds. Some of these can be bought in bulk. Fresh seafood. Poultry and eggs. Low-fat dairy products. Buy whole ingredients instead of foods that have already been packaged. If you can't get fresh seafood, buy precooked frozen shrimp or canned fish, such as tuna, salmon, or sardines. Stock your pantry so you always have certain foods on hand, such as olive oil, canned tuna, canned tomatoes, rice, pasta, and beans. Cooking Cook foods with extra-virgin olive oil instead of using butter or other vegetable oils. Have meat as a side dish. Have vegetables or grains as your main dish. This means having meat in small portions or adding small amounts of meat to foods like pasta or stew. Use beans or vegetables instead of meat in common dishes like chili or lasagna. Try out different cooking methods. Try roasting, broiling, steaming, and sauting vegetables. Add frozen vegetables to soups, stews,  pasta, or rice. Add nuts or seeds for added healthy fats and plant protein at each meal. You can add these to yogurt, salads, or vegetable dishes. Marinate fish or vegetables using olive oil, lemon juice, garlic, and fresh herbs. Meal planning Plan to eat a vegetarian meal one day each week. Try to work up to two vegetarian meals, if possible. Eat seafood two or more times a week. Have healthy snacks on hand. These may include: Vegetable sticks with hummus. Greek yogurt. Fruit and nut trail mix. Eat balanced meals. These should include: Fruit: 2-3 servings a day. Vegetables: 4-5 servings a day. Low-fat dairy: 2 servings a day. Fish, poultry, or lean meat: 1 serving a day. Beans and legumes: 2 or more servings a week. Nuts and seeds: 1-2 servings a day. Whole grains: 6-8 servings a day. Extra-virgin olive oil: 3-4 servings a day. Limit red meat and sweets to just a few servings a month. Lifestyle  Try to cook and eat meals with your family. Drink enough fluid to keep your pee (urine) pale yellow. Be active every day. This includes: Aerobic exercise, which is exercise that causes your heart to beat faster. Examples include running and swimming. Leisure activities like gardening, walking, or housework. Get 7-8 hours of sleep each night. Drink red wine if your provider says you can. A glass of wine is 5 oz (150 mL). You may be allowed to have: Up to 1 glass a day if you're female and not pregnant. Up to 2 glasses a day if you're female. What foods should I eat? Fruits Apples. Apricots. Avocado.  Berries. Bananas. Cherries. Dates. Figs. Grapes. Lemons. Melon. Oranges. Peaches. Plums. Pomegranate. Vegetables Artichokes. Beets. Broccoli. Cabbage. Carrots. Eggplant. Green beans. Chard. Kale. Spinach. Onions. Leeks. Peas. Squash. Tomatoes. Peppers. Radishes. Grains Whole-grain pasta. Brown rice. Bulgur wheat. Polenta. Couscous. Whole-wheat bread. Orpah Cobb. Meats and other  proteins Beans. Almonds. Sunflower seeds. Pine nuts. Peanuts. Cod. Salmon. Scallops. Shrimp. Tuna. Tilapia. Clams. Oysters. Eggs. Chicken or [place] without skin. Dairy Low-fat milk. Cheese. Greek yogurt. Fats and oils Extra-virgin olive oil. Avocado oil. Grapeseed oil. Beverages Water. Red wine. Herbal tea. Sweets and desserts Greek yogurt with honey. Baked apples. Poached pears. Trail mix. Seasonings and condiments Basil. Cilantro. Coriander. Cumin. Mint. Parsley. Sage. Rosemary. Tarragon. Garlic. Oregano. Thyme. Pepper. Balsamic vinegar. Tahini. Hummus. Tomato sauce. Olives. Mushrooms. The items listed above may not be all the foods and drinks you can have. Talk to a dietitian to learn more. What foods should I limit? This is a list of foods that should be eaten rarely. Fruits Fruit canned in syrup. Vegetables Deep-fried potatoes, like [place] fries. Grains Packaged pasta or rice dishes. Cereal with added sugar. Snacks with added sugar. Meats and other proteins Beef. Pork. Lamb. Chicken or [place] with skin. Hot dogs. Tomasa Blase. Dairy Ice cream. Sour cream. Whole milk. Fats and oils Butter. Canola oil. Vegetable oil. Beef fat (tallow). Lard. Beverages Juice. Sugar-sweetened soft drinks. Beer. Liquor and spirits. Sweets and desserts Cookies. Cakes. Pies. Candy. Seasonings and condiments Mayonnaise. Pre-made sauces and marinades. The items listed above may not be all the foods and drinks you should limit. Talk to a dietitian to learn more. Where to find more information American Heart Association (AHA): heart.org This information is not intended to replace advice given to you by your health care provider. Make sure you discuss any questions you have with your health care provider. Document Revised: 02/13/2023 Document Reviewed: 02/13/2023 Elsevier Patient Education  2024 [employer].

## 2024-01-28 NOTE — Assessment & Plan Note (Signed)
 Chronic, LDL goal is less than 70 due to underlying aortic atherosclerosis. She will continue with atorvastatin daily. Encouraged to follow a heart healthy lifestyle.

## 2024-01-28 NOTE — Assessment & Plan Note (Signed)
 Chronic, encouraged to follow heart healthy lifestyle. Clean eating, regular exercise and stress management are all a part of this lifestyle. She is also encouraged to continue with statin therapy.

## 2024-01-28 NOTE — Assessment & Plan Note (Signed)
 Chronic, fair control. Goal BP<130/80.  She will continue with telmisartan 80mg  daily and metoprolol XK 25mg  daily. She is encouraged to follow a low sodium diet.

## 2024-01-28 NOTE — Assessment & Plan Note (Signed)
 Previous labs reviewed, her A1c has been elevated in the past. I will check an A1c today. Reminded to avoid refined sugars including sugary drinks/foods and processed meats including bacon, sausages and deli meats.

## 2024-03-01 ENCOUNTER — Other Ambulatory Visit: Payer: Self-pay

## 2024-03-05 ENCOUNTER — Other Ambulatory Visit: Payer: Self-pay | Admitting: Internal Medicine

## 2024-03-05 DIAGNOSIS — Z0000 Encounter for general adult medical examination without abnormal findings: Secondary | ICD-10-CM

## 2024-03-07 ENCOUNTER — Ambulatory Visit
Admission: RE | Admit: 2024-03-07 | Discharge: 2024-03-07 | Disposition: A | Discharge status: Home / Self Care | Source: Ambulatory Visit | Attending: Internal Medicine | Admitting: Internal Medicine

## 2024-03-07 DIAGNOSIS — Z0000 Encounter for general adult medical examination without abnormal findings: Secondary | ICD-10-CM

## 2024-05-21 ENCOUNTER — Ambulatory Visit: Payer: Self-pay | Admitting: Cardiology

## 2024-07-19 ENCOUNTER — Ambulatory Visit (INDEPENDENT_AMBULATORY_CARE_PROVIDER_SITE_OTHER): Admitting: Internal Medicine

## 2024-07-19 ENCOUNTER — Encounter: Payer: Self-pay | Admitting: Internal Medicine

## 2024-07-19 VITALS — BP 126/82 | HR 84 | Temp 98.0°F | Ht 66.0 in | Wt 210.0 lb

## 2024-07-19 DIAGNOSIS — D860 Sarcoidosis of lung: Secondary | ICD-10-CM | POA: Diagnosis not present

## 2024-07-19 DIAGNOSIS — E66811 Obesity, class 1: Secondary | ICD-10-CM

## 2024-07-19 DIAGNOSIS — E6609 Other obesity due to excess calories: Secondary | ICD-10-CM | POA: Diagnosis not present

## 2024-07-19 DIAGNOSIS — Z6833 Body mass index (BMI) 33.0-33.9, adult: Secondary | ICD-10-CM

## 2024-07-19 DIAGNOSIS — Z23 Encounter for immunization: Secondary | ICD-10-CM

## 2024-07-19 DIAGNOSIS — I119 Hypertensive heart disease without heart failure: Secondary | ICD-10-CM | POA: Diagnosis not present

## 2024-07-19 DIAGNOSIS — Z0000 Encounter for general adult medical examination without abnormal findings: Secondary | ICD-10-CM

## 2024-07-19 DIAGNOSIS — I700 Atherosclerosis of aorta: Secondary | ICD-10-CM | POA: Diagnosis not present

## 2024-07-19 DIAGNOSIS — E782 Mixed hyperlipidemia: Secondary | ICD-10-CM | POA: Diagnosis not present

## 2024-07-19 DIAGNOSIS — E2839 Other primary ovarian failure: Secondary | ICD-10-CM | POA: Diagnosis not present

## 2024-07-19 DIAGNOSIS — R0601 Orthopnea: Secondary | ICD-10-CM | POA: Diagnosis not present

## 2024-07-19 DIAGNOSIS — R7309 Other abnormal glucose: Secondary | ICD-10-CM

## 2024-07-19 DIAGNOSIS — I3481 Nonrheumatic mitral (valve) annulus calcification: Secondary | ICD-10-CM | POA: Diagnosis not present

## 2024-07-19 DIAGNOSIS — I25118 Atherosclerotic heart disease of native coronary artery with other forms of angina pectoris: Secondary | ICD-10-CM | POA: Diagnosis not present

## 2024-07-19 LAB — POCT URINALYSIS DIP (CLINITEK)
Bilirubin, UA: NEGATIVE
Blood, UA: NEGATIVE
Glucose, UA: NEGATIVE mg/dL
Ketones, POC UA: NEGATIVE mg/dL
Nitrite, UA: NEGATIVE
POC PROTEIN,UA: NEGATIVE
Spec Grav, UA: 1.025 (ref 1.010–1.025)
Urobilinogen, UA: 0.2 U/dL
pH, UA: 5.5 (ref 5.0–8.0)

## 2024-07-19 MED ORDER — VYZULTA 0.024 % OP SOLN: OPHTHALMIC | Status: AC

## 2024-07-19 NOTE — Progress Notes (Signed)
 Subjective:    Kristine [name] is a 73 y.o. female who presents for a Welcome to Medicare exam.   Discussed the use of AI scribe software for clinical note transcription with the patient, who gave verbal consent to proceed.  History of Present Illness Kristine [name] is a 73 year old female with hypertension and coronary artery disease who presents for a welcome to Medicare visit.  She experiences shortness of breath when lying down, particularly on her left side, which improves upon sitting up. She describes feeling 'terrible laying in bed' but 'much better' upon getting up. A previous echocardiogram from November 2023 noted severe calcification of the mitral valve.  Her medical history includes hypertension, coronary artery calcifications, plaque in the aorta, and calcification of the mitral valve. Current medications include metoprolol , telmisartan , aspirin , vitamin D , calcium , magnesium, Simbrinza eye drops three times a day, and Vyzulta  eye drops once daily. She is allergic to Crestor  and Imdur , which previously caused headaches.  She has a history of sarcoidosis with nodules noted in a CT scan of the chest from September 2023. She reports no recent follow-up with her cardiologist and mentions a missed appointment in July 2025.  She has not been on her cholesterol medication, Lipitor, for six months due to a prescription issue. Her lipids were previously well controlled with Lipitor.  She mentions a 'touch of glaucoma' and is currently using Simbrinza and Vyzulta  eye drops for her eye pressures. She has not had her vision checked during this visit but plans to do so on the way out.  She retired in June 2025 but returned to work part-time in August 2025, working 20 hours a week. She does not exercise regularly, attributing it to 'just being lazy' after retirement.    Cardiac Risk Factors include: hypertension, obesity ,hyperlipidemia, over 65      Objective:    Today's  Vitals   07/19/24 1007  BP: 126/82  Pulse: 84  Temp: 98 F (36.7 C)  SpO2: 98%  Weight: 210 lb (95.3 kg)  Height: 5' 6 (1.676 m)  Body mass index is 33.89 kg/m.  Medications Outpatient Encounter Medications as of 07/19/2024  Medication Sig   albuterol  (VENTOLIN  HFA) 108 (90 Base) MCG/ACT inhaler INHALE TWO PUFFS BY MOUTH EVERY 6 HOURS AS NEEDED FOR WHEEZING OR SHORTNESS OF BREATH   albuterol  (VENTOLIN  HFA) 108 (90 Base) MCG/ACT inhaler Inhale 2 puffs into the lungs every 6 (six) hours as needed for wheezing or shortness of breath.   Brinzolamide -Brimonidine  (SIMBRINZA) 1-0.2 % SUSP Place 1 drop into both eyes daily.   CALCIUM  PO Take 600 mg by mouth daily.   Cholecalciferol (VITAMIN D3) 5000 UNITS CAPS Take 5,000 Units by mouth daily.   Latanoprostene Bunod  (VYZULTA ) 0.024 % SOLN One gtt OU once daily   Magnesium 400 MG CAPS Take 400 mg by mouth daily.   metoprolol  succinate (TOPROL -XL) 25 MG 24 hr tablet TAKE 1 TABLET BY MOUTH DAILY   Multiple Vitamin (MULTIVITAMIN) capsule Take 1 capsule by mouth daily.   telmisartan  (MICARDIS ) 80 MG tablet TAKE 1 TABLET BY MOUTH DAILY   atorvastatin  (LIPITOR) 10 MG tablet Take 1 tablet (10 mg total) by mouth daily.   No facility-administered encounter medications on file as of 07/19/2024.     History: Past Medical History:  Diagnosis Date   Allergy     Arthritis    Asthma    Back pain    Complication of anesthesia    slow to wake  up   Dyspnea    Glaucoma    ROD   Heart murmur    Hypertension    Joint pain    Knee pain    Obesity    Sarcoidosis    SOB (shortness of breath)    Past Surgical History:  Procedure Laterality Date   ABDOMINAL HYSTERECTOMY     BUNIONECTOMY     left   CHOLECYSTECTOMY     CORONARY PRESSURE/FFR STUDY N/A 10/19/2022   Procedure: INTRAVASCULAR PRESSURE WIRE/FFR STUDY;  Surgeon: Elmira Newman PARAS, MD;  Location: [facility];  Service: Cardiovascular;  Laterality: N/A;   KNEE CLOSED REDUCTION Right  11/09/2022   Procedure: CLOSED MANIPULATION KNEE;  Surgeon: Ernie Cough, MD;  Location: [facility];  Service: Orthopedics;  Laterality: Right;   KNEE SURGERY     right   KNEE SURGERY  11/09/2022   LEFT HEART CATHETERIZATION WITH CORONARY ANGIOGRAM N/A 09/12/2014   Procedure: LEFT HEART CATHETERIZATION WITH CORONARY ANGIOGRAM;  Surgeon: Debby DELENA Sor, MD;  Location: [facility] CATH LAB;  Service: Cardiovascular;  Laterality: N/A;   RIGHT/LEFT HEART CATH AND CORONARY ANGIOGRAPHY N/A 10/19/2022   Procedure: RIGHT/LEFT HEART CATH AND CORONARY ANGIOGRAPHY;  Surgeon: Elmira Newman PARAS, MD;  Location: [facility];  Service: Cardiovascular;  Laterality: N/A;   TOTAL KNEE ARTHROPLASTY Right 03/09/2022   Procedure: TOTAL KNEE ARTHROPLASTY;  Surgeon: Ernie Cough, MD;  Location: [facility];  Service: Orthopedics;  Laterality: Right;   TOTAL KNEE ARTHROPLASTY Left 11/09/2022   Procedure: TOTAL KNEE ARTHROPLASTY;  Surgeon: Ernie Cough, MD;  Location: [facility];  Service: Orthopedics;  Laterality: Left;    Family History  Problem Relation Age of Onset   Breast cancer Mother    Lung cancer Mother    Lung cancer Father        smoked   Heart disease Maternal Grandmother    Hypertension Brother    Liver cancer Brother    Pneumonia Brother    Social History   Occupational History   Occupation: Admin Occupational hygienist  Tobacco Use   Smoking status: Never   Smokeless tobacco: Never  Vaping Use   Vaping status: Never Used  Substance and Sexual Activity   Alcohol use: Yes    Comment: occ   Drug use: No   Sexual activity: Not Currently    Birth control/protection: Surgical    Comment: HYST     Tobacco Counseling Counseling given: Yes   Immunizations and Health Maintenance Immunization History  Administered Date(s) Administered   Fluad Quad(high Dose 65+) 08/04/2020, 02/09/2022, 09/22/2022, 09/03/2023   INFLUENZA, HIGH DOSE SEASONAL PF 07/30/2019, 07/19/2024   Influenza Split  09/13/2013   Influenza Whole 09/13/2018   Influenza,inj,Quad PF,6+ Mos 09/30/2014   PFIZER Comirnaty(Gray Top)Covid-19 Tri-Sucrose Vaccine 03/25/2021, 09/03/2023   PFIZER(Purple Top)SARS-COV-2 Vaccination 12/21/2019, 01/11/2020, 09/09/2020   PNEUMOCOCCAL CONJUGATE-20 03/17/2023   PPD Test 05/30/2015   Pneumococcal Polysaccharide-23 04/17/2018   Tdap 07/30/2009, 02/04/2020   Health Maintenance Due  Topic Date Due   COVID-19 Vaccine (6 - 2025-26 season) 07/16/2024    Activities of Daily Living    07/19/2024   10:11 AM  In your present state of health, do you have any difficulty performing the following activities:  Hearing? 0  Vision? 0  Difficulty concentrating or making decisions? 0  Walking or climbing stairs? 0  Dressing or bathing? 0  Doing errands, shopping? 0  Preparing Food and eating ? N  Using the Toilet? N  In the past six  months, have you accidently leaked urine? N  Do you have problems with loss of bowel control? N  Managing your Medications? N  Managing your Finances? N  Housekeeping or managing your Housekeeping? N    Physical Exam   Physical Exam Vitals and nursing note reviewed.  Constitutional:      Appearance: Normal appearance. She is obese.  HENT:     Head: Normocephalic and atraumatic.     Right Ear: Tympanic membrane, ear canal and external ear normal. Drainage present. There is no impacted cerumen.     Left Ear: Tympanic membrane, ear canal and external ear normal. There is no impacted cerumen.     Mouth/Throat:     Mouth: Mucous membranes are moist.     Pharynx: Oropharynx is clear.  Eyes:     Extraocular Movements: Extraocular movements intact.     Conjunctiva/sclera: Conjunctivae normal.     Pupils: Pupils are equal, round, and reactive to light.  Cardiovascular:     Rate and Rhythm: Normal rate and regular rhythm.     Pulses: Normal pulses.     Heart sounds: Murmur heard.  Pulmonary:     Effort: Pulmonary effort is normal.     Breath  sounds: Normal breath sounds.  Chest:  Breasts:    Tanner Score is 5.     Right: Normal.     Left: Normal.  Abdominal:     General: Bowel sounds are normal.     Palpations: Abdomen is soft.  Genitourinary:    Comments: deferred Musculoskeletal:        General: Normal range of motion.     Cervical back: Normal range of motion and neck supple.  Skin:    General: Skin is warm and dry.  Neurological:     General: No focal deficit present.     Mental Status: She is alert and oriented to person, place, and time.  Psychiatric:        Mood and Affect: Mood normal.        Behavior: Behavior normal.      Advanced Directives: Does Patient Have a Medical Advance Directive?: Yes Type of Advance Directive: Healthcare Power of Attorney Does patient want to make changes to medical advance directive?: Yes (MAU/Ambulatory/Procedural Areas - Information given) Copy of Healthcare Power of Attorney in Chart?: No - copy requested  EKG:  unchanged from previous tracings, NSR, LBBB, left axis deviation      Assessment:    This is a routine wellness examination for this patient .  Vision/Hearing screen Hearing Screening   500Hz  1000Hz  2000Hz  4000Hz   Right ear Pass Pass Pass Pass  Left ear Pass Pass Pass Pass   Vision Screening   Right eye Left eye Both eyes  Without correction     With correction 20/30 20/50 20/20      Goals       Stay In Shape (pt-stated)      Change eating habits. Watch what is eaten. Start exercising consistently.         Depression Screen    07/19/2024   10:15 AM 01/17/2024    4:02 PM 11/02/2022   11:07 AM 11/02/2022   11:06 AM  PHQ 2/9 Scores  PHQ - 2 Score 0 0 0 0  PHQ- 9 Score  0       Fall Risk    07/19/2024   10:17 AM  Fall Risk   Falls in the past year? 0  Number falls in past yr: 0  Injury with Fall? 0  Risk for fall due to : No Fall Risks  Follow up Falls evaluation completed    Cognitive Function:        07/19/2024   10:18 AM  6CIT  Screen  What Year? 0 points  What month? 0 points  What time? 0 points  Count back from 20 0 points  Months in reverse 0 points  Repeat phrase 2 points  Total Score 2 points    Patient Care Team: Jarold Medici, MD as PCP - General (Internal Medicine) Dohmeier, Dedra, MD as Consulting Physician (Neurology)     Plan:   Encounter for Medicare annual wellness exam Assessment & Plan: The annual wellness visit was performed including discussion of advanced directives, assessment of functional status and cognitive function. EKG performed, NSR w/ LBBB - no new findings.   A full exam was also performed. She will rto in one year for AWV with [facility] Advisor.  A full exam was performed.  Importance of monthly self breast exams was discussed with the patient.  She is advised to get 30-45 minutes of regular exercise, no less than four to five days per week. Both weight-bearing and aerobic exercises are recommended.  She is advised to follow a healthy diet with at least six fruits/veggies per day, decrease intake of red meat and other saturated fats and to increase fish intake to twice weekly.  Meats/fish should not be fried -- baked, boiled or broiled is preferable. It is also important to cut back on your sugar intake.  Be sure to read labels - try to avoid anything with added sugar, high fructose corn syrup or other sweeteners.  If you must use a sweetener, you can try stevia or monkfruit.  It is also important to avoid artificially sweetened foods/beverages and diet drinks. Lastly, wear SPF 50 sunscreen on exposed skin and when in direct sunlight for an extended period of time.  Be sure to avoid fast food restaurants and aim for at least 60 ounces of water  daily.         Hypertensive heart disease without heart failure Assessment & Plan: Chronic, fair control.  Goal BP<130/80.  She will continue with metoprolol  XL 71m daily and telmisartan  80mg  daily.  Coronary artery calcifications, aortic plaque,  and severe mitral valve calcification noted. Orthopnea suggests possible heart failure. - Order echocardiogram to assess heart function and valve status. - Follow low sodium diet.  - Refer to cardiologist, she is aware of new location  Orders: -     CBC -     CMP14+EGFR -     Lipid panel -     EKG 12-Lead -     POCT URINALYSIS DIP (CLINITEK) -     Microalbumin / creatinine urine ratio  Coronary artery disease involving native coronary artery of native heart with other form of angina pectoris [facility]) Assessment & Plan: Seen on CT scan September 2024:   Results are as follows:  There is aortic atherosclerosis, as well as atherosclerosis of the great vessels of the mediastinum and the coronary arteries, including calcified atherosclerotic plaque in the left main, left anterior descending and right coronary arteries. - Chronic, LDL goal is less than 70 - Unfortunately, she has stopped statin therapy - Previously, she had issues with rosuvastatin ; however, tolerated atorvastatin  without any issues - Check lipid panel today, consider restarting atorvastatin  - Check echocardiogram   Aortic atherosclerosis [facility]) Assessment & Plan: Incidental finding on CT.  Chronic, encouraged to follow  heart healthy lifestyle. Clean eating, regular exercise and stress management are all a part of this lifestyle.  - Plan is to resume atorvastatin  after reviewing lipids - Start ASA 81mg  daily    Mitral valve annular calcification -     ECHOCARDIOGRAM COMPLETE; Future  Mixed hyperlipidemia Assessment & Plan: Off cholesterol medication for six months. Previous lipid levels controlled with Lipitor. - Order lipid panel. - Restart cholesterol medication based on test results.   Orthopnea Assessment & Plan: Shortness of breath when lying down, possibly related to heart failure due to mitral valve calcification. - Order echocardiogram to evaluate heart function and valve status. - Refer to cardiologist for  further evaluation.  Orders: -     ECHOCARDIOGRAM COMPLETE; Future  Pulmonary sarcoidosis (HCC) Assessment & Plan: Chronic.  Previous CT showed findings consistent with sarcoidosis. No recent pulmonologist follow-up. - Review previous CT findings.   Other abnormal glucose Assessment & Plan: Previous labs reviewed, her A1c has been elevated in the past. I will check an A1c today. Reminded to avoid refined sugars including sugary drinks/foods and processed meats including bacon, sausages and deli meats.    Orders: -     CMP14+EGFR -     Hemoglobin A1c  Estrogen deficiency -     DG Bone Density; Future  Class 1 obesity due to excess calories with serious comorbidity and body mass index (BMI) of 33.0 to 33.9 in adult Assessment & Plan: She is encouraged to strive for BMI less than 30 to decrease cardiac risk. Advised to aim for at least 150 minutes of exercise per week.    Immunization due -     Flu vaccine HIGH DOSE PF(Fluzone Trivalent)  Other orders -     Vyzulta ; One gtt OU once daily -     Atorvastatin  Calcium ; Take 1 tablet (10 mg total) by mouth daily.  Dispense: 90 tablet; Refill: 3    I have personally reviewed and noted the following in the patient's chart:   Medical and social history Use of alcohol, tobacco or illicit drugs  Current medications and supplements including opioid prescriptions. Patient is not currently taking opioid prescriptions. Functional ability and status Nutritional status Physical activity Advanced directives List of other physicians Hospitalizations, surgeries, and ER visits in previous 12 months Vitals Screenings to include cognitive, depression, and falls Referrals and appointments  In addition, I have reviewed and discussed with patient certain preventive protocols, quality metrics, and best practice recommendations. A written personalized care plan for preventive services as well as general preventive health recommendations were  provided to patient.     Catheryn LOISE Slocumb, MD 07/23/2024

## 2024-07-19 NOTE — Patient Instructions (Addendum)
 Kristine [name] , Thank you for taking time to come for your Medicare Wellness Visit. I appreciate your ongoing commitment to your health goals. Please review the following plan we discussed and let me know if I can assist you in the future.   These are the goals we discussed:  Goals       Stay In Shape (pt-stated)      Change eating habits. Watch what is eaten. Start exercising consistently.         This is a list of the screening recommended for you and due dates:  Health Maintenance  Topic Date Due   COVID-19 Vaccine (6 - 2025-26 season) 07/16/2024   Zoster (Shingles) Vaccine (1 of 2) 10/18/2024*   Medicare Annual Wellness Visit  07/19/2025   Mammogram  03/07/2026   Colon Cancer Screening  06/18/2028   DTaP/Tdap/Td vaccine (3 - Td or Tdap) 02/03/2030   Pneumococcal Vaccine for age over 25  Completed   Flu Shot  Completed   DEXA scan (bone density measurement)  Completed   Hepatitis C Screening  Completed   HPV Vaccine  Aged Out   Meningitis B Vaccine  Aged Out  *Topic was postponed. The date shown is not the original due date.     Health Maintenance, Female Adopting a healthy lifestyle and getting preventive care are important in promoting health and wellness. Ask your health care provider about: The right schedule for you to have regular tests and exams. Things you can do on your own to prevent diseases and keep yourself healthy. What should I know about diet, weight, and exercise? Eat a healthy diet  Eat a diet that includes plenty of vegetables, fruits, low-fat dairy products, and lean protein. Do not eat a lot of foods that are high in solid fats, added sugars, or sodium. Maintain a healthy weight Body mass index (BMI) is used to identify weight problems. It estimates body fat based on height and weight. Your health care provider can help determine your BMI and help you achieve or maintain a healthy weight. Get regular exercise Get regular exercise. This is one of the  most important things you can do for your health. Most adults should: Exercise for at least 150 minutes each week. The exercise should increase your heart rate and make you sweat (moderate-intensity exercise). Do strengthening exercises at least twice a week. This is in addition to the moderate-intensity exercise. Spend less time sitting. Even light physical activity can be beneficial. Watch cholesterol and blood lipids Have your blood tested for lipids and cholesterol at 72 years of age, then have this test every 5 years. Have your cholesterol levels checked more often if: Your lipid or cholesterol levels are high. You are older than 72 years of age. You are at high risk for heart disease. What should I know about cancer screening? Depending on your health history and family history, you may need to have cancer screening at various ages. This may include screening for: Breast cancer. Cervical cancer. Colorectal cancer. Skin cancer. Lung cancer. What should I know about heart disease, diabetes, and high blood pressure? Blood pressure and heart disease High blood pressure causes heart disease and increases the risk of stroke. This is more likely to develop in people who have high blood pressure readings or are overweight. Have your blood pressure checked: Every 3-5 years if you are 23-26 years of age. Every year if you are 54 years old or older. Diabetes Have regular diabetes screenings. This checks  your fasting blood sugar level. Have the screening done: Once every three years after age 18 if you are at a normal weight and have a low risk for diabetes. More often and at a younger age if you are overweight or have a high risk for diabetes. What should I know about preventing infection? Hepatitis B If you have a higher risk for hepatitis B, you should be screened for this virus. Talk with your health care provider to find out if you are at risk for hepatitis B infection. Hepatitis  C Testing is recommended for: Everyone born from [birth year] through 1965. Anyone with known risk factors for hepatitis C. Sexually transmitted infections (STIs) Get screened for STIs, including gonorrhea and chlamydia, if: You are sexually active and are younger than 72 years of age. You are older than 72 years of age and your health care provider tells you that you are at risk for this type of infection. Your sexual activity has changed since you were last screened, and you are at increased risk for chlamydia or gonorrhea. Ask your health care provider if you are at risk. Ask your health care provider about whether you are at high risk for HIV. Your health care provider may recommend a prescription medicine to help prevent HIV infection. If you choose to take medicine to prevent HIV, you should first get tested for HIV. You should then be tested every 3 months for as long as you are taking the medicine. Pregnancy If you are about to stop having your period (premenopausal) and you may become pregnant, seek counseling before you get pregnant. Take 400 to 800 micrograms (mcg) of folic acid  every day if you become pregnant. Ask for birth control (contraception) if you want to prevent pregnancy. Osteoporosis and menopause Osteoporosis is a disease in which the bones lose minerals and strength with aging. This can result in bone fractures. If you are 5 years old or older, or if you are at risk for osteoporosis and fractures, ask your health care provider if you should: Be screened for bone loss. Take a calcium  or vitamin D  supplement to lower your risk of fractures. Be given hormone replacement therapy (HRT) to treat symptoms of menopause. Follow these instructions at home: Alcohol use Do not drink alcohol if: Your health care provider tells you not to drink. You are pregnant, may be pregnant, or are planning to become pregnant. If you drink alcohol: Limit how much you have to: 0-1 drink a day. Know  how much alcohol is in your drink. In the [place] one drink equals one 12 oz bottle of beer (355 mL), one 5 oz glass of wine (148 mL), or one 1 oz glass of hard liquor (44 mL). Lifestyle Do not use any products that contain nicotine or tobacco. These products include cigarettes, chewing tobacco, and vaping devices, such as e-cigarettes. If you need help quitting, ask your health care provider. Do not use street drugs. Do not share needles. Ask your health care provider for help if you need support or information about quitting drugs. General instructions Schedule regular health, dental, and eye exams. Stay current with your vaccines. Tell your health care provider if: You often feel depressed. You have ever been abused or do not feel safe at home. Summary Adopting a healthy lifestyle and getting preventive care are important in promoting health and wellness. Follow your health care provider's instructions about healthy diet, exercising, and getting tested or screened for diseases. Follow your health care provider's instructions  on monitoring your cholesterol and blood pressure. This information is not intended to replace advice given to you by your health care provider. Make sure you discuss any questions you have with your health care provider. Document Revised: 03/23/2021 Document Reviewed: 03/23/2021 Elsevier Patient Education  2024 [employer] Health Maintenance, Female Adopting a healthy lifestyle and getting preventive care are important in promoting health and wellness. Ask your health care provider about: The right schedule for you to have regular tests and exams. Things you can do on your own to prevent diseases and keep yourself healthy. What should I know about diet, weight, and exercise? Eat a healthy diet  Eat a diet that includes plenty of vegetables, fruits, low-fat dairy products, and lean protein. Do not eat a lot of foods that are high in solid fats, added sugars, or  sodium. Maintain a healthy weight Body mass index (BMI) is used to identify weight problems. It estimates body fat based on height and weight. Your health care provider can help determine your BMI and help you achieve or maintain a healthy weight. Get regular exercise Get regular exercise. This is one of the most important things you can do for your health. Most adults should: Exercise for at least 150 minutes each week. The exercise should increase your heart rate and make you sweat (moderate-intensity exercise). Do strengthening exercises at least twice a week. This is in addition to the moderate-intensity exercise. Spend less time sitting. Even light physical activity can be beneficial. Watch cholesterol and blood lipids Have your blood tested for lipids and cholesterol at 71 years of age, then have this test every 5 years. Have your cholesterol levels checked more often if: Your lipid or cholesterol levels are high. You are older than 72 years of age. You are at high risk for heart disease. What should I know about cancer screening? Depending on your health history and family history, you may need to have cancer screening at various ages. This may include screening for: Breast cancer. Cervical cancer. Colorectal cancer. Skin cancer. Lung cancer. What should I know about heart disease, diabetes, and high blood pressure? Blood pressure and heart disease High blood pressure causes heart disease and increases the risk of stroke. This is more likely to develop in people who have high blood pressure readings or are overweight. Have your blood pressure checked: Every 3-5 years if you are 61-41 years of age. Every year if you are 52 years old or older. Diabetes Have regular diabetes screenings. This checks your fasting blood sugar level. Have the screening done: Once every three years after age 22 if you are at a normal weight and have a low risk for diabetes. More often and at a younger  age if you are overweight or have a high risk for diabetes. What should I know about preventing infection? Hepatitis B If you have a higher risk for hepatitis B, you should be screened for this virus. Talk with your health care provider to find out if you are at risk for hepatitis B infection. Hepatitis C Testing is recommended for: Everyone born from [birth year] through 1965. Anyone with known risk factors for hepatitis C. Sexually transmitted infections (STIs) Get screened for STIs, including gonorrhea and chlamydia, if: You are sexually active and are younger than 72 years of age. You are older than 72 years of age and your health care provider tells you that you are at risk for this type of infection. Your sexual activity has changed since you  were last screened, and you are at increased risk for chlamydia or gonorrhea. Ask your health care provider if you are at risk. Ask your health care provider about whether you are at high risk for HIV. Your health care provider may recommend a prescription medicine to help prevent HIV infection. If you choose to take medicine to prevent HIV, you should first get tested for HIV. You should then be tested every 3 months for as long as you are taking the medicine. Pregnancy If you are about to stop having your period (premenopausal) and you may become pregnant, seek counseling before you get pregnant. Take 400 to 800 micrograms (mcg) of folic acid  every day if you become pregnant. Ask for birth control (contraception) if you want to prevent pregnancy. Osteoporosis and menopause Osteoporosis is a disease in which the bones lose minerals and strength with aging. This can result in bone fractures. If you are 23 years old or older, or if you are at risk for osteoporosis and fractures, ask your health care provider if you should: Be screened for bone loss. Take a calcium  or vitamin D  supplement to lower your risk of fractures. Be given hormone replacement therapy  (HRT) to treat symptoms of menopause. Follow these instructions at home: Alcohol use Do not drink alcohol if: Your health care provider tells you not to drink. You are pregnant, may be pregnant, or are planning to become pregnant. If you drink alcohol: Limit how much you have to: 0-1 drink a day. Know how much alcohol is in your drink. In the [place] one drink equals one 12 oz bottle of beer (355 mL), one 5 oz glass of wine (148 mL), or one 1 oz glass of hard liquor (44 mL). Lifestyle Do not use any products that contain nicotine or tobacco. These products include cigarettes, chewing tobacco, and vaping devices, such as e-cigarettes. If you need help quitting, ask your health care provider. Do not use street drugs. Do not share needles. Ask your health care provider for help if you need support or information about quitting drugs. General instructions Schedule regular health, dental, and eye exams. Stay current with your vaccines. Tell your health care provider if: You often feel depressed. You have ever been abused or do not feel safe at home. Summary Adopting a healthy lifestyle and getting preventive care are important in promoting health and wellness. Follow your health care provider's instructions about healthy diet, exercising, and getting tested or screened for diseases. Follow your health care provider's instructions on monitoring your cholesterol and blood pressure. This information is not intended to replace advice given to you by your health care provider. Make sure you discuss any questions you have with your health care provider. Document Revised: 03/23/2021 Document Reviewed: 03/23/2021 Elsevier Patient Education  2024 [employer].

## 2024-07-20 LAB — CMP14+EGFR
ALT: 13 IU/L (ref 0–32)
AST: 17 IU/L (ref 0–40)
Albumin: 4.1 g/dL (ref 3.8–4.8)
Alkaline Phosphatase: 68 IU/L (ref 44–121)
BUN/Creatinine Ratio: 13 (ref 12–28)
BUN: 14 mg/dL (ref 8–27)
Bilirubin Total: 0.6 mg/dL (ref 0.0–1.2)
CO2: 20 mmol/L (ref 20–29)
Calcium: 9.6 mg/dL (ref 8.7–10.3)
Chloride: 104 mmol/L (ref 96–106)
Creatinine, Ser: 1.07 mg/dL — ABNORMAL HIGH (ref 0.57–1.00)
Globulin, Total: 2.6 g/dL (ref 1.5–4.5)
Glucose: 86 mg/dL (ref 70–99)
Potassium: 4 mmol/L (ref 3.5–5.2)
Sodium: 141 mmol/L (ref 134–144)
Total Protein: 6.7 g/dL (ref 6.0–8.5)
eGFR: 56 mL/min/1.73 — ABNORMAL LOW (ref >59)

## 2024-07-20 LAB — CBC
Hematocrit: 39.2 % (ref 34.0–46.6)
Hemoglobin: 12.4 g/dL (ref 11.1–15.9)
MCH: 26.7 pg (ref 26.6–33.0)
MCHC: 31.6 g/dL (ref 31.5–35.7)
MCV: 85 fL (ref 79–97)
Platelets: 235 x10E3/uL (ref 150–450)
RBC: 4.64 x10E6/uL (ref 3.77–5.28)
RDW: 13.6 % (ref 11.7–15.4)
WBC: 5.8 x10E3/uL (ref 3.4–10.8)

## 2024-07-20 LAB — LIPID PANEL
Chol/HDL Ratio: 3 ratio (ref 0.0–4.4)
Cholesterol, Total: 173 mg/dL (ref 100–199)
HDL: 58 mg/dL (ref >39)
LDL Chol Calc (NIH): 101 mg/dL — ABNORMAL HIGH (ref 0–99)
Triglycerides: 72 mg/dL (ref 0–149)
VLDL Cholesterol Cal: 14 mg/dL (ref 5–40)

## 2024-07-20 LAB — HEMOGLOBIN A1C
Est. average glucose Bld gHb Est-mCnc: 103 mg/dL
Hgb A1c MFr Bld: 5.2 % (ref 4.8–5.6)

## 2024-07-20 LAB — MICROALBUMIN / CREATININE URINE RATIO
Creatinine, Urine: 235.3 mg/dL
Microalb/Creat Ratio: 8 mg/g{creat} (ref 0–29)
Microalbumin, Urine: 18.5 ug/mL

## 2024-07-21 ENCOUNTER — Ambulatory Visit: Payer: Self-pay | Admitting: Internal Medicine

## 2024-07-21 DIAGNOSIS — I3481 Nonrheumatic mitral (valve) annulus calcification: Secondary | ICD-10-CM

## 2024-07-21 DIAGNOSIS — I25118 Atherosclerotic heart disease of native coronary artery with other forms of angina pectoris: Secondary | ICD-10-CM

## 2024-07-23 DIAGNOSIS — R0601 Orthopnea: Secondary | ICD-10-CM | POA: Insufficient documentation

## 2024-07-23 MED ORDER — ATORVASTATIN CALCIUM 10 MG PO TABS: 10.0000 mg | ORAL_TABLET | Freq: Every day | ORAL | 3 refills | Status: AC

## 2024-07-23 NOTE — Assessment & Plan Note (Signed)
 Seen on CT scan September 2024:   Results are as follows:  There is aortic atherosclerosis, as well as atherosclerosis of the great vessels of the mediastinum and the coronary arteries, including calcified atherosclerotic plaque in the left main, left anterior descending and right coronary arteries. - Chronic, LDL goal is less than 70 - Unfortunately, she has stopped statin therapy - Previously, she had issues with rosuvastatin ; however, tolerated atorvastatin  without any issues - Check lipid panel today, consider restarting atorvastatin  - Check echocardiogram

## 2024-07-23 NOTE — Assessment & Plan Note (Signed)
 Incidental finding on CT.  Chronic, encouraged to follow heart healthy lifestyle. Clean eating, regular exercise and stress management are all a part of this lifestyle.  - Plan is to resume atorvastatin  after reviewing lipids - Start ASA 81mg  daily

## 2024-07-23 NOTE — Assessment & Plan Note (Signed)
 Off cholesterol medication for six months. Previous lipid levels controlled with Lipitor. - Order lipid panel. - Restart cholesterol medication based on test results.

## 2024-07-23 NOTE — Assessment & Plan Note (Signed)
 Previous labs reviewed, her A1c has been elevated in the past. I will check an A1c today. Reminded to avoid refined sugars including sugary drinks/foods and processed meats including bacon, sausages and deli meats.

## 2024-07-23 NOTE — Assessment & Plan Note (Signed)
 She is encouraged to strive for BMI less than 30 to decrease cardiac risk. Advised to aim for at least 150 minutes of exercise per week.

## 2024-07-23 NOTE — Assessment & Plan Note (Signed)
 Chronic.  Previous CT showed findings consistent with sarcoidosis. No recent pulmonologist follow-up. - Review previous CT findings.

## 2024-07-23 NOTE — Assessment & Plan Note (Signed)
 Shortness of breath when lying down, possibly related to heart failure due to mitral valve calcification. - Order echocardiogram to evaluate heart function and valve status. - Refer to cardiologist for further evaluation.

## 2024-07-23 NOTE — Assessment & Plan Note (Signed)
 Chronic, fair control.  Goal BP<130/80.  She will continue with metoprolol  XL 73m daily and telmisartan  80mg  daily.  Coronary artery calcifications, aortic plaque, and severe mitral valve calcification noted. Orthopnea suggests possible heart failure. - Order echocardiogram to assess heart function and valve status. - Follow low sodium diet.  - Refer to cardiologist, she is aware of new location

## 2024-07-23 NOTE — Assessment & Plan Note (Signed)
 The annual wellness visit was performed including discussion of advanced directives, assessment of functional status and cognitive function. EKG performed, NSR w/ LBBB - no new findings.   A full exam was also performed. She will rto in one year for AWV with [facility] Advisor.  A full exam was performed.  Importance of monthly self breast exams was discussed with the patient.  She is advised to get 30-45 minutes of regular exercise, no less than four to five days per week. Both weight-bearing and aerobic exercises are recommended.  She is advised to follow a healthy diet with at least six fruits/veggies per day, decrease intake of red meat and other saturated fats and to increase fish intake to twice weekly.  Meats/fish should not be fried -- baked, boiled or broiled is preferable. It is also important to cut back on your sugar intake.  Be sure to read labels - try to avoid anything with added sugar, high fructose corn syrup or other sweeteners.  If you must use a sweetener, you can try stevia or monkfruit.  It is also important to avoid artificially sweetened foods/beverages and diet drinks. Lastly, wear SPF 50 sunscreen on exposed skin and when in direct sunlight for an extended period of time.  Be sure to avoid fast food restaurants and aim for at least 60 ounces of water  daily.

## 2024-07-25 ENCOUNTER — Other Ambulatory Visit: Payer: Self-pay | Admitting: Internal Medicine

## 2024-07-25 ENCOUNTER — Ambulatory Visit (HOSPITAL_BASED_OUTPATIENT_CLINIC_OR_DEPARTMENT_OTHER)
Admission: RE | Admit: 2024-07-25 | Discharge: 2024-07-25 | Disposition: A | Discharge status: Home / Self Care | Payer: Self-pay | Source: Ambulatory Visit | Attending: Internal Medicine | Admitting: Internal Medicine

## 2024-07-25 DIAGNOSIS — E2839 Other primary ovarian failure: Secondary | ICD-10-CM | POA: Diagnosis not present

## 2024-08-01 ENCOUNTER — Ambulatory Visit (HOSPITAL_COMMUNITY)
Admission: RE | Admit: 2024-08-01 | Discharge: 2024-08-01 | Disposition: A | Discharge status: Home / Self Care | Payer: Self-pay | Source: Ambulatory Visit | Attending: Internal Medicine | Admitting: Internal Medicine

## 2024-08-01 DIAGNOSIS — R0601 Orthopnea: Secondary | ICD-10-CM | POA: Diagnosis not present

## 2024-08-01 DIAGNOSIS — E785 Hyperlipidemia, unspecified: Secondary | ICD-10-CM | POA: Insufficient documentation

## 2024-08-01 DIAGNOSIS — I10 Essential (primary) hypertension: Secondary | ICD-10-CM | POA: Diagnosis not present

## 2024-08-01 DIAGNOSIS — I342 Nonrheumatic mitral (valve) stenosis: Secondary | ICD-10-CM | POA: Insufficient documentation

## 2024-08-01 DIAGNOSIS — I2510 Atherosclerotic heart disease of native coronary artery without angina pectoris: Secondary | ICD-10-CM | POA: Diagnosis not present

## 2024-08-01 DIAGNOSIS — I340 Nonrheumatic mitral (valve) insufficiency: Secondary | ICD-10-CM | POA: Insufficient documentation

## 2024-08-01 DIAGNOSIS — I3481 Nonrheumatic mitral (valve) annulus calcification: Secondary | ICD-10-CM | POA: Diagnosis not present

## 2024-08-01 LAB — ECHOCARDIOGRAM COMPLETE
AR max vel: 1.59 cm2
AV Area VTI: 1.53 cm2
AV Area mean vel: 1.55 cm2
AV Mean grad: 9 mmHg
AV Peak grad: 15.4 mmHg
Ao pk vel: 1.97 m/s
Area-P 1/2: 4.06 cm2
MV VTI: 1.72 cm2
S' Lateral: 2.8 cm

## 2024-08-01 NOTE — Progress Notes (Signed)
  Echocardiogram 2D Echocardiogram has been performed.  Koleen KANDICE Popper, RDCS 08/01/2024, 2:54 PM

## 2024-08-30 ENCOUNTER — Ambulatory Visit (HOSPITAL_COMMUNITY)
Admission: RE | Admit: 2024-08-30 | Discharge: 2024-08-30 | Disposition: A | Discharge status: Home / Self Care | Source: Ambulatory Visit | Attending: Emergency Medicine | Admitting: Emergency Medicine

## 2024-08-30 DIAGNOSIS — D860 Sarcoidosis of lung: Secondary | ICD-10-CM | POA: Diagnosis not present

## 2024-08-30 DIAGNOSIS — D869 Sarcoidosis, unspecified: Secondary | ICD-10-CM | POA: Diagnosis not present

## 2024-08-30 DIAGNOSIS — I700 Atherosclerosis of aorta: Secondary | ICD-10-CM | POA: Diagnosis not present

## 2024-09-12 DIAGNOSIS — G8929 Other chronic pain: Secondary | ICD-10-CM | POA: Diagnosis not present

## 2024-09-12 DIAGNOSIS — M545 Low back pain, unspecified: Secondary | ICD-10-CM | POA: Diagnosis not present

## 2024-09-12 DIAGNOSIS — M546 Pain in thoracic spine: Secondary | ICD-10-CM | POA: Diagnosis not present

## 2024-09-19 ENCOUNTER — Ambulatory Visit: Payer: Self-pay | Admitting: Emergency Medicine

## 2024-09-20 NOTE — Progress Notes (Signed)
 Called and spoke to pt - advised of CT results per Dr. Shelah. Pt verbalized understanding, NFN

## 2024-10-01 DIAGNOSIS — M546 Pain in thoracic spine: Secondary | ICD-10-CM | POA: Diagnosis not present

## 2024-10-02 DIAGNOSIS — H4043X4 Glaucoma secondary to eye inflammation, bilateral, indeterminate stage: Secondary | ICD-10-CM | POA: Diagnosis not present

## 2024-10-02 DIAGNOSIS — H524 Presbyopia: Secondary | ICD-10-CM | POA: Diagnosis not present

## 2024-10-02 DIAGNOSIS — H25813 Combined forms of age-related cataract, bilateral: Secondary | ICD-10-CM | POA: Diagnosis not present

## 2024-10-02 LAB — OPHTHALMOLOGY REPORT-SCANNED

## 2024-10-05 DIAGNOSIS — M4726 Other spondylosis with radiculopathy, lumbar region: Secondary | ICD-10-CM | POA: Diagnosis not present

## 2024-10-05 DIAGNOSIS — G8929 Other chronic pain: Secondary | ICD-10-CM | POA: Diagnosis not present

## 2024-10-05 DIAGNOSIS — M546 Pain in thoracic spine: Secondary | ICD-10-CM | POA: Diagnosis not present

## 2024-10-16 DIAGNOSIS — H4043X4 Glaucoma secondary to eye inflammation, bilateral, indeterminate stage: Secondary | ICD-10-CM | POA: Diagnosis not present

## 2024-10-19 DIAGNOSIS — H209 Unspecified iridocyclitis: Secondary | ICD-10-CM | POA: Diagnosis not present

## 2024-10-19 DIAGNOSIS — D869 Sarcoidosis, unspecified: Secondary | ICD-10-CM | POA: Diagnosis not present

## 2024-10-19 DIAGNOSIS — H4043X1 Glaucoma secondary to eye inflammation, bilateral, mild stage: Secondary | ICD-10-CM | POA: Diagnosis not present

## 2024-10-19 DIAGNOSIS — H2513 Age-related nuclear cataract, bilateral: Secondary | ICD-10-CM | POA: Diagnosis not present

## 2024-10-19 DIAGNOSIS — H2013 Chronic iridocyclitis, bilateral: Secondary | ICD-10-CM | POA: Diagnosis not present

## 2024-10-20 DIAGNOSIS — M545 Low back pain, unspecified: Secondary | ICD-10-CM | POA: Diagnosis not present

## 2024-10-22 DIAGNOSIS — H4043X4 Glaucoma secondary to eye inflammation, bilateral, indeterminate stage: Secondary | ICD-10-CM | POA: Diagnosis not present

## 2024-11-09 ENCOUNTER — Telehealth: Payer: Self-pay | Admitting: Pharmacist

## 2024-11-09 DIAGNOSIS — E7800 Pure hypercholesterolemia, unspecified: Secondary | ICD-10-CM

## 2024-11-09 NOTE — Progress Notes (Signed)
" ° °  11/09/2024 Name: Kristine [name] MRN: [id number] DOB: [birth year]  Chief Complaint  Patient presents with   Medication Adherence    Atorvastatin    Pharmacy Quality Measure Review  This patient is appearing on a report for being at risk of failing the adherence measure for cholesterol (statin) medications this calendar year.   Medication: Atorvastatin  10 mg Last fill date: 10/25/24 for 90 day supply  Insurance report was not up to date. No action needed at this time.   Called Pharmacy to be sure of pick up.  [name], PharmD, BCACP Clinical Pharmacist [phone]   "

## 2024-12-12 ENCOUNTER — Ambulatory Visit: Admitting: Internal Medicine

## 2024-12-12 ENCOUNTER — Encounter: Payer: Self-pay | Admitting: Internal Medicine

## 2024-12-12 ENCOUNTER — Ambulatory Visit: Payer: Self-pay | Admitting: Internal Medicine

## 2024-12-12 VITALS — BP 130/80 | HR 69 | Temp 98.8°F | Ht 66.0 in | Wt 212.0 lb

## 2024-12-12 DIAGNOSIS — I700 Atherosclerosis of aorta: Secondary | ICD-10-CM | POA: Diagnosis not present

## 2024-12-12 DIAGNOSIS — I119 Hypertensive heart disease without heart failure: Secondary | ICD-10-CM

## 2024-12-12 DIAGNOSIS — H409 Unspecified glaucoma: Secondary | ICD-10-CM | POA: Diagnosis not present

## 2024-12-12 DIAGNOSIS — I25118 Atherosclerotic heart disease of native coronary artery with other forms of angina pectoris: Secondary | ICD-10-CM | POA: Diagnosis not present

## 2024-12-12 DIAGNOSIS — G4733 Obstructive sleep apnea (adult) (pediatric): Secondary | ICD-10-CM

## 2024-12-12 DIAGNOSIS — Z79899 Other long term (current) drug therapy: Secondary | ICD-10-CM

## 2024-12-12 DIAGNOSIS — G44209 Tension-type headache, unspecified, not intractable: Secondary | ICD-10-CM | POA: Diagnosis not present

## 2024-12-12 DIAGNOSIS — N3941 Urge incontinence: Secondary | ICD-10-CM | POA: Diagnosis not present

## 2024-12-12 DIAGNOSIS — I3481 Nonrheumatic mitral (valve) annulus calcification: Secondary | ICD-10-CM | POA: Diagnosis not present

## 2024-12-12 LAB — POCT URINALYSIS DIPSTICK
Bilirubin, UA: NEGATIVE
Blood, UA: NEGATIVE
Glucose, UA: NEGATIVE
Ketones, UA: NEGATIVE
Nitrite, UA: NEGATIVE
Protein, UA: NEGATIVE
Spec Grav, UA: >=1.03 — AB
Urobilinogen, UA: 0.2 U/dL
pH, UA: 6

## 2024-12-12 NOTE — Patient Instructions (Addendum)
 Pelvic floor therapy  Hypertension, Adult Hypertension is another name for high blood pressure. High blood pressure forces your heart to work harder to pump blood. This can cause problems over time. There are two numbers in a blood pressure reading. There is a top number (systolic) over a bottom number (diastolic). It is best to have a blood pressure that is below 120/80. What are the causes? The cause of this condition is not known. Some other conditions can lead to high blood pressure. What increases the risk? Some lifestyle factors can make you more likely to develop high blood pressure: Smoking. Not getting enough exercise or physical activity. Being overweight. Having too much fat, sugar, calories, or salt (sodium) in your diet. Drinking too much alcohol. Other risk factors include: Having any of these conditions: Heart disease. Diabetes. High cholesterol. Kidney disease. Obstructive sleep apnea. Having a family history of high blood pressure and high cholesterol. Age. The risk increases with age. Stress. What are the signs or symptoms? High blood pressure may not cause symptoms. Very high blood pressure (hypertensive crisis) may cause: Headache. Fast or uneven heartbeats (palpitations). Shortness of breath. Nosebleed. Vomiting or feeling like you may vomit (nauseous). Changes in how you see. Very bad chest pain. Feeling dizzy. Seizures. How is this treated? This condition is treated by making healthy lifestyle changes, such as: Eating healthy foods. Exercising more. Drinking less alcohol. Your doctor may prescribe medicine if lifestyle changes do not help enough and if: Your top number is above 130. Your bottom number is above 80. Your personal target blood pressure may vary. Follow these instructions at home: Eating and drinking  If told, follow the DASH eating plan. To follow this plan: Fill one half of your plate at each meal with fruits and vegetables. Fill  one fourth of your plate at each meal with whole grains. Whole grains include whole-wheat pasta, brown rice, and whole-grain bread. Eat or drink low-fat dairy products, such as skim milk or low-fat yogurt. Fill one fourth of your plate at each meal with low-fat (lean) proteins. Low-fat proteins include fish, chicken without skin, eggs, beans, and tofu. Avoid fatty meat, cured and processed meat, or chicken with skin. Avoid pre-made or processed food. Limit the amount of salt in your diet to less than 1,500 mg each day. Do not drink alcohol if: Your doctor tells you not to drink. You are pregnant, may be pregnant, or are planning to become pregnant. If you drink alcohol: Limit how much you have to: 0-1 drink a day for women. 0-2 drinks a day for men. Know how much alcohol is in your drink. In the [place] one drink equals one 12 oz bottle of beer (355 mL), one 5 oz glass of wine (148 mL), or one 1 oz glass of hard liquor (44 mL). Lifestyle  Work with your doctor to stay at a healthy weight or to lose weight. Ask your doctor what the best weight is for you. Get at least 30 minutes of exercise that causes your heart to beat faster (aerobic exercise) most days of the week. This may include walking, swimming, or biking. Get at least 30 minutes of exercise that strengthens your muscles (resistance exercise) at least 3 days a week. This may include lifting weights or doing Pilates. Do not smoke or use any products that contain nicotine or tobacco. If you need help quitting, ask your doctor. Check your blood pressure at home as told by your doctor. Keep all follow-up visits. Medicines Take  over-the-counter and prescription medicines only as told by your doctor. Follow directions carefully. Do not skip doses of blood pressure medicine. The medicine does not work as well if you skip doses. Skipping doses also puts you at risk for problems. Ask your doctor about side effects or reactions to medicines  that you should watch for. Contact a doctor if: You think you are having a reaction to the medicine you are taking. You have headaches that keep coming back. You feel dizzy. You have swelling in your ankles. You have trouble with your vision. Get help right away if: You get a very bad headache. You start to feel mixed up (confused). You feel weak or numb. You feel faint. You have very bad pain in your: Chest. Belly (abdomen). You vomit more than once. You have trouble breathing. These symptoms may be an emergency. Get help right away. Call 911. Do not wait to see if the symptoms will go away. Do not drive yourself to the hospital. Summary Hypertension is another name for high blood pressure. High blood pressure forces your heart to work harder to pump blood. For most people, a normal blood pressure is less than 120/80. Making healthy choices can help lower blood pressure. If your blood pressure does not get lower with healthy choices, you may need to take medicine. This information is not intended to replace advice given to you by your health care provider. Make sure you discuss any questions you have with your health care provider. Document Revised: 08/20/2021 Document Reviewed: 08/20/2021 Elsevier Patient Education  2024 [employer].

## 2024-12-12 NOTE — Progress Notes (Signed)
 I,Victoria T Emmitt, CMA,acting as a neurosurgeon for Catheryn LOISE Slocumb, MD.,have documented all relevant documentation on the behalf of Catheryn LOISE Slocumb, MD,as directed by  Catheryn LOISE Slocumb, MD while in the presence of Catheryn LOISE Slocumb, MD.  Subjective:  Patient ID: Kristine [name] , female    DOB: [birth year] , 74 y.o.   MRN: [id number]  Chief Complaint  Patient presents with   Hypertension    Patient presents today for bp & cholesterol follow up. She reports compliance with medications. Denies chest pain , dizziness & worsening sob.  She states waking up with bad headaches in the mornings. During this time she notices high bp.  She recently had dose changed for eye drops to reduce eye pressure. She is to take Diamox 500MG .  She does not know if the change in medication could be a result in how she feels.    Hyperlipidemia    HPI Discussed the use of AI scribe software for clinical note transcription with the patient, who gave verbal consent to proceed.  History of Present Illness Kristine [name] is a 74 year old female with hypertension and glaucoma who presents with headaches and concerns about blood pressure.  She has been experiencing persistent headaches located at the occipital region for the past few weeks, which she attributes to her glaucoma and the use of eye drops. Her blood pressure was recorded at 169/87 at home, prompting her to seek evaluation, although it was 130/80 during the visit. No chest pain, shortness of breath, or palpitations.  She has a history of glaucoma in both eyes and manages it with multiple eye drops including Cymbriza, Vyzulta , and timolol. Cymbriza is taken three times a day, while the other two drops are used at night. She was previously on 1000 mg of Diamox but reduced to 500 mg due to tingling sensations in her hands.  She has a history of sleep apnea and reports that she is supposed to use a CPAP machine, but she has not been using it. A sleep study was  conducted in 2018, and she has lost weight since then. She reports not having restful sleep recently, which she attributes to her husband sharing the bed again after a long period of sleeping separately.  She is considering weight loss options and has joined [employer]. She inquired about [facility] for weight loss but has not made a decision yet.  She experiences bladder control issues, specifically an urge to urinate and difficulty reaching the bathroom in time. Some improvement is noted by responding promptly to the urge. She has not undergone pelvic floor therapy.  She takes aspirin  and atorvastatin  regularly and has started drinking more water  to improve hydration.   Hypertension This is a chronic problem. The current episode started more than 1 year ago. The problem has been gradually improving since onset. The problem is controlled. Associated symptoms include headaches. Pertinent negatives include no blurred vision or palpitations. Risk factors for coronary artery disease include sedentary lifestyle, obesity and post-menopausal state. The current treatment provides moderate improvement.     Past Medical History:  Diagnosis Date   Allergy     Arthritis    Asthma    Back pain    Complication of anesthesia    slow to wake up   Dyspnea    Glaucoma    ROD   Heart murmur    Hypertension    Joint pain    Knee pain    Obesity  Sarcoidosis    SOB (shortness of breath)      Family History  Problem Relation Age of Onset   Breast cancer Mother    Lung cancer Mother    Lung cancer Father        smoked   Heart disease Maternal Grandmother    Hypertension Brother    Liver cancer Brother    Pneumonia Brother      Current Outpatient Medications:    acetaZOLAMIDE ER (DIAMOX) 500 MG capsule, Take 500 mg by mouth., Disp: , Rfl:    albuterol  (VENTOLIN  HFA) 108 (90 Base) MCG/ACT inhaler, INHALE TWO PUFFS BY MOUTH EVERY 6 HOURS AS NEEDED FOR WHEEZING OR SHORTNESS OF BREATH, Disp:  8.5 g, Rfl: 1   albuterol  (VENTOLIN  HFA) 108 (90 Base) MCG/ACT inhaler, Inhale 2 puffs into the lungs every 6 (six) hours as needed for wheezing or shortness of breath., Disp: 8 g, Rfl: 3   atorvastatin  (LIPITOR) 10 MG tablet, Take 1 tablet (10 mg total) by mouth daily., Disp: 90 tablet, Rfl: 3   Brinzolamide -Brimonidine  (SIMBRINZA) 1-0.2 % SUSP, Place 1 drop into both eyes daily., Disp: , Rfl:    CALCIUM  PO, Take 600 mg by mouth daily., Disp: , Rfl:    Cholecalciferol (VITAMIN D3) 5000 UNITS CAPS, Take 5,000 Units by mouth daily., Disp: , Rfl:    Latanoprostene Bunod  (VYZULTA ) 0.024 % SOLN, One gtt OU once daily, Disp: , Rfl:    Magnesium 400 MG CAPS, Take 400 mg by mouth daily., Disp: , Rfl:    metoprolol  succinate (TOPROL -XL) 25 MG 24 hr tablet, TAKE 1 TABLET BY MOUTH DAILY, Disp: 90 tablet, Rfl: 1   Multiple Vitamin (MULTIVITAMIN) capsule, Take 1 capsule by mouth daily., Disp: , Rfl:    telmisartan  (MICARDIS ) 80 MG tablet, TAKE 1 TABLET BY MOUTH DAILY, Disp: 90 tablet, Rfl: 1   Allergies  Allergen Reactions   Crestor  [Rosuvastatin  Calcium ] Other (See Comments)    Pt believes this may have been causing headaches, not sure if it was this or isosorbide  mononitrate   Isosorbide      Pt believes this may have been causing headaches, not sure if it was this or crestor      Review of Systems  Constitutional: Negative.   Eyes:  Negative for blurred vision.  Respiratory: Negative.    Cardiovascular: Negative.  Negative for palpitations.  Gastrointestinal: Negative.   Neurological:  Positive for headaches.  Psychiatric/Behavioral: Negative.       Today's Vitals   12/12/24 1546  BP: 130/80  Pulse: 69  Temp: 98.8 F (37.1 C)  SpO2: 98%  Weight: 212 lb (96.2 kg)  Height: 5' 6 (1.676 m)   Body mass index is 34.22 kg/m.  Wt Readings from Last 3 Encounters:  12/12/24 212 lb (96.2 kg)  07/19/24 210 lb (95.3 kg)  01/17/24 217 lb 12.8 oz (98.8 kg)    The 10-year ASCVD risk score  (Arnett DK, et al., 2019) is: 12.1%   Values used to calculate the score:     Age: 89 years     Clinically relevant sex: Female     Is Non-Hispanic African American: Yes     Diabetic: No     Tobacco smoker: No     Systolic Blood Pressure: 130 mmHg     Is BP treated: Yes     HDL Cholesterol: 58 mg/dL     Total Cholesterol: 173 mg/dL  Objective:  Physical Exam Vitals and nursing note reviewed.  Constitutional:  Appearance: Normal appearance. She is obese.  HENT:     Head: Normocephalic and atraumatic.  Eyes:     Extraocular Movements: Extraocular movements intact.  Cardiovascular:     Rate and Rhythm: Normal rate and regular rhythm.     Heart sounds: Normal heart sounds.  Pulmonary:     Effort: Pulmonary effort is normal.     Breath sounds: Normal breath sounds.  Musculoskeletal:     Cervical back: Normal range of motion.  Skin:    General: Skin is warm.  Neurological:     General: No focal deficit present.     Mental Status: She is alert.  Psychiatric:        Mood and Affect: Mood normal.        Behavior: Behavior normal.      Assessment And Plan:   Assessment & Plan Hypertensive heart disease without heart failure Chronic, fair control. Blood pressure discrepancies noted between home and office readings. Blood pressure readings were 169/87 and 130/80. Scheduled nurse visit next week or the week after to recheck blood pressure. - Bring home blood pressure machine to the nurse visit for accuracy check. Aortic atherosclerosis Incidental finding on CT.  Chronic, encouraged to follow heart healthy lifestyle. Clean eating, regular exercise and stress management are all a part of this lifestyle.  - Plan is to resume atorvastatin  after reviewing lipids - Continue with ASA 81mg  daily  Coronary artery disease involving native coronary artery of native heart with other form of angina pectoris Seen on CT scan September 2024:   Results are as follows:  There is aortic  atherosclerosis, as well as atherosclerosis of the great vessels of the mediastinum and the coronary arteries, including calcified atherosclerotic plaque in the left main, left anterior descending and right coronary arteries. - Chronic, LDL goal is less than 70  Mitral valve annular calcification Seen on echo. Follow up echo every 2-3 years. Tension headache Advised patient this is likely due to musculoskeletal issue. I am sure she has concomitant headache due to elevated blood pressures.  - Consider magnesium supplementation nightly - Stay well hydrated. Glaucoma of both eyes, unspecified glaucoma type Chronic, as per Ophthalmology.  Bilateral glaucoma with elevated intraocular pressure. Reduced acetazolamide due to side effects. Discussed CPAP benefits for glaucoma risk reduction. - Continue acetazolamide 500 mg daily. - Continue current eye drop regimen. - Consider CPAP use if sleep apnea is confirmed. - Follow up with glaucoma specialist in February. OSA (obstructive sleep apnea) Previously diagnosed, not using CPAP. Discussed CPAP benefits for glaucoma and health. Consider repeat sleep study due to weight loss. - Ordered repeat sleep study to reassess need for CPAP. Urge incontinence Experiencing urge incontinence, improved with timely bathroom visits. Discussed pelvic floor therapy benefits. Hesitant about therapy, open to exercises. - Encouraged pelvic floor exercises independently. - Provided sample urine test to rule out bladder infection. - Checked potassium levels due to acetazolamide use. Polypharmacy     Orders Placed This Encounter  Procedures   BMP8+EGFR   CBC no Diff   Ambulatory referral to Neurology   POCT Urinalysis Dipstick (18997)   Return in 13 days (on 12/25/2024), or if symptoms worsen or fail to improve.  Patient was given opportunity to ask questions. Patient verbalized understanding of the plan and was able to repeat key elements of the plan. All  questions were answered to their satisfaction.   I, Catheryn LOISE Slocumb, MD, have reviewed all documentation for this visit. The documentation on 12/12/24 for  the exam, diagnosis, procedures, and orders are all accurate and complete.   IF YOU HAVE BEEN REFERRED TO A SPECIALIST, IT MAY TAKE 1-2 WEEKS TO SCHEDULE/PROCESS THE REFERRAL. IF YOU HAVE NOT HEARD FROM US /SPECIALIST IN TWO WEEKS, PLEASE GIVE US  A CALL AT [phone] X [phone].

## 2024-12-12 NOTE — Assessment & Plan Note (Addendum)
 Seen on CT scan September 2024:   Results are as follows:  There is aortic atherosclerosis, as well as atherosclerosis of the great vessels of the mediastinum and the coronary arteries, including calcified atherosclerotic plaque in the left main, left anterior descending and right coronary arteries. - Chronic, LDL goal is less than 70

## 2024-12-12 NOTE — Assessment & Plan Note (Addendum)
 Chronic, fair control. Blood pressure discrepancies noted between home and office readings. Blood pressure readings were 169/87 and 130/80. Scheduled nurse visit next week or the week after to recheck blood pressure. - Bring home blood pressure machine to the nurse visit for accuracy check.

## 2024-12-12 NOTE — Assessment & Plan Note (Addendum)
 Incidental finding on CT.  Chronic, encouraged to follow heart healthy lifestyle. Clean eating, regular exercise and stress management are all a part of this lifestyle.  - Plan is to resume atorvastatin  after reviewing lipids - Continue with ASA 81mg  daily

## 2024-12-13 LAB — CBC
Hematocrit: 39 % (ref 34.0–46.6)
Hemoglobin: 12.5 g/dL (ref 11.1–15.9)
MCH: 27.1 pg (ref 26.6–33.0)
MCHC: 32.1 g/dL (ref 31.5–35.7)
MCV: 85 fL (ref 79–97)
Platelets: 216 10*3/uL (ref 150–450)
RBC: 4.61 x10E6/uL (ref 3.77–5.28)
RDW: 13.7 % (ref 11.7–15.4)
WBC: 5.9 10*3/uL (ref 3.4–10.8)

## 2024-12-13 LAB — BMP8+EGFR
BUN/Creatinine Ratio: 18 (ref 12–28)
BUN: 17 mg/dL (ref 8–27)
CO2: 18 mmol/L — ABNORMAL LOW (ref 20–29)
Calcium: 9.7 mg/dL (ref 8.7–10.3)
Chloride: 108 mmol/L — ABNORMAL HIGH (ref 96–106)
Creatinine, Ser: 0.94 mg/dL (ref 0.57–1.00)
Glucose: 82 mg/dL (ref 70–99)
Potassium: 4.1 mmol/L (ref 3.5–5.2)
Sodium: 140 mmol/L (ref 134–144)
eGFR: 64 mL/min/{1.73_m2}

## 2024-12-25 ENCOUNTER — Ambulatory Visit: Payer: Self-pay

## 2025-01-17 ENCOUNTER — Ambulatory Visit: Payer: Self-pay | Admitting: Internal Medicine

## 2025-07-29 ENCOUNTER — Encounter: Payer: Self-pay | Admitting: Internal Medicine
